# Patient Record
Sex: Female | Born: 1950 | Race: White | Hispanic: No | Smoking: Former smoker
Health system: Southern US, Community
[De-identification: ages and names within clinical notes are randomized; demographics above are authoritative.]

## PROBLEM LIST (undated history)

## (undated) DIAGNOSIS — I10 Essential (primary) hypertension: Secondary | ICD-10-CM

## (undated) DIAGNOSIS — R519 Headache, unspecified: Secondary | ICD-10-CM

## (undated) DIAGNOSIS — Z789 Other specified health status: Secondary | ICD-10-CM

## (undated) DIAGNOSIS — C801 Malignant (primary) neoplasm, unspecified: Secondary | ICD-10-CM

## (undated) DIAGNOSIS — I509 Heart failure, unspecified: Secondary | ICD-10-CM

## (undated) DIAGNOSIS — M419 Scoliosis, unspecified: Secondary | ICD-10-CM

## (undated) DIAGNOSIS — I4891 Unspecified atrial fibrillation: Secondary | ICD-10-CM

## (undated) DIAGNOSIS — R51 Headache: Secondary | ICD-10-CM

## (undated) DIAGNOSIS — Z8669 Personal history of other diseases of the nervous system and sense organs: Secondary | ICD-10-CM

## (undated) HISTORY — DX: Unspecified atrial fibrillation: I48.91

## (undated) HISTORY — DX: Personal history of other diseases of the nervous system and sense organs: Z86.69

## (undated) HISTORY — PX: CARDIAC CATHETERIZATION: SHX172

## (undated) HISTORY — DX: Scoliosis, unspecified: M41.9

## (undated) HISTORY — DX: Heart failure, unspecified: I50.9

## (undated) HISTORY — DX: Malignant (primary) neoplasm, unspecified: C80.1

---

## 1999-04-29 ENCOUNTER — Other Ambulatory Visit: Admission: RE | Admit: 1999-04-29 | Discharge: 1999-04-29 | Payer: Self-pay | Admitting: Obstetrics & Gynecology

## 2000-06-17 ENCOUNTER — Other Ambulatory Visit: Admission: RE | Admit: 2000-06-17 | Discharge: 2000-06-17 | Payer: Self-pay | Admitting: Obstetrics & Gynecology

## 2003-04-11 ENCOUNTER — Other Ambulatory Visit: Admission: RE | Admit: 2003-04-11 | Discharge: 2003-04-11 | Payer: Self-pay | Admitting: Obstetrics & Gynecology

## 2003-09-23 HISTORY — PX: OOPHORECTOMY: SHX86

## 2004-09-30 ENCOUNTER — Other Ambulatory Visit: Admission: RE | Admit: 2004-09-30 | Discharge: 2004-09-30 | Payer: Self-pay | Admitting: Obstetrics & Gynecology

## 2004-10-09 ENCOUNTER — Ambulatory Visit (HOSPITAL_COMMUNITY): Admission: RE | Admit: 2004-10-09 | Discharge: 2004-10-09 | Payer: Self-pay | Admitting: Obstetrics & Gynecology

## 2005-01-06 ENCOUNTER — Ambulatory Visit (HOSPITAL_COMMUNITY): Admission: RE | Admit: 2005-01-06 | Discharge: 2005-01-06 | Payer: Self-pay | Admitting: Obstetrics & Gynecology

## 2006-02-09 ENCOUNTER — Emergency Department: Payer: Self-pay | Admitting: General Practice

## 2006-02-12 ENCOUNTER — Emergency Department: Payer: Self-pay | Admitting: Emergency Medicine

## 2006-02-16 ENCOUNTER — Emergency Department: Payer: Self-pay | Admitting: Emergency Medicine

## 2006-02-17 ENCOUNTER — Emergency Department: Payer: Self-pay | Admitting: Unknown Physician Specialty

## 2006-02-23 ENCOUNTER — Emergency Department: Payer: Self-pay | Admitting: Emergency Medicine

## 2006-03-02 ENCOUNTER — Emergency Department: Payer: Self-pay | Admitting: Unknown Physician Specialty

## 2009-03-14 DIAGNOSIS — D126 Benign neoplasm of colon, unspecified: Secondary | ICD-10-CM | POA: Insufficient documentation

## 2009-07-10 DIAGNOSIS — Z8639 Personal history of other endocrine, nutritional and metabolic disease: Secondary | ICD-10-CM | POA: Insufficient documentation

## 2011-10-05 ENCOUNTER — Emergency Department: Payer: Self-pay | Admitting: Emergency Medicine

## 2011-10-05 LAB — COMPREHENSIVE METABOLIC PANEL
Albumin: 3.9 g/dL (ref 3.4–5.0)
Anion Gap: 9 (ref 7–16)
Calcium, Total: 8.8 mg/dL (ref 8.5–10.1)
Chloride: 106 mmol/L (ref 98–107)
EGFR (African American): >60
Glucose: 110 mg/dL — ABNORMAL HIGH (ref 65–99)
Osmolality: 284 (ref 275–301)
Potassium: 3.7 mmol/L (ref 3.5–5.1)
Sodium: 141 mmol/L (ref 136–145)
Total Protein: 7.3 g/dL (ref 6.4–8.2)

## 2011-10-05 LAB — CBC
Platelet: 268 10*3/uL (ref 150–440)
RBC: 4.34 10*6/uL (ref 3.80–5.20)
RDW: 13.3 % (ref 11.5–14.5)
WBC: 6.3 10*3/uL (ref 3.6–11.0)

## 2011-12-09 ENCOUNTER — Other Ambulatory Visit: Payer: Self-pay

## 2017-01-01 ENCOUNTER — Other Ambulatory Visit: Payer: Self-pay | Admitting: Gastroenterology

## 2017-02-20 HISTORY — PX: CATARACT EXTRACTION: SUR2

## 2017-02-26 ENCOUNTER — Encounter (HOSPITAL_COMMUNITY): Payer: Self-pay

## 2017-03-03 ENCOUNTER — Encounter (HOSPITAL_COMMUNITY)
Admission: RE | Disposition: A | Discharge status: Home / Self Care | Payer: Self-pay | Source: Ambulatory Visit | Attending: Gastroenterology

## 2017-03-03 ENCOUNTER — Encounter (HOSPITAL_COMMUNITY): Payer: Self-pay | Admitting: *Deleted

## 2017-03-03 ENCOUNTER — Ambulatory Visit (HOSPITAL_COMMUNITY)
Admission: RE | Admit: 2017-03-03 | Discharge: 2017-03-03 | Disposition: A | Discharge status: Home / Self Care | Payer: Medicare HMO | Source: Ambulatory Visit | Attending: Gastroenterology | Admitting: Gastroenterology

## 2017-03-03 ENCOUNTER — Ambulatory Visit (HOSPITAL_COMMUNITY): Payer: Medicare HMO | Admitting: Anesthesiology

## 2017-03-03 DIAGNOSIS — Z888 Allergy status to other drugs, medicaments and biological substances status: Secondary | ICD-10-CM | POA: Insufficient documentation

## 2017-03-03 DIAGNOSIS — Z79899 Other long term (current) drug therapy: Secondary | ICD-10-CM | POA: Insufficient documentation

## 2017-03-03 DIAGNOSIS — Z880 Allergy status to penicillin: Secondary | ICD-10-CM | POA: Diagnosis not present

## 2017-03-03 DIAGNOSIS — Z1211 Encounter for screening for malignant neoplasm of colon: Secondary | ICD-10-CM | POA: Insufficient documentation

## 2017-03-03 DIAGNOSIS — D120 Benign neoplasm of cecum: Secondary | ICD-10-CM | POA: Insufficient documentation

## 2017-03-03 DIAGNOSIS — D122 Benign neoplasm of ascending colon: Secondary | ICD-10-CM | POA: Insufficient documentation

## 2017-03-03 DIAGNOSIS — Z86010 Personal history of colonic polyps: Secondary | ICD-10-CM | POA: Insufficient documentation

## 2017-03-03 DIAGNOSIS — Z87891 Personal history of nicotine dependence: Secondary | ICD-10-CM | POA: Insufficient documentation

## 2017-03-03 DIAGNOSIS — Z881 Allergy status to other antibiotic agents status: Secondary | ICD-10-CM | POA: Diagnosis not present

## 2017-03-03 HISTORY — DX: Headache: R51

## 2017-03-03 HISTORY — DX: Other specified health status: Z78.9

## 2017-03-03 HISTORY — PX: COLONOSCOPY WITH PROPOFOL: SHX5780

## 2017-03-03 HISTORY — DX: Headache, unspecified: R51.9

## 2017-03-03 SURGERY — COLONOSCOPY WITH PROPOFOL
Anesthesia: Monitor Anesthesia Care

## 2017-03-03 MED ORDER — PROPOFOL 10 MG/ML IV BOLUS
INTRAVENOUS | Status: AC
  Filled 2017-03-03: qty 40

## 2017-03-03 MED ORDER — PROPOFOL 500 MG/50ML IV EMUL
INTRAVENOUS | Status: DC | PRN
Start: 2017-03-03 — End: 2017-03-03
  Administered 2017-03-03: 200 ug/kg/min via INTRAVENOUS

## 2017-03-03 MED ORDER — ONDANSETRON HCL 4 MG/2ML IJ SOLN
INTRAMUSCULAR | Status: DC | PRN
  Administered 2017-03-03: 4 mg via INTRAVENOUS

## 2017-03-03 MED ORDER — SODIUM CHLORIDE 0.9 % IV SOLN: INTRAVENOUS | Status: DC

## 2017-03-03 MED ORDER — ONDANSETRON HCL 4 MG/2ML IJ SOLN
INTRAMUSCULAR | Status: AC
  Filled 2017-03-03: qty 2

## 2017-03-03 MED ORDER — LACTATED RINGERS IV SOLN
INTRAVENOUS | Status: DC
  Administered 2017-03-03: 11:00:00 via INTRAVENOUS

## 2017-03-03 MED ORDER — PROPOFOL 10 MG/ML IV BOLUS
INTRAVENOUS | Status: DC | PRN
  Administered 2017-03-03: 20 mg via INTRAVENOUS
  Administered 2017-03-03: 10 mg via INTRAVENOUS

## 2017-03-03 SURGICAL SUPPLY — 21 items

## 2017-03-03 NOTE — Anesthesia Preprocedure Evaluation (Signed)
Anesthesia Evaluation  Patient identified by MRN, date of birth, ID band Patient awake    Reviewed: Allergy & Precautions, NPO status , Patient's Chart, lab work & pertinent test results  Airway Mallampati: II  TM Distance: >3 FB Neck ROM: Full    Dental no notable dental hx.    Pulmonary neg pulmonary ROS, former smoker,    Pulmonary exam normal breath sounds clear to auscultation       Cardiovascular negative cardio ROS Normal cardiovascular exam Rhythm:Regular Rate:Normal     Neuro/Psych negative neurological ROS  negative psych ROS   GI/Hepatic negative GI ROS, Neg liver ROS,   Endo/Other  negative endocrine ROS  Renal/GU negative Renal ROS  negative genitourinary   Musculoskeletal negative musculoskeletal ROS (+)   Abdominal   Peds negative pediatric ROS (+)  Hematology negative hematology ROS (+)   Anesthesia Other Findings   Reproductive/Obstetrics negative OB ROS                             Anesthesia Physical Anesthesia Plan  ASA: I  Anesthesia Plan: MAC   Post-op Pain Management:    Induction: Intravenous  PONV Risk Score and Plan:   Airway Management Planned: Nasal Cannula  Additional Equipment:   Intra-op Plan:   Post-operative Plan:   Informed Consent: I have reviewed the patients History and Physical, chart, labs and discussed the procedure including the risks, benefits and alternatives for the proposed anesthesia with the patient or authorized representative who has indicated his/her understanding and acceptance.   Dental advisory given  Plan Discussed with: CRNA and Surgeon  Anesthesia Plan Comments:         Anesthesia Quick Evaluation

## 2017-03-03 NOTE — Op Note (Signed)
[facility] Patient Name: Doris [name] Procedure Date: 03/03/2017 MRN: [id number] Attending MD: Garlan Fair , MD Date of Birth: [birth year] CSN: [id number] Age: 66 Admit Type: Outpatient Procedure:                Colonoscopy Indications:              High risk colon cancer surveillance: 2010                            colonoscopy was performed with removal of a 3 mm                            adenomatous colon polyp. 05/19/2012 normal                            surveillance colonoscopy was performed. Providers:                Garlan Fair, MD, Burtis Junes, RN, Tinnie Gens,                            Technician Referring MD:              Medicines:                Propofol per Anesthesia Complications:            No immediate complications. Estimated Blood Loss:     Estimated blood loss was minimal. Procedure:                Pre-Anesthesia Assessment:                           - Prior to the procedure, a History and Physical                            was performed, and patient medications and                            allergies were reviewed. The patient's tolerance of                            previous anesthesia was also reviewed. The risks                            and benefits of the procedure and the sedation                            options and risks were discussed with the patient.                            All questions were answered, and informed consent                            was obtained. Prior Anticoagulants: The patient has  taken no previous anticoagulant or antiplatelet                            agents. ASA Grade Assessment: II - A patient with                            mild systemic disease. After reviewing the risks                            and benefits, the patient was deemed in                            satisfactory condition to undergo the procedure.                           After obtaining  informed consent, the colonoscope                            was passed under direct vision. Throughout the                            procedure, the patient's blood pressure, pulse, and                            oxygen saturations were monitored continuously. The                            EC-[id number] ([id number]) scope was introduced through                            the anus and advanced to the the cecum, identified                            by appendiceal orifice and ileocecal valve. The                            colonoscopy was technically difficult and complex                            due to significant looping. The patient tolerated                            the procedure well. The quality of the bowel                            preparation was good. The appendiceal orifice and                            the rectum were photographed. Scope In: 11:31:12 AM Scope Out: 11:57:06 AM Scope Withdrawal Time: 0 hours 11 minutes 14 seconds  Total Procedure Duration: 0 hours 25 minutes 54 seconds  Findings:      The perianal and digital rectal examinations were normal.      A 4 mm polyp was found  in the cecum. The polyp was sessile. The polyp       was removed with a cold snare. Resection and retrieval were complete.      A 7 mm sessile serrated appearing polyp was found in the mid ascending       colon. The polyp was removed with a cold snare. Resection and retrieval       were complete.      The exam was otherwise without abnormality. Impression:               - One 4 mm polyp in the cecum, removed with a cold                            snare. Resected and retrieved.                           - One 7 mm polyp in the mid ascending colon,                            removed with a cold snare. Resected and retrieved.                           - The examination was otherwise normal. Moderate Sedation:      N/A- Per Anesthesia Care Recommendation:           - Patient has a contact number  available for                            emergencies. The signs and symptoms of potential                            delayed complications were discussed with the                            patient. Return to normal activities tomorrow.                            Written discharge instructions were provided to the                            patient.                           - Repeat colonoscopy date to be determined after                            pending pathology results are reviewed for                            surveillance.                           - Resume previous diet.                           - Continue present medications. Procedure Code(s):        ---  Professional ---                           [phone], Colonoscopy, flexible; with removal of                            tumor(s), polyp(s), or other lesion(s) by snare                            technique Diagnosis Code(s):        --- Professional ---                           Z86.010, Personal history of colonic polyps                           D12.0, Benign neoplasm of cecum                           D12.2, Benign neoplasm of ascending colon CPT copyright 2016 American Medical Association. All rights reserved. The codes documented in this report are preliminary and upon coder review may  be revised to meet current compliance requirements. Earle Gell, MD Garlan Fair, MD 03/03/2017 12:04:33 PM This report has been signed electronically. Number of Addenda: 0

## 2017-03-03 NOTE — Transfer of Care (Signed)
Immediate Anesthesia Transfer of Care Note  Patient: Doris [name]  Procedure(s) Performed: Procedure(s): COLONOSCOPY WITH PROPOFOL (N/A)  Patient Location: PACU  Anesthesia Type:MAC  Level of Consciousness:  sedated, patient cooperative and responds to stimulation  Airway & Oxygen Therapy:Patient Spontanous Breathing and Patient connected to face mask oxgen  Post-op Assessment:  Report given to PACU RN and Post -op Vital signs reviewed and stable  Post vital signs:  Reviewed and stable  Last Vitals:  Vitals:   03/03/17 1035  BP: 124/62  Pulse: 78  Resp: 15  Temp: 58.6 C    Complications: No apparent anesthesia complications

## 2017-03-03 NOTE — Discharge Instructions (Signed)
Colonoscopy, Adult, Care After This sheet gives you information about how to care for yourself after your procedure. Your doctor may also give you more specific instructions. If you have problems or questions, call your doctor. Follow these instructions at home: General instructions   For the first 24 hours after the procedure: ? Do not drive or use machinery. ? Do not sign important documents. ? Do not drink alcohol. ? Do your daily activities more slowly than normal. ? Eat foods that are soft and easy to digest. ? Rest often.  Take over-the-counter or prescription medicines only as told by your doctor.  It is up to you to get the results of your procedure. Ask your doctor, or the department performing the procedure, when your results will be ready. To help cramping and bloating:  Try walking around.  Put heat on your belly (abdomen) as told by your doctor. Use a heat source that your doctor recommends, such as a moist heat pack or a heating pad. ? Put a towel between your skin and the heat source. ? Leave the heat on for 20-30 minutes. ? Remove the heat if your skin turns bright red. This is especially important if you cannot feel pain, heat, or cold. You can get burned. Eating and drinking  Drink enough fluid to keep your pee (urine) clear or pale yellow.  Return to your normal diet as told by your doctor. Avoid heavy or fried foods that are hard to digest.  Avoid drinking alcohol for as long as told by your doctor. Contact a doctor if:  You have blood in your poop (stool) 2-3 days after the procedure. Get help right away if:  You have more than a small amount of blood in your poop.  You see large clumps of tissue (blood clots) in your poop.  Your belly is swollen.  You feel sick to your stomach (nauseous).  You throw up (vomit).  You have a fever.  You have belly pain that gets worse, and medicine does not help your pain. This information is not intended to  replace advice given to you by your health care provider. Make sure you discuss any questions you have with your health care provider. Document Released: 10/11/2010 Document Revised: 06/02/2016 Document Reviewed: 06/02/2016 Elsevier Interactive Patient Education  2017 [place] HAD AN ENDOSCOPIC PROCEDURE TODAY: Refer to the procedure report and other information in the discharge instructions given to you for any specific questions about what was found during the examination. If this information does not answer your questions, please call Dr. Wynetta Emery office at [phone] to clarify.   YOU SHOULD EXPECT: Some feelings of bloating in the abdomen. Passage of more gas than usual. Walking can help get rid of the air that was put into your GI tract during the procedure and reduce the bloating. If you had a lower endoscopy (such as a colonoscopy or flexible sigmoidoscopy) you may notice spotting of blood in your stool or on the toilet paper. Some abdominal soreness may be present for a day or two, also.  DIET: Your first meal following the procedure should be a light meal and then it is ok to progress to your normal diet. A half-sandwich or bowl of soup is an example of a good first meal. Heavy or fried foods are harder to digest and may make you feel nauseous or bloated. Drink plenty of fluids but you should avoid alcoholic beverages for 24 hours. If you had a esophageal dilation, please  see attached instructions for diet.   ACTIVITY: Your care partner should take you home directly after the procedure. You should plan to take it easy, moving slowly for the rest of the day. You can resume normal activity the day after the procedure however YOU SHOULD NOT DRIVE, use power tools, machinery or perform tasks that involve climbing or major physical exertion for 24 hours (because of the sedation medicines used during the test).   SYMPTOMS TO REPORT IMMEDIATELY: A gastroenterologist can be reached at any  hour. Please call [phone] for any of the following symptoms:  Following lower endoscopy (colonoscopy, flexible sigmoidoscopy) Excessive amounts of blood in the stool  Significant tenderness, worsening of abdominal pains  Swelling of the abdomen that is new, acute  Fever of 100 or higher    FOLLOW UP:  If any biopsies were taken you will be contacted by phone or by letter within the next 1-3 weeks. Call [phone]  if you have not heard about the biopsies in 3 weeks.  Please also call with any specific questions about appointments or follow up tests.

## 2017-03-03 NOTE — Anesthesia Postprocedure Evaluation (Signed)
Anesthesia Post Note  Patient: Doris [name]  Procedure(s) Performed: Procedure(s) (LRB): COLONOSCOPY WITH PROPOFOL (N/A)     Patient location during evaluation: PACU Anesthesia Type: MAC Level of consciousness: awake and alert Pain management: pain level controlled Vital Signs Assessment: post-procedure vital signs reviewed and stable Respiratory status: spontaneous breathing, nonlabored ventilation, respiratory function stable and patient connected to nasal cannula oxygen Cardiovascular status: stable and blood pressure returned to baseline Anesthetic complications: no    Last Vitals:  Vitals:   03/03/17 1220 03/03/17 1230  BP: 103/60 (!) 112/55  Pulse: 63 (!) 59  Resp: 14 16  Temp:      Last Pain:  Vitals:   03/03/17 1201  TempSrc: Oral                 [name]

## 2017-03-03 NOTE — H&P (Signed)
Procedure: Surveillance colonoscopy. 05/19/2012 normal surveillance colonoscopy was performed. 2010 colonoscopy was performed with removal of a 3 mm adenomatous colon polyp  History: The patient is a 66 year old female born 27-Dec-1950. She is scheduled to undergo a surveillance colonoscopy today.  Medication allergies: Penicillin. Metronidazole. Biaxin. Lidocaine.  Past medical history: Bilateral salpingo-oophorectomy. Migraine headache syndrome.  Exam: The patient is lying comfortably on the endoscopy stretcher. Cardiac exam reveals a regular rhythm. Lungs are clear to auscultation. Abdomen is soft and nontender to palpation.  Plan: Proceed with surveillance colonoscopy

## 2017-03-04 ENCOUNTER — Encounter (HOSPITAL_COMMUNITY): Payer: Self-pay | Admitting: Gastroenterology

## 2017-12-17 ENCOUNTER — Encounter: Payer: Self-pay | Admitting: Physical Therapy

## 2017-12-17 ENCOUNTER — Other Ambulatory Visit: Payer: Self-pay

## 2017-12-17 ENCOUNTER — Ambulatory Visit: Payer: Medicare HMO | Attending: Obstetrics and Gynecology | Admitting: Physical Therapy

## 2017-12-17 DIAGNOSIS — M4125 Other idiopathic scoliosis, thoracolumbar region: Secondary | ICD-10-CM

## 2017-12-17 DIAGNOSIS — M25511 Pain in right shoulder: Secondary | ICD-10-CM | POA: Diagnosis present

## 2017-12-17 DIAGNOSIS — M533 Sacrococcygeal disorders, not elsewhere classified: Secondary | ICD-10-CM | POA: Diagnosis not present

## 2017-12-17 DIAGNOSIS — M2170 Unequal limb length (acquired), unspecified site: Secondary | ICD-10-CM

## 2017-12-17 DIAGNOSIS — G8929 Other chronic pain: Secondary | ICD-10-CM | POA: Diagnosis present

## 2017-12-17 NOTE — Therapy (Addendum)
[place] [facility]IN [facility] SERVICES [address] [place], [place], [address] Phone: [phone]   Fax:  [phone]  Physical Therapy Evaluation  Patient Details  Name: Doris [name] MRN: [id number] Date of Birth: 11/26/1950 Referring Provider: Sharlett Iles    Encounter Date: 12/17/2017  PT End of Session - 12/17/17 2322    Visit Number  1    Number of Visits  12    PT Start Time  5361    PT Stop Time  1217    PT Time Calculation (min)  62 min    Activity Tolerance  Patient tolerated treatment well;No increased pain    Behavior During Therapy  WFL for tasks assessed/performed       Past Medical History:  Diagnosis Date  . Cancer (HCC)    Basal cell ( bridge of her nose)   . Headache    Migraines  . Hx of migraine headaches   . Medical history non-contributory    Mitral valve prolapse at 60 yrs of age    Past Surgical History:  Procedure Laterality Date  . CATARACT EXTRACTION  02/2017  . COLONOSCOPY WITH PROPOFOL N/A 03/03/2017   Procedure: COLONOSCOPY WITH PROPOFOL;  Surgeon: Garlan Fair, MD;  Location: [facility] ENDOSCOPY;  Service: Endoscopy;  Laterality: N/A;  . OOPHORECTOMY  2005    There were no vitals filed for this visit.   Subjective Assessment - 12/17/17 1127    Subjective Pelvic pain began  a year ago July 2018  when she was dragging a bunch of leaves in her garden. pain in the R bottom glut area and R groin 4/10 and currently, 3/10.  Pt was misdiagnosed by two orthopedist and 1 physical therapist.  Pt saw a Pelvic PT March 2019 for one visit. Pt sought out Pelvic PT here at [facility] because she did not want to travel 1 hour away each way through an internet search.  Sometimes the pain travels down the R posterior mid thigh after doing PT exercise her last Pelvic PT gave her. Currently this pain is pretty constant and sometimes it gets better during the day. It is there 85% of the time. Aggravating factors:  climbing a ladder,  digging, walking more than 1 mi .  Easing factors: masturbation, wearing the right shoes. This pain no longer wakes her up at night. Hobbie: gardening, walking 2 miles, swimming 1/2 mile    Urinary issue: Pt had difficulty with completely voiding after childbirth many years ago which involved an episiotomy. It has become worst after her gardening injury. It takes longer to start urination more recently within the last month or 6 weeks.  Sometimes she had low back pain prior to the injury. Urinary leakage occurs with sneezing.  Pt is doing kegels everyday 20 sec. Pt learned online how to do kegels.   Bowel issue: Pt noticed her bowels dramatically changed 3-4 weeks ago where sometimes, she could not completely empty her bowel movement without manually assisting. Sometimes, she would have to push the area to start bowel movements but in the last few days since she started changing her diet and doing some PT exercises.  Pt still has to strain to eliminate.  Denied blood in her stools, nausea, vomiting.   R shoulder pain: Pt has had this pain since her 51s.     Pertinent History    Patient Stated Goals  climbing a ladder. walking 2 miles          [facility]  PT Assessment - 12/17/17 1155      Assessment   Medical Diagnosis  levator spasm     Referring Provider  Sharlett Iles       Precautions   Precautions  None      Restrictions   Weight Bearing Restrictions  No      Observation/Other Assessments   Observations  slumped sitting and standing, posterior tilt of pelvis.         Single Leg Stance   Comments  difficulty balancing on R SLS (  Improved stability post Tx)       Strength   Overall Strength  -- decreased glut mm  bulk   Overall Strength Comments  hip ext B 5/5       Palpation   Spinal mobility  L shoulder lower than R R thoracic throacic convex curve, L lumbar curve with R iliac crest higher than L in standing,      SI assessment   L ASIS more higher in supine     Palpation comment  R  malleoli to ASIS 84 cm, L 81.3 cm, Coccyx flexed, tenderness on R base/ B sacrocccoccygeal ligaments                 No data recorded  Objective measurements completed on examination: See above findings.      [facility] Adult PT Treatment/Exercise - 12/17/17 2256      Neuro Re-ed    Neuro Re-ed Details   see pt instructions       Manual Therapy   Manual therapy comments  superior glide B sacrococcygeal ligament              PT Education - 12/17/17 1224    Education provided  Yes    Education Details  POC,           PT Long Term Goals - 12/17/17 2259      PT LONG TERM GOAL #1   Title  Pt will decrease her NIH-CPSI score from 67% to < 50% in order to demo improved pelvic floor function and fully complete urination with no difficulty    Time  12    Period  Weeks    Status  New    Target Date  03/11/18      PT LONG TERM GOAL #2   Title  Pt will demo less flexed coccyx and no tenderness on B sacrococcygeal ligaments across 2 visits in order to climb a ladder and have less constipation     Time  4    Period  Weeks    Status  New    Target Date  01/14/18      PT LONG TERM GOAL #3   Title  Pt will be IND with scoliosis specific exercises in order to minimize relapse of Sx    Time  8    Period  Weeks    Status  New    Target Date  02/11/18      PT LONG TERM GOAL #4   Title  Pt will demo proper body mechanics with gardening, floor <> stand transfers, and alignment with deep core/ thoracolumbar/ lower kinetic chain strengthening with less cues in order to minimize risk for future injuries     Time  10    Period  Weeks    Status  New    Target Date  02/25/18      PT LONG TERM GOAL #5   Title  Pt will demo  levelled iliac crest in supine and stance across 2 visits in order to progress to deep core/ pelvic girdle strengthening     Time  2    Period  Weeks    Status  New    Target Date  12/31/17             Plan - 12/17/17 2323    Clinical Impression  Statement  Pt is 67 yo female who reports of pelvic floor dysfunction and R shoulder pain. Her pelvic pain/ posterior R glut pain that started 1 year ago after pulling leaves in her garden. Since then, pt has also noticed difficulty with completely urination had worsened. This urinary Sx had been a problem since childbirth during which she had an epsiotomy. For the past 3-4 weeks, pt has difficulty with completely emptying her bowel movement without manually assisting.  Pt has been misdiagnosed by multiple providers and had failed PT Tx. These Sx have limited her ability to climb a ladder, garden, and walk for > 2 mi, and also impacted her QOL. Pt also reported R shoulder pain which she has had for many years, unrelated to the gardening injury.   Pt's clinical presentation showed thoracolumbar scoliosis with associated leg length discrepancy ( R LE shorter) and pelvic obliquities. Pt also presented with a flexed coccyx, weakness to deep core, posterior tilt of her pelvis.  Suspect pt 's R chronic shoulder pain is likely related to her scoliosis. Plan to assess upper spine/ scapular motion and  pelvic floor  at upcoming visits.Following Tx today, pt's iliac crest were more aligned in standing after pt was provided a shoe lift in her R shoe. Manual Tx was applied on coccyx to promote more extension and pt reported less tenderness at B sacrococcgyeal ligaments and R base of coccyx post Tx. Pt will benefit from a regional interdependent approach given the impact of her scoliosis. Pt had not be informed about her scoliosis nor leg length difference in her past. Further communication with her providers would be beneficial.  Pelvic PT ( Dr. Mechele Claude),  Dr. Irish Elders ( [facility] MD) who was the first provider who suggested pt had a pelvic floor dysfunction, and with Dr. Sharlett Iles (urogynecologist), her referring provider.          Clinical Presentation  Evolving    Clinical Decision Making  High    Rehab  Potential  Good    Clinical Impairments Affecting Rehab Potential  flexed coccyx, pelvic obliquities, scoliosis, leg length difference, posterior tilt of pelvis,  Hx of 2 vaginal deliveries, misdiagnosed by multiple providers and failed PT Tx     PT Frequency  1x / week    PT Duration  12 weeks    PT Treatment/Interventions  Therapeutic activities;Neuromuscular re-education;Therapeutic exercise;Moist Heat;Gait training;Patient/family education;Manual techniques;Scar mobilization;Manual lymph drainage;Aquatic Therapy;Biofeedback;Traction;Electrical Stimulation;Energy conservation;Taping;Stair training;Balance training;Functional mobility training    Consulted and Agree with Plan of Care  Patient       Patient will benefit from skilled therapeutic intervention in order to improve the following deficits and impairments:  Abnormal gait, Decreased balance, Decreased endurance, Decreased mobility, Increased muscle spasms, Impaired sensation, Difficulty walking, Improper body mechanics, Decreased range of motion, Decreased activity tolerance, Decreased coordination, Decreased safety awareness, Decreased strength, Increased fascial restricitons, Impaired flexibility, Postural dysfunction, Pain, Decreased scar mobility  Visit Diagnosis: Sacrococcygeal disorders, not elsewhere classified  Leg length difference, acquired  Other idiopathic scoliosis, thoracolumbar region  Chronic right shoulder pain     Problem List There are  no active problems to display for this patient.   Jerl Mina ,PT, DPT, E-RYT  12/18/2017, 12:03 AM  [place] MAIN [facility] SERVICES [address] [place], [place], [address] Phone: [phone]   Fax:  [phone]  Name: HARLEI LEHRMANN MRN: [id number] Date of Birth: [birth year]

## 2017-12-17 NOTE — Patient Instructions (Signed)
     Frog stretch: laying on belly with pillow under hips, knees bent, inhale do nothing, exhale let ankles fall apart   15 reps x 2 x day    childs pose rocking, toes tucked under  5 reps

## 2017-12-23 ENCOUNTER — Ambulatory Visit: Payer: Medicare HMO | Attending: Obstetrics and Gynecology | Admitting: Physical Therapy

## 2017-12-23 DIAGNOSIS — M2170 Unequal limb length (acquired), unspecified site: Secondary | ICD-10-CM | POA: Diagnosis not present

## 2017-12-23 DIAGNOSIS — M533 Sacrococcygeal disorders, not elsewhere classified: Secondary | ICD-10-CM | POA: Diagnosis present

## 2017-12-23 DIAGNOSIS — M4125 Other idiopathic scoliosis, thoracolumbar region: Secondary | ICD-10-CM

## 2017-12-23 DIAGNOSIS — G8929 Other chronic pain: Secondary | ICD-10-CM | POA: Insufficient documentation

## 2017-12-23 DIAGNOSIS — M25511 Pain in right shoulder: Secondary | ICD-10-CM | POA: Insufficient documentation

## 2017-12-23 NOTE — Patient Instructions (Addendum)
     Frog stretch: laying on belly with pillow under hips, knees bent, inhale do nothing, exhale let ankles fall apart 15 deg  15 reps x 3    _______      Standing:  10 reps on both sides x 3 x day     3 point tap   Feet are hip width Tap forward, center under hip not feet next to each other  Tap middle\, center  Tap back      _ Sitting:  No more slouching  Feet on floor  Sitting on sitting bones ( ischial tuberosities)  Use pelvic tilts to find pelvic neutral and not tailbone tucked under   _  Standing posture  Shift navel forward slightly, shift weight across heel and ball mound, unlock knees  Neutral spine and pelvic alignment (like zipping a zipper)    Laying on your back:  find pelvic neutral by bridging up first , then lower ribs and lastly, your pelvis    _____  Swimming: modify with more back stroke, breast stroke and less freestyle   Walking on land 15 min x 2 at different time of the day

## 2017-12-23 NOTE — Therapy (Signed)
[place] MAIN [facility] SERVICES [address] [place], [place], [address] Phone: [phone]   Fax:  [phone]  Physical Therapy Treatment  Patient Details  Name: Doris [name] MRN: [id number] Date of Birth: [birth year] Referring Provider: Sharlett Iles    Encounter Date: 12/23/2017  PT End of Session - 12/23/17 1022    Visit Number  2    Number of Visits  12    PT Start Time  7106    PT Stop Time  2694    PT Time Calculation (min)  63 min    Activity Tolerance  Patient tolerated treatment well;No increased pain    Behavior During Therapy  WFL for tasks assessed/performed       Past Medical History:  Diagnosis Date  . Cancer (HCC)    Basal cell ( bridge of her nose)   . Headache    Migraines  . Hx of migraine headaches   . Medical history non-contributory    Mitral valve prolapse at 58 yrs of age    Past Surgical History:  Procedure Laterality Date  . CATARACT EXTRACTION  02/2017  . COLONOSCOPY WITH PROPOFOL N/A 03/03/2017   Procedure: COLONOSCOPY WITH PROPOFOL;  Surgeon: Garlan Fair, MD;  Location: [facility];  Service: Endoscopy;  Laterality: N/A;  . OOPHORECTOMY  2005    There were no vitals filed for this visit.  Subjective Assessment - 12/23/17 0913    Subjective  Pt reported her tailbone pain felt good after last session and went to rake leaves in her garden. After raking her leaves, pt felt pain after the end of raking.      Pertinent History  Denied blood in her stools, nausea, vomiting. This pain no longer wakes her up at night. Hobbie: gardening, walking 2 miles, swimming 1/2 mile     Patient Stated Goals  climbing a ladder. walking 2 miles          [facility] PT Assessment - 12/23/17 2048      Observation/Other Assessments   Observations  adducted genu hallus B       Strength   Overall Strength Comments  prone: hip ext R 4-/5, L 5/5       Palpation   SI assessment   ASIS equal in supine    Palpation  comment  coccyx flexed, increased tightenss/ tenderness at obt int and medial aspect of ischial tuberosity. referred to groin with palpation at lateral to coccyx        simulated raking : excessive trunk flexion , minimal LE use             [facility] Adult PT Treatment/Exercise - 12/23/17 2048      Neuro Re-ed    Neuro Re-ed Details   see pt instructions       Manual Therapy   Manual therapy comments  superior glide B sacrococcygeal ligament, STM at obt int/ medial ischial tubosity      Kinesiotex  -- 3 strips from coccyx              PT Education - 12/23/17 1022    Education provided  Yes    Education Details  HEP, body mechanics with raking    Person(s) Educated  Patient    Methods  Explanation;Demonstration;Tactile cues;Verbal cues;Handout    Comprehension  Returned demonstration;Verbalized understanding;Verbal cues required;Tactile cues required          PT Long Term Goals - 12/23/17 2052  PT LONG TERM GOAL #1   Title  Pt will decrease her NIH-CPSI score from 67% to < 50% in order to demo improved pelvic floor function and fully complete urination with no difficulty    Time  12    Period  Weeks    Status  New      PT LONG TERM GOAL #2   Title  Pt will demo less flexed coccyx and no tenderness on B sacrococcygeal ligaments across 2 visits in order to climb a ladder and have less constipation     Time  4    Period  Weeks    Status  New      PT LONG TERM GOAL #3   Title  Pt will be IND with scoliosis specific exercises in order to minimize relapse of Sx    Time  8    Period  Weeks    Status  New      PT LONG TERM GOAL #4   Title  Pt will demo proper body mechanics with gardening, floor <> stand transfers, and alignment with deep core/ thoracolumbar/ lower kinetic chain strengthening with less cues in order to minimize risk for future injuries     Time  10    Period  Weeks    Status  New      PT LONG TERM GOAL #5   Title  Pt will demo levelled  iliac crest in supine and stance across 2 visits in order to progress to deep core/ pelvic girdle strengthening     Time  2    Period  Weeks    Status  New      Additional Long Term Goals   Additional Long Term Goals  Yes      PT LONG TERM GOAL #6   Title  Pt will demo no referred pain with palpation along lateral areas of coccyx and less flexed coccyx across 2 visits in order to perform from  floor to stand  to garden     Time  6    Period  Weeks    Status  New    Target Date  02/03/18            Plan - 12/23/17 2048    Clinical Impression Statement  Ptis progressing well  with good carry over from last session w/ equal alignment of her iliac crest with shoe lift and improved gait. After today's manual Tx which pt tolerated without complaints, pt showed a less flexed coccyx and less coccygeus/ obturator internus/ mm attachments to medial aspect of ischial tuberosities. Pt reported feeling better post Tx. Pt was educated on  proper sitting and standing posture to minimize posterior tilt of pelvis and proper body mechanics with raking with more BLE use/ less spinal flexion.  Pt demo'd correct posture. Pt continues to benefit from skilled PT.      Rehab Potential  Good    Clinical Impairments Affecting Rehab Potential  flexed coccyx, pelvic obliquities, scoliosis, leg length difference, posterior tilt of pelvis,  Hx of 2 vaginal deliveries, misdiagnosed by multiple providers and failed PT Tx     PT Frequency  1x / week    PT Duration  12 weeks    PT Treatment/Interventions  Therapeutic activities;Neuromuscular re-education;Therapeutic exercise;Moist Heat;Gait training;Patient/family education;Manual techniques;Scar mobilization;Manual lymph drainage;Aquatic Therapy;Biofeedback;Traction;Electrical Stimulation;Energy conservation;Taping;Stair training;Balance training;Functional mobility training    Consulted and Agree with Plan of Care  Patient       Patient will benefit from  skilled  therapeutic intervention in order to improve the following deficits and impairments:  Abnormal gait, Decreased balance, Decreased endurance, Decreased mobility, Increased muscle spasms, Impaired sensation, Difficulty walking, Improper body mechanics, Decreased range of motion, Decreased activity tolerance, Decreased coordination, Decreased safety awareness, Decreased strength, Increased fascial restricitons, Impaired flexibility, Postural dysfunction, Pain, Decreased scar mobility  Visit Diagnosis: Leg length difference, acquired  Other idiopathic scoliosis, thoracolumbar region  Sacrococcygeal disorders, not elsewhere classified  Chronic right shoulder pain     Problem List There are no active problems to display for this patient.   Jerl Mina ,PT, DPT, E-RYT  12/23/2017, 8:54 PM  [place] MAIN [facility] SERVICES [address] [place], [place], [address] Phone: [phone]   Fax:  [phone]  Name: KORDELIA SEVERIN MRN: [id number] Date of Birth: [birth year]

## 2017-12-29 ENCOUNTER — Ambulatory Visit: Payer: Medicare HMO | Admitting: Physical Therapy

## 2017-12-29 DIAGNOSIS — M4125 Other idiopathic scoliosis, thoracolumbar region: Secondary | ICD-10-CM

## 2017-12-29 DIAGNOSIS — G8929 Other chronic pain: Secondary | ICD-10-CM

## 2017-12-29 DIAGNOSIS — M25511 Pain in right shoulder: Secondary | ICD-10-CM

## 2017-12-29 DIAGNOSIS — M2170 Unequal limb length (acquired), unspecified site: Secondary | ICD-10-CM

## 2017-12-29 DIAGNOSIS — M533 Sacrococcygeal disorders, not elsewhere classified: Secondary | ICD-10-CM

## 2017-12-29 NOTE — Patient Instructions (Signed)
half kneeling transitions to and from the floor  Add mini standing lunges with hand on dresser or table  20 x reps   Start with ski track stance, step back with heel/ toes turned straight, front knee above ankle without moving as your back knee bends and straightens  Keep pelvis and trunk centered between legs without shifting more to the front legs    __ also the technique for picking up sticks            Transition from standing to floor: Wide squat like you are about to pick something up from the floor --> hands on the ground (all fours)  To get up, all fours--> lifts hips in to Downward Facing Dog  and walk hands backwards to feet --> mini quat --> hands on thighs, then hips then pause to avoid (moving too quickly up/ blood rush) -->  knees glide forward and roll  Hips up instead of hinging spine up   ______________   Scoliosis stretch  Wall : Stand perpendicular with R upper arm distance away  R forearm against the wall and slide up and down, R hip shifts to lengthen the lower R flank   10 x 2    Seated shoulder roll backwards 10 x 2  _______   Pelvic tilts ( anterior) tipping forward  ,while on toilet  Breathing 360 degrees at ribs

## 2017-12-30 NOTE — Therapy (Addendum)
[place] MAIN [facility] SERVICES [address] [place], [place], [address] Phone: [phone]   Fax:  [phone]  Physical Therapy Treatment  Patient Details  Name: Doris [name] MRN: [id number] Date of Birth: Jan 14, 1951 Referring Provider: Sharlett Iles    Encounter Date: 12/29/2017  PT End of Session - 12/30/17 0823    Visit Number  3    Number of Visits  12    PT Start Time  0805    PT Stop Time  0915    PT Time Calculation (min)  70 min    Activity Tolerance  Patient tolerated treatment well;No increased pain    Behavior During Therapy  WFL for tasks assessed/performed       Past Medical History:  Diagnosis Date  . Cancer (HCC)    Basal cell ( bridge of her nose)   . Headache    Migraines  . Hx of migraine headaches   . Medical history non-contributory    Mitral valve prolapse at 58 yrs of age    Past Surgical History:  Procedure Laterality Date  . CATARACT EXTRACTION  02/2017  . COLONOSCOPY WITH PROPOFOL N/A 03/03/2017   Procedure: COLONOSCOPY WITH PROPOFOL;  Surgeon: Garlan Fair, MD;  Location: [facility];  Service: Endoscopy;  Laterality: N/A;  . OOPHORECTOMY  2005    There were no vitals filed for this visit.  Subjective Assessment - 12/29/17 0912    Subjective  Pt reported she "feels like she is getting my old self back".  With bowel movements, she is not having to use her hands to pushthrough out the entire bowel movement. She needs to push only at the last bit of the bowel movement     Pertinent History  Denied blood in her stools, nausea, vomiting. This pain no longer wakes her up at night. Hobbie: gardening, walking 2 miles, swimming 1/2 mile     Patient Stated Goals  climbing a ladder. walking 2 miles          [facility] PT Assessment - 12/30/17 0817      Floor to Stand   Comments  mild difficulty, fast transition up, anterior COM on half kneeling      Palpation   Spinal mobility  R medial scap mm tightness,  B paraspinals, intercostals  mm     SI assessment   ASIS equal in supine      quarter coin size hard nodule noted at L armpit              [facility] Adult PT Treatment/Exercise - 12/30/17 0816      Neuro Re-ed    Neuro Re-ed Details   see pt instructions       Manual Therapy   Scapular Mobilization  STM R medial scapula STM releases                   PT Long Term Goals - 12/23/17 2052      PT LONG TERM GOAL #1   Title  Pt will decrease her NIH-CPSI score from 67% to < 50% in order to demo improved pelvic floor function and fully complete urination with no difficulty    Time  12    Period  Weeks    Status  New      PT LONG TERM GOAL #2   Title  Pt will demo less flexed coccyx and no tenderness on B sacrococcygeal ligaments across 2 visits in order to climb a  ladder and have less constipation     Time  4    Period  Weeks    Status  New      PT LONG TERM GOAL #3   Title  Pt will be IND with scoliosis specific exercises in order to minimize relapse of Sx    Time  8    Period  Weeks    Status  New      PT LONG TERM GOAL #4   Title  Pt will demo proper body mechanics with gardening, floor <> stand transfers, and alignment with deep core/ thoracolumbar/ lower kinetic chain strengthening with less cues in order to minimize risk for future injuries     Time  10    Period  Weeks    Status  New      PT LONG TERM GOAL #5   Title  Pt will demo levelled iliac crest in supine and stance across 2 visits in order to progress to deep core/ pelvic girdle strengthening     Time  2    Period  Weeks    Status  New      Additional Long Term Goals   Additional Long Term Goals  Yes      PT LONG TERM GOAL #6   Title  Pt will demo no referred pain with palpation along lateral areas of coccyx and less flexed coccyx across 2 visits in order to perform from  floor to stand  to garden     Time  6    Period  Weeks    Status  New    Target Date  02/03/18             Plan - 12/30/17 2841    Clinical Impression Statement  Pt is progressing well with report of return to walking and "feeling like her old self again". Pt demo'd improved coccyx and pelvic girdle alignment. Addressed scoliotic curve deficits at thoracic spine with increased mm tensions near R shoulder.  Initiated scapular stabilizing exercises and provided back stroke swimming technique training. Recommended pt to alternate between freestyle swimming with back stroke in order to minimize overuse of mm and optimize scapular stability. Pt demo'd correctly. Pt demo'd decreased mm tensions and mobility in her thoracic spsine post Tx. Addressed body mechanics with floor to stand t/f to apply with yardwork. Pt demo'd no difficulty with half kneeling technique but had mild difficulty with 4-point transfer from hands and feet to standing. Pt demo'd correctly following training.  Pt continues to benefit from skilled PT.     Noted quarter coin size hard nodule at L armpit. PT left a message at the office of  PCP on 01/01/18 ( Dr. [name])  re: this finding with request for assessment and imaging.      Rehab Potential  Good    Clinical Impairments Affecting Rehab Potential  flexed coccyx, pelvic obliquities, scoliosis, leg length difference, posterior tilt of pelvis,  Hx of 2 vaginal deliveries, misdiagnosed by multiple providers and failed PT Tx     PT Frequency  1x / week    PT Duration  12 weeks    PT Treatment/Interventions  Therapeutic activities;Neuromuscular re-education;Therapeutic exercise;Moist Heat;Gait training;Patient/family education;Manual techniques;Scar mobilization;Manual lymph drainage;Aquatic Therapy;Biofeedback;Traction;Electrical Stimulation;Energy conservation;Taping;Stair training;Balance training;Functional mobility training    Consulted and Agree with Plan of Care  Patient       Patient will benefit from skilled therapeutic intervention in order to improve the following  deficits and impairments:  Abnormal  gait, Decreased balance, Decreased endurance, Decreased mobility, Increased muscle spasms, Impaired sensation, Difficulty walking, Improper body mechanics, Decreased range of motion, Decreased activity tolerance, Decreased coordination, Decreased safety awareness, Decreased strength, Increased fascial restricitons, Impaired flexibility, Postural dysfunction, Pain, Decreased scar mobility  Visit Diagnosis: Leg length difference, acquired  Other idiopathic scoliosis, thoracolumbar region  Sacrococcygeal disorders, not elsewhere classified  Chronic right shoulder pain     Problem List There are no active problems to display for this patient.   Jerl Mina ,PT, DPT, E-RYT  12/30/2017, 10:53 AM  [place] MAIN [facility] SERVICES [address] [place], [place], [address] Phone: [phone]   Fax:  [phone]  Name: AUNICA DAUPHINEE MRN: [id number] Date of Birth: 1950-09-28

## 2018-01-01 ENCOUNTER — Telehealth: Payer: Self-pay | Admitting: Physical Therapy

## 2018-01-01 NOTE — Telephone Encounter (Signed)
PT left a message at Dr. Ammie Ferrier office ( PCP) re: hard nodule at L armpit and pt's Hx of basal cell carcinoma with request for pt to get an appt with him for further medical work up and imaging.    PT has been emailed re: communication and call office for follow up scheduling

## 2018-01-06 ENCOUNTER — Ambulatory Visit: Payer: Medicare HMO | Admitting: Physical Therapy

## 2018-01-06 DIAGNOSIS — G8929 Other chronic pain: Secondary | ICD-10-CM

## 2018-01-06 DIAGNOSIS — M4125 Other idiopathic scoliosis, thoracolumbar region: Secondary | ICD-10-CM

## 2018-01-06 DIAGNOSIS — M25511 Pain in right shoulder: Secondary | ICD-10-CM

## 2018-01-06 DIAGNOSIS — M2170 Unequal limb length (acquired), unspecified site: Secondary | ICD-10-CM | POA: Diagnosis not present

## 2018-01-06 DIAGNOSIS — M533 Sacrococcygeal disorders, not elsewhere classified: Secondary | ICD-10-CM

## 2018-01-06 NOTE — Therapy (Signed)
[place] MAIN [facility] SERVICES [address] [place], [place], [address] Phone: [phone]   Fax:  [phone]  Physical Therapy Treatment  Patient Details  Name: ETHYLENE REZNICK MRN: [id number] Date of Birth: 1951-05-09 Referring Provider: Sharlett Iles    Encounter Date: 01/06/2018  PT End of Session - 01/06/18 0915    Visit Number  4    Number of Visits  12    PT Start Time  0810    PT Stop Time  0913    PT Time Calculation (min)  63 min    Activity Tolerance  Patient tolerated treatment well;No increased pain    Behavior During Therapy  WFL for tasks assessed/performed       Past Medical History:  Diagnosis Date  . Cancer (HCC)    Basal cell ( bridge of her nose)   . Headache    Migraines  . Hx of migraine headaches   . Medical history non-contributory    Mitral valve prolapse at 89 yrs of age    Past Surgical History:  Procedure Laterality Date  . CATARACT EXTRACTION  02/2017  . COLONOSCOPY WITH PROPOFOL N/A 03/03/2017   Procedure: COLONOSCOPY WITH PROPOFOL;  Surgeon: Garlan Fair, MD;  Location: [facility];  Service: Endoscopy;  Laterality: N/A;  . OOPHORECTOMY  2005    There were no vitals filed for this visit.  Subjective Assessment - 01/06/18 0812    Subjective Pt reports she has had a whooshing sound in her L ear for the past 2 days. Pt has not notified any MDs about this new Sx but will do it. Pt has an echocardiogram ordered by her PCP scheduled on 4/24.  Pt has seen her PCP and a new dermatologist re: lump in her L armpit . Both reported not knowing what it is. Pt will f/u with her first dermotologist at [place] ( Dr. Valere Dross) because she has noticed it getting larger.  Pt slept on a pad on the  floor as a guest at a friend's house and had increased activities over the past weekend and now pt has felt the area by the R inner glut to be painful (3/10) but not to the degree it was prior to PT (4/10) .  Usually it goes  away but it has not.       Pertinent History  Denied blood in her stools, nausea, vomiting. This pain no longer wakes her up at night. Hobbie: gardening, walking 2 miles, swimming 1/2 mile     Patient Stated Goals  climbing a ladder. walking 2 miles          [facility] PT Assessment - 01/06/18 0914      Coordination   Gross Motor Movements are Fluid and Coordinated  -- chest breathing, excessive cues to correct dyscoordination                Pelvic Floor Special Questions - 01/06/18 0914    Pelvic Floor Internal Exam  Pt consented verbally without contraindications     Exam Type  Vaginal    Palpation  increased scar restrictions 2nd layer 9-6 o clock         [facility] Adult PT Treatment/Exercise - 01/06/18 2249      Neuro Re-ed    Neuro Re-ed Details   see pt instructions       Manual Therapy   Internal Pelvic Floor  scar release / perineal scar adhesion  scar restrictions 2nd layer 9-6 o  clock               PT Education - 01/06/18 0913    Education provided  Yes    Education Details  HEP    Person(s) Educated  Patient    Methods  Explanation;Tactile cues;Demonstration;Verbal cues;Handout    Comprehension  Returned demonstration;Verbalized understanding          PT Long Term Goals - 12/23/17 2052      PT LONG TERM GOAL #1   Title  Pt will decrease her NIH-CPSI score from 67% to < 50% in order to demo improved pelvic floor function and fully complete urination with no difficulty    Time  12    Period  Weeks    Status  New      PT LONG TERM GOAL #2   Title  Pt will demo less flexed coccyx and no tenderness on B sacrococcygeal ligaments across 2 visits in order to climb a ladder and have less constipation     Time  4    Period  Weeks    Status  New      PT LONG TERM GOAL #3   Title  Pt will be IND with scoliosis specific exercises in order to minimize relapse of Sx    Time  8    Period  Weeks    Status  New      PT LONG TERM GOAL #4   Title  Pt will  demo proper body mechanics with gardening, floor <> stand transfers, and alignment with deep core/ thoracolumbar/ lower kinetic chain strengthening with less cues in order to minimize risk for future injuries     Time  10    Period  Weeks    Status  New      PT LONG TERM GOAL #5   Title  Pt will demo levelled iliac crest in supine and stance across 2 visits in order to progress to deep core/ pelvic girdle strengthening     Time  2    Period  Weeks    Status  New      Additional Long Term Goals   Additional Long Term Goals  Yes      PT LONG TERM GOAL #6   Title  Pt will demo no referred pain with palpation along lateral areas of coccyx and less flexed coccyx across 2 visits in order to perform from  floor to stand  to garden     Time  6    Period  Weeks    Status  New    Target Date  02/03/18            Plan - 01/06/18 2253    Clinical Impression Statement Pt showed significantly decreased perineal scar restrictions post Tx today.  Most restrictions were located on R posterior pelvic floor mm which is likely related to her complaint of increased R glut pain after performing increased activities the past few days. Pt reported decreased pain post Tx. Initiated glut strengthening in closed kinetic chain today. Plan to continue with strengthening and add scoliosis specific HEP at upcoming sessions. Pt continues to benefit from skilled PT.   Additional medical update:  Pt reports she has had a whooshing sound in her L ear for the past 2 days. Pt has not notified any MDs about this new Sx but will do it. Pt has an echocardiogram ordered by her PCP scheduled on 4/24.  Pt has seen her PCP and  a new dermatologist re: lump in her L armpit . Both reported not knowing what it is. Pt will f/u with her first dermotologist at [place] ( Dr. Valere Dross) because she has noticed it getting larger.    Pt was educated to maintain communication with her specialists to f/u on the new cluster of Sx. Pt voiced  understanding.    Rehab Potential  Good    Clinical Impairments Affecting Rehab Potential  flexed coccyx, pelvic obliquities, scoliosis, leg length difference, posterior tilt of pelvis,  Hx of 2 vaginal deliveries, misdiagnosed by multiple providers and failed PT Tx     PT Frequency  1x / week    PT Duration  12 weeks    PT Treatment/Interventions  Therapeutic activities;Neuromuscular re-education;Therapeutic exercise;Moist Heat;Gait training;Patient/family education;Manual techniques;Scar mobilization;Manual lymph drainage;Aquatic Therapy;Biofeedback;Traction;Electrical Stimulation;Energy conservation;Taping;Stair training;Balance training;Functional mobility training    Consulted and Agree with Plan of Care  Patient       Patient will benefit from skilled therapeutic intervention in order to improve the following deficits and impairments:  Abnormal gait, Decreased balance, Decreased endurance, Decreased mobility, Increased muscle spasms, Impaired sensation, Difficulty walking, Improper body mechanics, Decreased range of motion, Decreased activity tolerance, Decreased coordination, Decreased safety awareness, Decreased strength, Increased fascial restricitons, Impaired flexibility, Postural dysfunction, Pain, Decreased scar mobility  Visit Diagnosis: Leg length difference, acquired  Other idiopathic scoliosis, thoracolumbar region  Sacrococcygeal disorders, not elsewhere classified  Chronic right shoulder pain     Problem List There are no active problems to display for this patient.   Jerl Mina ,PT, DPT, E-RYT  01/06/2018, 11:33 PM  [place] MAIN [facility] SERVICES [address] [place], [place], [address] Phone: [phone]   Fax:  [phone]  Name: SIMRANJIT THAYER MRN: [id number] Date of Birth: 07/07/1951

## 2018-01-06 NOTE — Patient Instructions (Addendum)
Deep core level 1 ( breathing)   logroling technique    Minisquat: Scoot buttocks back slight, hinge like you are looking at your reflection on a pond  Knees behind toes,  Inhale to "smell flowers"  Exhale on the rise "like rocket"  Do not lock knees, have more weight across ballmounds of feet, toes relaxed   10 reps x 3 x day

## 2018-01-13 ENCOUNTER — Ambulatory Visit: Payer: Medicare HMO | Admitting: Physical Therapy

## 2018-01-13 DIAGNOSIS — M4125 Other idiopathic scoliosis, thoracolumbar region: Secondary | ICD-10-CM

## 2018-01-13 DIAGNOSIS — M2170 Unequal limb length (acquired), unspecified site: Secondary | ICD-10-CM

## 2018-01-13 DIAGNOSIS — M25511 Pain in right shoulder: Secondary | ICD-10-CM

## 2018-01-13 DIAGNOSIS — M533 Sacrococcygeal disorders, not elsewhere classified: Secondary | ICD-10-CM

## 2018-01-13 DIAGNOSIS — G8929 Other chronic pain: Secondary | ICD-10-CM

## 2018-01-13 NOTE — Patient Instructions (Addendum)
Clams on forearm with top arm overhead  10 x 3 reps each side     _______   Segmental mobility of the spine:  Table top wrists under shoudlers, knees under hips  Cat: belly button draws up to ceiling, midback hunches, chin tuck Cow: chin away, shoulders blader sink, tailbone up    _____  Prone Heel Press for strengthening sacro-iliac joints  1. Lie on your belly. If you have an arch in your low back or it feels umcomfortable, place a pillow under your low belly/hips to make sure your low back feel comfortable.   2. Place our forehead on top of your palms.      Widen your knees apart for starting position.   3. Inhale, feel belly and low back expand  4. Exhale, feel belly hug in, press heel together and count aloud for 5 sec. Then relax the heel squeezing.  Perform 10 reps of 5 sec holds. 2 sets/ day.    If you feel entire buttock tighten too much or feel low back pain, apply 50% less effort. As you press your heel together, you will feel as if your pubic bone (front of your pelvis) and sacrum (back of your pelvis) gentle move towards each other or your low abdominal muscles hug in more.     ______   Floor to rise transfer with knee and heel in a V alignment knocked knees    _____  Stretches :-figure 4 stretch 5 breaths

## 2018-01-13 NOTE — Therapy (Signed)
[place] MAIN [facility] SERVICES [address] [place], [place], [address] Phone: [phone]   Fax:  [phone]  Physical Therapy Treatment  Patient Details  Name: Doris [name] MRN: [id number] Date of Birth: [birth year] Referring Provider: Sharlett Iles    Encounter Date: 01/13/2018  PT End of Session - 01/13/18 0850    Visit Number  5    Number of Visits  12    PT Start Time  0800    PT Stop Time  0905    PT Time Calculation (min)  65 min    Activity Tolerance  Patient tolerated treatment well;No increased pain    Behavior During Therapy  WFL for tasks assessed/performed       Past Medical History:  Diagnosis Date  . Cancer (HCC)    Basal cell ( bridge of her nose)   . Headache    Migraines  . Hx of migraine headaches   . Medical history non-contributory    Mitral valve prolapse at 64 yrs of age    Past Surgical History:  Procedure Laterality Date  . CATARACT EXTRACTION  02/2017  . COLONOSCOPY WITH PROPOFOL N/A 03/03/2017   Procedure: COLONOSCOPY WITH PROPOFOL;  Surgeon: Garlan Fair, MD;  Location: [facility];  Service: Endoscopy;  Laterality: N/A;  . OOPHORECTOMY  2005    There were no vitals filed for this visit.  Subjective Assessment - 01/13/18 0806    Subjective  Pt reported she mowed her lawn with a push mover, moved outdoor furniture, and picked up sticked with mini squat positions this weekend. Pt feels only a 1/10 pain in her tailbone . Pt had a slip and a fall onto her L hip as she was stepping L foot forward and R foot was planted in place.  Stools are still Type 1 but softer     Pertinent History  Denied blood in her stools, nausea, vomiting. This pain no longer wakes her up at night. Hobbie: gardening, walking 2 miles, swimming 1/2 mile     Patient Stated Goals  climbing a ladder. walking 2 miles          [facility] PT Assessment - 01/13/18 3419      Observation/Other Assessments   Observations  poor carry  over w/ floor to rise       Strength   Overall Strength Comments  hip abd L 4-/5, R 3+/5        Palpation   SI assessment   R iliac crest higher than L     Palpation comment  hypomobility at L SIJ , into hip ext,       Ambulation/Gait   Gait Comments  limping, decreased stance on L                    [facility] Adult PT Treatment/Exercise - 01/13/18 6222      Neuro Re-ed    Neuro Re-ed Details   see pt instructions, reviewed floor to stand with proper alignment of of knee and heel to minimize genu valgus        Manual Therapy   Manual therapy comments  L long axis distraction, AP mob FADDIR/ER Grade II -III mob, rotational mob . PA mob at lateral border of SIJ, superior mob into nutation, STM at glut med attachments at posterior superior ilic crest                   PT Long Term Goals -  12/23/17 2052      PT LONG TERM GOAL #1   Title  Pt will decrease her NIH-CPSI score from 67% to < 50% in order to demo improved pelvic floor function and fully complete urination with no difficulty    Time  12    Period  Weeks    Status  New      PT LONG TERM GOAL #2   Title  Pt will demo less flexed coccyx and no tenderness on B sacrococcygeal ligaments across 2 visits in order to climb a ladder and have less constipation     Time  4    Period  Weeks    Status  New      PT LONG TERM GOAL #3   Title  Pt will be IND with scoliosis specific exercises in order to minimize relapse of Sx    Time  8    Period  Weeks    Status  New      PT LONG TERM GOAL #4   Title  Pt will demo proper body mechanics with gardening, floor <> stand transfers, and alignment with deep core/ thoracolumbar/ lower kinetic chain strengthening with less cues in order to minimize risk for future injuries     Time  10    Period  Weeks    Status  New      PT LONG TERM GOAL #5   Title  Pt will demo levelled iliac crest in supine and stance across 2 visits in order to progress to deep core/ pelvic  girdle strengthening     Time  2    Period  Weeks    Status  New      Additional Long Term Goals   Additional Long Term Goals  Yes      PT LONG TERM GOAL #6   Title  Pt will demo no referred pain with palpation along lateral areas of coccyx and less flexed coccyx across 2 visits in order to perform from  floor to stand  to garden     Time  6    Period  Weeks    Status  New    Target Date  02/03/18            Plan - 01/13/18 0958    Clinical Impression Statement  Adjusted pelvic obliquities due to pt's slipping and falling onto hip recently with LLE forward.  Pt showed higher R iliac crest with hypomobility at L SIJ along with limping gait pre Tx. Post Tx, pt showed levelled pelvis, increased SIJ mobility and less pain. and improved gait. Pt also required excessive cues for floor <> rise t/f to minimize genu valgus as pt showed poor carry over from previous session.  Progressed pt to hip abductor/ER mm strengthening with scapular stability and pelvic tilt ( cat cow pose) HEP. Plan to add HEP with rotational component of her scoliosis. PT is having significantly less issues at her R neck and shoulder after last Tx that address her upper scoliotic curves. Pt continue to benefit skilled PT.      Rehab Potential  Good    Clinical Impairments Affecting Rehab Potential  flexed coccyx, pelvic obliquities, scoliosis, leg length difference, posterior tilt of pelvis,  Hx of 2 vaginal deliveries, misdiagnosed by multiple providers and failed PT Tx     PT Frequency  1x / week    PT Duration  12 weeks    PT Treatment/Interventions  Therapeutic activities;Neuromuscular re-education;Therapeutic exercise;Moist Heat;Gait  training;Patient/family education;Manual techniques;Scar mobilization;Manual lymph drainage;Aquatic Therapy;Biofeedback;Traction;Electrical Stimulation;Energy conservation;Taping;Stair training;Balance training;Functional mobility training    Consulted and Agree with Plan of Care  Patient        Patient will benefit from skilled therapeutic intervention in order to improve the following deficits and impairments:  Abnormal gait, Decreased balance, Decreased endurance, Decreased mobility, Increased muscle spasms, Impaired sensation, Difficulty walking, Improper body mechanics, Decreased range of motion, Decreased activity tolerance, Decreased coordination, Decreased safety awareness, Decreased strength, Increased fascial restricitons, Impaired flexibility, Postural dysfunction, Pain, Decreased scar mobility  Visit Diagnosis: Sacrococcygeal disorders, not elsewhere classified  Leg length difference, acquired  Other idiopathic scoliosis, thoracolumbar region  Chronic right shoulder pain     Problem List There are no active problems to display for this patient.   Jerl Mina ,PT, DPT, E-RYT  01/13/2018, 9:58 AM  [place] MAIN [facility] SERVICES [address] [place], [place], [address] Phone: [phone]   Fax:  [phone]  Name: Doris [name] MRN: [id number] Date of Birth: [birth year]

## 2018-01-20 ENCOUNTER — Ambulatory Visit: Payer: Medicare HMO | Attending: Obstetrics and Gynecology | Admitting: Physical Therapy

## 2018-01-20 DIAGNOSIS — M4125 Other idiopathic scoliosis, thoracolumbar region: Secondary | ICD-10-CM | POA: Diagnosis present

## 2018-01-20 DIAGNOSIS — M533 Sacrococcygeal disorders, not elsewhere classified: Secondary | ICD-10-CM

## 2018-01-20 DIAGNOSIS — M25511 Pain in right shoulder: Secondary | ICD-10-CM | POA: Diagnosis present

## 2018-01-20 DIAGNOSIS — M2170 Unequal limb length (acquired), unspecified site: Secondary | ICD-10-CM | POA: Diagnosis present

## 2018-01-20 DIAGNOSIS — G8929 Other chronic pain: Secondary | ICD-10-CM | POA: Diagnosis present

## 2018-01-20 NOTE — Therapy (Addendum)
[place] MAIN [facility] SERVICES [address] [place], [place], [address] Phone: [phone]   Fax:  [phone]  Physical Therapy Treatment  Patient Details  Name: Doris [name] MRN: [id number] Date of Birth: 07-05-1951 Referring Provider: Sharlett Iles    Encounter Date: 01/20/2018  PT End of Session - 01/20/18 1202    Visit Number  6    Number of Visits  12    PT Start Time  0805    PT Stop Time  0930    PT Time Calculation (min)  85 min    Activity Tolerance  Patient tolerated treatment well;No increased pain    Behavior During Therapy  WFL for tasks assessed/performed       Past Medical History:  Diagnosis Date  . Cancer (HCC)    Basal cell ( bridge of her nose)   . Headache    Migraines  . Hx of migraine headaches   . Medical history non-contributory    Mitral valve prolapse at 37 yrs of age    Past Surgical History:  Procedure Laterality Date  . CATARACT EXTRACTION  02/2017  . COLONOSCOPY WITH PROPOFOL N/A 03/03/2017   Procedure: COLONOSCOPY WITH PROPOFOL;  Surgeon: Garlan Fair, MD;  Location: [facility];  Service: Endoscopy;  Laterality: N/A;  . OOPHORECTOMY  2005    There were no vitals filed for this visit.  Subjective Assessment - 01/20/18 0815    Subjective  Pt reports it still pulls at the perineal scar  if she rakes the wrong way    Pertinent History  Denied blood in her stools, nausea, vomiting. This pain no longer wakes her up at night. Hobbie: gardening, walking 2 miles, swimming 1/2 mile     Patient Stated Goals  climbing a ladder. walking 2 miles          [facility] PT Assessment - 01/20/18 0817      Observation/Other Assessments   Observations  posterior tilt of pelvis, flat feet, hyperextended knees.  Raking posture:  excessive trunk movement without leg stability      Strength   Overall Strength Comments  hip ext ( pre Tx: 3+/5 R, L 4/5 R)  Post Tx B 4+/5 .   ankle eversion L 3-/5, R 4/5       Palpation   Palpation comment  coccyx flexed but not tender, L SIJ more hypomobile, lacking nutation                   [facility] Adult PT Treatment/Exercise - 01/20/18 0917      Neuro Re-ed    Neuro Re-ed Details   see pt instructions, reviewed floor to stand with proper alignment of of knee and heel to minimize genu valgus        Exercises   Exercises  -- see pt instructionsreviewed childs pose rocking      Manual Therapy   Manual therapy comments  fascial release at ischioanal fossa, medial aspect of ischial tuberosity L     Internal Pelvic Floor  scar release / perineal scar adhesion  scar restrictions 1nd layer 3 -6 clock             PT Education - 01/20/18 1202    Education provided  Yes    Education Details  HEP    Person(s) Educated  Patient    Methods  Explanation;Demonstration;Tactile cues;Verbal cues;Handout    Comprehension  Verbalized understanding;Returned demonstration;Verbal cues required;Tactile cues required  PT Long Term Goals - 12/23/17 2052      PT LONG TERM GOAL #1   Title  Pt will decrease her NIH-CPSI score from 67% to < 50% in order to demo improved pelvic floor function and fully complete urination with no difficulty    Time  12    Period  Weeks    Status  New      PT LONG TERM GOAL #2   Title  Pt will demo less flexed coccyx and no tenderness on B sacrococcygeal ligaments across 2 visits in order to climb a ladder and have less constipation     Time  4    Period  Weeks    Status  New      PT LONG TERM GOAL #3   Title  Pt will be IND with scoliosis specific exercises in order to minimize relapse of Sx    Time  8    Period  Weeks    Status  New      PT LONG TERM GOAL #4   Title  Pt will demo proper body mechanics with gardening, floor <> stand transfers, and alignment with deep core/ thoracolumbar/ lower kinetic chain strengthening with less cues in order to minimize risk for future injuries     Time  10    Period   Weeks    Status  New      PT LONG TERM GOAL #5   Title  Pt will demo levelled iliac crest in supine and stance across 2 visits in order to progress to deep core/ pelvic girdle strengthening     Time  2    Period  Weeks    Status  New      Additional Long Term Goals   Additional Long Term Goals  Yes      PT LONG TERM GOAL #6   Title  Pt will demo no referred pain with palpation along lateral areas of coccyx and less flexed coccyx across 2 visits in order to perform from  floor to stand  to garden     Time  6    Period  Weeks    Status  New    Target Date  02/03/18            Plan - 01/20/18 1202    Clinical Impression Statement  Pt is progressing well with less pelvic floor scar restrictions post manual Tx today. Provided neuromuscular reeducation on postural stability in exercises and functional tasks ( i.e. Raking)  to minimize overactivity of pelvic floor mm/ posterior tilt of pelvis. Plan to perform rectal assessment and Tx to continue addressing flexed coccyx. Performed manual Tx to increased L SIJ mobility and nutation of sacrum. Pt responded without complaints and showed increased mobility. Pt required biopsychosocial approaches with mindfulness/ relaxation training today as pt appeared tearful during the session. Pt stated she is feeling overwhelmed as she has been to her doctor about the swooshing sound she had been hearing in her ear. She has learned that she has a mitral valve prolapse and plans to f/u with her cardiologist. Pt was advised to limit heavy lifting/pushing/ pulling and to wait until she has consulted her cardiologist before she pushes her lawnmover.  Pt continues to benefit from skilled PT.    Rehab Potential  Good    Clinical Impairments Affecting Rehab Potential  flexed coccyx, pelvic obliquities, scoliosis, leg length difference, posterior tilt of pelvis,  Hx of 2 vaginal deliveries, misdiagnosed by  multiple providers and failed PT Tx     PT Frequency  1x /  week    PT Duration  12 weeks    PT Treatment/Interventions  Therapeutic activities;Neuromuscular re-education;Therapeutic exercise;Moist Heat;Gait training;Patient/family education;Manual techniques;Scar mobilization;Manual lymph drainage;Aquatic Therapy;Biofeedback;Traction;Electrical Stimulation;Energy conservation;Taping;Stair training;Balance training;Functional mobility training    Consulted and Agree with Plan of Care  Patient       Patient will benefit from skilled therapeutic intervention in order to improve the following deficits and impairments:  Abnormal gait, Decreased balance, Decreased endurance, Decreased mobility, Increased muscle spasms, Impaired sensation, Difficulty walking, Improper body mechanics, Decreased range of motion, Decreased activity tolerance, Decreased coordination, Decreased safety awareness, Decreased strength, Increased fascial restricitons, Impaired flexibility, Postural dysfunction, Pain, Decreased scar mobility  Visit Diagnosis: Sacrococcygeal disorders, not elsewhere classified  Leg length difference, acquired  Other idiopathic scoliosis, thoracolumbar region  Chronic right shoulder pain     Problem List There are no active problems to display for this patient.   Jerl Mina ,PT, DPT, E-RYT  01/20/2018, 12:12 PM  [place] MAIN [facility] SERVICES [address] [place], [place], [address] Phone: [phone]   Fax:  [phone]  Name: MIQUEL LAMSON MRN: [id number] Date of Birth: June 20, 1951

## 2018-01-20 NOTE — Patient Instructions (Addendum)
Standing posture:  Soft knees, more weight across ballmounds and heels , not just heels      ____________   Heel raises with rolled towel at heels  10 x 2 reps   Then calf stretch      __________  Ankle eversion with red band  10 reps x 2      __________ Doris [name]:  keep 50 % weight in the front leg, hip and center of mass rocking back without losing weight in front leg and foot     ________emailed body scan for relaxation

## 2018-01-26 ENCOUNTER — Encounter: Payer: Self-pay | Admitting: *Deleted

## 2018-01-26 ENCOUNTER — Emergency Department
Admission: EM | Admit: 2018-01-26 | Discharge: 2018-01-26 | Disposition: A | Discharge status: Home / Self Care | Payer: Medicare HMO | Attending: Emergency Medicine | Admitting: Emergency Medicine

## 2018-01-26 ENCOUNTER — Other Ambulatory Visit: Payer: Self-pay

## 2018-01-26 ENCOUNTER — Emergency Department: Payer: Medicare HMO

## 2018-01-26 DIAGNOSIS — R42 Dizziness and giddiness: Secondary | ICD-10-CM

## 2018-01-26 DIAGNOSIS — Z79899 Other long term (current) drug therapy: Secondary | ICD-10-CM | POA: Diagnosis not present

## 2018-01-26 DIAGNOSIS — Z85828 Personal history of other malignant neoplasm of skin: Secondary | ICD-10-CM | POA: Diagnosis not present

## 2018-01-26 DIAGNOSIS — R079 Chest pain, unspecified: Secondary | ICD-10-CM | POA: Diagnosis present

## 2018-01-26 DIAGNOSIS — R0789 Other chest pain: Secondary | ICD-10-CM | POA: Insufficient documentation

## 2018-01-26 LAB — CBC
HEMATOCRIT: 43.7 % (ref 35.0–47.0)
Hemoglobin: 14.7 g/dL (ref 12.0–16.0)
MCH: 30 pg (ref 26.0–34.0)
MCHC: 33.6 g/dL (ref 32.0–36.0)
MCV: 89.2 fL (ref 80.0–100.0)
Platelets: 301 10*3/uL (ref 150–440)
RBC: 4.9 MIL/uL (ref 3.80–5.20)
RDW: 14.4 % (ref 11.5–14.5)
WBC: 5.1 10*3/uL (ref 3.6–11.0)

## 2018-01-26 LAB — BASIC METABOLIC PANEL
Anion gap: 9 (ref 5–15)
BUN: 13 mg/dL (ref 6–20)
CALCIUM: 9.8 mg/dL (ref 8.9–10.3)
CO2: 26 mmol/L (ref 22–32)
Chloride: 100 mmol/L — ABNORMAL LOW (ref 101–111)
Creatinine, Ser: 0.62 mg/dL (ref 0.44–1.00)
GFR calc Af Amer: >60 mL/min (ref >60)
GLUCOSE: 109 mg/dL — AB (ref 65–99)
Potassium: 4.5 mmol/L (ref 3.5–5.1)
Sodium: 135 mmol/L (ref 135–145)

## 2018-01-26 LAB — TROPONIN I

## 2018-01-26 NOTE — ED Notes (Signed)
Pt states she needs to leave to make another appt she has and does not want to wait for results of 2nd troponin test. EDP McShane aware, education provided to pt by both RN and MD, pt states she would still like to leave and will sign AMA form.

## 2018-01-26 NOTE — ED Triage Notes (Signed)
Pt to ED reporting chest pain, lightheadedness x 1 hourand  a "sense of dome"  reported yesterday. Pt has mitral valve prolapse. Pain in left side of chest a radiating to the back. Nausea also reported but no vomiting. No cough or SOB reported.

## 2018-01-26 NOTE — ED Provider Notes (Signed)
[facility] Emergency Department Provider Note  ____________________________________________   I have reviewed the triage vital signs and the nursing notes. Where available I have reviewed prior notes and, if possible and indicated, outside hospital notes.    HISTORY  Chief Complaint Chest Pain and Dizziness    HPI Doris [name] is a 67 y.o. female who presents today complaining of "I feel okay and I have to get to an eye doctor" patient had a host of different events happened to her however over the last 24 hours.  She states she was listening to [place], she heard a story that was "eclectic" and it made her very emotional.  She states that she felt a sense of doom while listening to this and she felt very anxious.  She states that that lasted for a brief period yesterday but that made her very cognizant of everything happening in her body.  She did not have any breakfast this morning and she had what she describes as her classic reflux disease with a burning sensation in her stomach which was brief and transitory, to me she denied any radiation, she denies any chest pain to me, no exertional symptoms no shortness of breath no cough, she states that she stood up in a hurry when she had the reflux and she felt a brief period of lightheadedness.  She did not pass out.  She has no complaints or symptoms at this time.  She states "my nerves have been getting to me".  She has no SI or HI.  Patient is adamant that she has to leave and go to her eye doctor exam.  She states she has no symptoms at this time.  She states she believes this to have been her GERD.  Patient was seen and evaluated in the triage booth, EKG and blood work were obtained which are reassuring.    Past Medical History:  Diagnosis Date  . Cancer (HCC)    Basal cell ( bridge of her nose)   . Headache    Migraines  . Hx of migraine headaches   . Medical history non-contributory    Mitral valve prolapse  at 19 yrs of age    There are no active problems to display for this patient.   Past Surgical History:  Procedure Laterality Date  . CATARACT EXTRACTION  02/2017  . COLONOSCOPY WITH PROPOFOL N/A 03/03/2017   Procedure: COLONOSCOPY WITH PROPOFOL;  Surgeon: Garlan Fair, MD;  Location: [facility];  Service: Endoscopy;  Laterality: N/A;  . OOPHORECTOMY  2005    Prior to Admission medications   Medication Sig Start Date End Date Taking? Authorizing Provider  b complex vitamins tablet Take 0.5 tablets by mouth daily.    [provider]  Bromfenac Sodium 0.075 % SOLN Place 1 drop into the left eye 2 (two) times daily.    [provider]  Cholecalciferol (VITAMIN D3) LIQD Take 1 drop by mouth daily.     [provider]  Javier Docker Oil 350 MG CAPS Take 350 mg by mouth.    [provider]  Multiple Vitamins-Minerals (ICAPS AREDS 2 PO) Take 1 capsule by mouth 2 (two) times daily.    [provider]  ofloxacin (OCUFLOX) 0.3 % ophthalmic solution Place 1 drop into the left eye 4 (four) times daily.    [provider]  Polyethyl Glycol-Propyl Glycol (SYSTANE ULTRA OP) Place 1 drop into the right eye every 2 (two) hours as needed (dry eyes).  [provider]  prednisoLONE acetate (PRED FORTE) 1 % ophthalmic suspension Place 1 drop into the left eye 4 (four) times daily.    [provider]  vitamin B-12 (CYANOCOBALAMIN) 1000 MCG tablet Take 1,000 mcg by mouth daily.    [provider]    Allergies Benzocaine; Clarithromycin; Doxycycline; Lidocaine; Metronidazole; and Penicillins  Family History  Problem Relation Age of Onset  . Diabetes Mother   . Cancer Mother   . Cancer Father     Social History Social History   Tobacco Use  . Smoking status: Former Smoker    Types: Cigarettes  . Smokeless tobacco: Never Used  Substance Use Topics  . Alcohol use: Yes    Comment: Beer a month  . Drug use: No     Review of Systems Constitutional: No fever/chills Eyes: No visual changes. ENT: No sore throat. No stiff neck no neck pain Cardiovascular: See HPI Respiratory: Denies shortness of breath. Gastrointestinal:   no vomiting.  No diarrhea.  No constipation. Genitourinary: Negative for dysuria. Musculoskeletal: Negative lower extremity swelling Skin: Negative for rash. Neurological: Negative for severe headaches, focal weakness or numbness.   ____________________________________________   PHYSICAL EXAM:  VITAL SIGNS: ED Triage Vitals  Enc Vitals Group     BP 01/26/18 1119 125/83     Pulse Rate 01/26/18 1119 80     Resp 01/26/18 1119 16     Temp 01/26/18 1119 98.2 F (36.8 C)     Temp Source 01/26/18 1119 Oral     SpO2 01/26/18 1119 100 %     Weight 01/26/18 1106 135 lb (61.2 kg)     Height 01/26/18 1106 5' 3.5" (1.613 m)     Head Circumference --      Peak Flow --      Pain Score 01/26/18 1106 1     Pain Loc --      Pain Edu? --      Excl. in [place]? --     Constitutional: Alert and oriented. Well appearing and in no acute distress.  Somewhat anxious but in no acute medical distress Eyes: Conjunctivae are normal Head: Atraumatic HEENT: No congestion/rhinnorhea. Mucous membranes are moist.  Oropharynx non-erythematous Neck:   Nontender with no meningismus, no masses, no stridor Cardiovascular: Normal rate, regular rhythm. Grossly normal heart sounds.  Good peripheral circulation. Respiratory: Normal respiratory effort.  No retractions. Lungs CTAB. Abdominal: Soft and nontender. No distention. No guarding no rebound Back:  There is no focal tenderness or step off.  there is no midline tenderness there are no lesions noted. there is no CVA tenderness Musculoskeletal: No lower extremity tenderness, no upper extremity tenderness. No joint effusions, no DVT signs strong distal pulses no edema Neurologic:  Normal speech and language. No gross focal neurologic deficits are  appreciated.  Skin:  Skin is warm, dry and intact. No rash noted. Psychiatric: Mood and affect are quite anxious. Speech and behavior are normal.  ____________________________________________   LABS (all labs ordered are listed, but only abnormal results are displayed)  Labs Reviewed  BASIC METABOLIC PANEL - Abnormal; Notable for the following components:      Result Value   Chloride 100 (*)    Glucose, Bld 109 (*)    All other components within normal limits  CBC  TROPONIN I  TROPONIN I    Pertinent labs  results that were available during my care of the patient were reviewed by me and considered in my medical decision making (see  chart for details). ____________________________________________  EKG  I personally interpreted any EKGs ordered by me or triage Normal sinus rhythm rate 77 bpm no acute ST elevation no acute ST depression normal axis unremarkable EKG ____________________________________________  RADIOLOGY  Pertinent labs & imaging results that were available during my care of the patient were reviewed by me and considered in my medical decision making (see chart for details). If possible, patient and/or family made aware of any abnormal findings.  Dg Chest 2 View  Result Date: 01/26/2018 CLINICAL DATA:  Patient reports left-sided chest pain radiating to the back associated with nausea, lightheadedness, and sense of impending doom for an hour yesterday. History of mitral valve prolapse. No vomiting or shortness of breath. EXAM: CHEST - 2 VIEW COMPARISON:  Portable chest x-ray of October 05, 2011 FINDINGS: The lungs are adequately inflated. There is linear increased density lateral to the cardiac apex which is new since the previous study. There is no alveolar infiltrate or pleural effusion. There is no pneumothorax, pneumomediastinum, or pleural effusion. The heart and pulmonary vascularity are normal. The mediastinum is normal in width. The bony thorax exhibits no acute  abnormality. IMPRESSION: There is no pneumonia nor other acute cardiopulmonary abnormality. Electronically Signed   By: David  Martinique M.D.   On: 01/26/2018 11:55   ____________________________________________    PROCEDURES  Procedure(s) performed: None  Procedures  Critical Care performed: None  ____________________________________________   INITIAL IMPRESSION / ASSESSMENT AND PLAN / ED COURSE  Pertinent labs & imaging results that were available during my care of the patient were reviewed by me and considered in my medical decision making (see chart for details).  Patient here with a emotional reaction to a radio program yesterday, she states which made her more emotional and also she states increased her sensitivity to everything happening in her body.  She did not have much to eat or drink today, she stood up suddenly became little lightheaded and noticed she was having some reflux disease before that.  She states this is her classic burning reflux in her epigastric region.  She wanted to make sure that everything was "okay" however she also has an eye doctor appointment and she is adamant that she does not want to stay for serial cardiac enzymes she does not wish any further assessment she feels fine she thinks she was just overreacting and she wants to go home.  Chest x-ray blood work vital signs EKG all are reassuring and patient's story is certainly more consistent with an anxiety event then an acute coronary syndrome.  She has been having symptoms, such as they are variously described, since last night but she has a negative work-up here.  I have however explained to the patient that there is no way without serial cardiac enzymes and further assessment that I can tell her it is safe to go home.  She understands this.  She states she will follow-up on my chart for repeat troponin, after some coaxing she does allow me to draw the troponin but she states she would not wait for the results.   She wants her discharge instructions right now.  She understands the limitations this places upon me in terms of ruling out cardiac or other pathologies that, and she is very comfortable with it.  She states that she will follow-up as an outpatient and she has no acute issues at this time.  Given the patient refuses to stay understands the risks of going home has a very atypical story  and reassuring cardiac work-up thus far we will of course let her go I feel she has the capacity to understand the risk benefits and alternatives to discharge.  We will therefore discharge her essentially [place] at her own insistence, and we will have her closely follow-up with PCP.    ____________________________________________   FINAL CLINICAL IMPRESSION(S) / ED DIAGNOSES  Final diagnoses:  None      This chart was dictated using voice recognition software.  Despite best efforts to proofread,  errors can occur which can change meaning.      Schuyler Amor, MD 01/26/18 [phone]

## 2018-01-26 NOTE — Discharge Instructions (Addendum)
Without staying in the hospital for further evaluation there is no IV continue definitively there is nothing wrong with your heart or lungs, please follow closely with your primary care doctor and your heart doctor as soon as possible for further evaluation.  Return to the emergency room for any new or worrisome symptoms.  If you change your mind about going home, please come back.  If you have chest pain shortness of breath nausea vomiting lightheadedness or you feel worse in any way return to the emergency department we will be happy to see you

## 2018-01-27 ENCOUNTER — Ambulatory Visit: Payer: Medicare HMO | Admitting: Physical Therapy

## 2018-01-27 DIAGNOSIS — M4125 Other idiopathic scoliosis, thoracolumbar region: Secondary | ICD-10-CM

## 2018-01-27 DIAGNOSIS — M2170 Unequal limb length (acquired), unspecified site: Secondary | ICD-10-CM

## 2018-01-27 DIAGNOSIS — M533 Sacrococcygeal disorders, not elsewhere classified: Secondary | ICD-10-CM | POA: Diagnosis not present

## 2018-01-27 DIAGNOSIS — G8929 Other chronic pain: Secondary | ICD-10-CM

## 2018-01-27 DIAGNOSIS — M25511 Pain in right shoulder: Secondary | ICD-10-CM

## 2018-01-27 NOTE — Patient Instructions (Addendum)
(  handout on R upper trap stretch)       Thread the needle with hands at counter  Knees unlocked, hips dont move,  R hand on L thigh  3 breaths,  Look under armpit      Thumbs down, wrist cross over Shoulder blades spread Bring hands in as shoulders glide down and back, chin tuck

## 2018-01-27 NOTE — Therapy (Signed)
[place] MAIN [facility] SERVICES [address] [place], [place], [address] Phone: [phone]   Fax:  [phone]  Physical Therapy Treatment  Patient Details  Name: Doris [name] MRN: [id number] Date of Birth: 02/10/1951 Referring Provider: Sharlett Iles    Encounter Date: 01/27/2018  PT End of Session - 01/27/18 0811    Visit Number  7    Number of Visits  12    PT Start Time  0813    PT Stop Time  0912    PT Time Calculation (min)  59 min    Activity Tolerance  Patient tolerated treatment well;No increased pain    Behavior During Therapy  WFL for tasks assessed/performed       Past Medical History:  Diagnosis Date  . Cancer (HCC)    Basal cell ( bridge of her nose)   . Headache    Migraines  . Hx of migraine headaches   . Medical history non-contributory    Mitral valve prolapse at 73 yrs of age    Past Surgical History:  Procedure Laterality Date  . CATARACT EXTRACTION  02/2017  . COLONOSCOPY WITH PROPOFOL N/A 03/03/2017   Procedure: COLONOSCOPY WITH PROPOFOL;  Surgeon: Garlan Fair, MD;  Location: [facility];  Service: Endoscopy;  Laterality: N/A;  . OOPHORECTOMY  2005    There were no vitals filed for this visit.  Subjective Assessment - 01/27/18 0814    Subjective  Pt went to the ER yesterday because she woke up feeling chest pain and lightheadedness. Pt was sent home w/ no significant findings. Pt contacted her PCP about physical restrictions due to her regurgitations from mitral valve prolapse and she was told she has no restrictions with physical exertion. Pt has more tired lately and she will be meeting with her cardiologist in 2 weeks. Pt will be asking about a sleep study to screen for OSA as suggested by PT given her risk factors for cardiac issues, R nasal being stopped up, and increased fatigue.  She is pacing her activities to 30 min at a time as PT recommended.  Pt also went to see her doctor about an eye  infection which has improved since using compresses.  Pt has using her leg techniques with pulling her hose and doing garden work.            [facility] PT Assessment - 01/27/18 0859      Palpation   Spinal mobility  increased mm tensions at upper trap/ medial mm to scapula on R     SI assessment   minor hypomobility at L SIJ, slightly flexed coccyx, levelled iliac crest                      [facility] Adult PT Treatment/Exercise - 01/27/18 0857      Neuro Re-ed    Neuro Re-ed Details   see pt instructions      Manual Therapy   Manual therapy comments  PA mob on L SIJ , inferior glide along lateral borders of coccyx     Scapular Mobilization  STM , posterior T10-12 intercostals mm, medial scap on R               PT Education - 01/27/18 0911    Education provided  Yes    Education Details  HEP    Person(s) Educated  Patient    Methods  Explanation    Comprehension  Verbalized understanding;Returned demonstration;Verbal cues required  PT Long Term Goals - 01/27/18 1456      PT LONG TERM GOAL #1   Title  Pt will decrease her NIH-CPSI score from 67% to < 50% in order to demo improved pelvic floor function and fully complete urination with no difficulty    Time  12    Period  Weeks    Status  On-going      PT LONG TERM GOAL #2   Title  Pt will demo less flexed coccyx and no tenderness on B sacrococcygeal ligaments across 2 visits in order to climb a ladder and have less constipation     Time  4    Period  Weeks    Status  Partially Met      PT LONG TERM GOAL #3   Title  Pt will be IND with scoliosis specific exercises in order to minimize relapse of Sx    Time  8    Period  Weeks    Status  On-going      PT LONG TERM GOAL #4   Title  Pt will demo proper body mechanics with gardening, floor <> stand transfers, and alignment with deep core/ thoracolumbar/ lower kinetic chain strengthening with less cues in order to minimize risk for future injuries      Time  10    Period  Weeks    Status  On-going      PT LONG TERM GOAL #5   Title  Pt will demo levelled iliac crest in supine and stance across 2 visits in order to progress to deep core/ pelvic girdle strengthening     Time  2    Period  Weeks    Status  Achieved      Additional Long Term Goals   Additional Long Term Goals  Yes      PT LONG TERM GOAL #6   Title  Pt will demo no referred pain with palpation along lateral areas of coccyx and less flexed coccyx across 2 visits in order to perform from  floor to stand  to garden     Time  6    Period  Weeks    Status  On-going      PT LONG TERM GOAL #7   Title  Pt will demo decreased tightenss in UE shoulder/ neck / medial scapular region in order to minimize injuries/ relapse of Sx and maintain equal alignment of SIJ .     Time  4    Period  Weeks    Status  New    Target Date  02/24/18            Plan - 01/27/18 5053    Clinical Impression Statement Pt continues to show improved posture with significantly less posterior tilt of pelvis and less thoracic kyphosis. Pt showed minor hypomobility on L SIJ which demonstrates good carry over from last Tx. Pt showed increased tightness at R medial scapular region and neck. Suspect pt's overuse of R neck / shoulder mm from swimming with R head turns for breathing in addition to R thoracolumbar curve 2/2 scoliosis are both contributing factors to her R sided posterior UE mm imbalance. Treated this area with manual Tx which pt tolerated without complaints. Involvement of thoracolumbar to opposite glut fascial connection will be considered to help pt yield longer lasting benefits for optimal pelvic alignment and SIJ mobility. Pt continues to benefit from skilled PT.    Other medical issues update:  Pt went to  the ER yesterday because she woke up feeling chest pain and lightheadedness. Pt was sent home w/ no significant findings. Pt contacted her PCP about physical restrictions due to her  regurgitations from mitral valve prolapse and she was told she has no restrictions with physical exertion. Pt has more tired lately and she will be meeting with her cardiologist in 2 weeks. Pt will be asking about a sleep study to screen for OSA as suggested by PT given her risk factors for cardiac issues, R nasal being stopped up, and increased fatigue.  She is pacing her activities to 30 min at a time as PT recommended.  Pt also went to see her doctor about an eye infection which has improved since using compresses.     Rehab Potential  Good    Clinical Impairments Affecting Rehab Potential  flexed coccyx, pelvic obliquities, scoliosis, leg length difference, posterior tilt of pelvis,  Hx of 2 vaginal deliveries, misdiagnosed by multiple providers and failed PT Tx     PT Frequency  1x / week    PT Duration  12 weeks    PT Treatment/Interventions  Therapeutic activities;Neuromuscular re-education;Therapeutic exercise;Moist Heat;Gait training;Patient/family education;Manual techniques;Scar mobilization;Manual lymph drainage;Aquatic Therapy;Biofeedback;Traction;Electrical Stimulation;Energy conservation;Taping;Stair training;Balance training;Functional mobility training    Consulted and Agree with Plan of Care  Patient       Patient will benefit from skilled therapeutic intervention in order to improve the following deficits and impairments:  Abnormal gait, Decreased balance, Decreased endurance, Decreased mobility, Increased muscle spasms, Impaired sensation, Difficulty walking, Improper body mechanics, Decreased range of motion, Decreased activity tolerance, Decreased coordination, Decreased safety awareness, Decreased strength, Increased fascial restricitons, Impaired flexibility, Postural dysfunction, Pain, Decreased scar mobility  Visit Diagnosis: Sacrococcygeal disorders, not elsewhere classified  Leg length difference, acquired  Other idiopathic scoliosis, thoracolumbar region  Chronic right  shoulder pain     Problem List There are no active problems to display for this patient.   Jerl Mina ,PT, DPT, E-RYT  01/27/2018, 3:09 PM  [place] MAIN [facility] SERVICES [address] [place], [place], [address] Phone: [phone]   Fax:  [phone]  Name: TERSA FOTOPOULOS MRN: [id number] Date of Birth: 1951/07/31

## 2018-02-03 ENCOUNTER — Encounter: Payer: Medicare HMO | Admitting: Physical Therapy

## 2018-02-10 ENCOUNTER — Ambulatory Visit: Payer: Medicare HMO | Admitting: Physical Therapy

## 2018-02-10 DIAGNOSIS — M25511 Pain in right shoulder: Secondary | ICD-10-CM

## 2018-02-10 DIAGNOSIS — M4125 Other idiopathic scoliosis, thoracolumbar region: Secondary | ICD-10-CM

## 2018-02-10 DIAGNOSIS — M533 Sacrococcygeal disorders, not elsewhere classified: Secondary | ICD-10-CM | POA: Diagnosis not present

## 2018-02-10 DIAGNOSIS — M2170 Unequal limb length (acquired), unspecified site: Secondary | ICD-10-CM

## 2018-02-10 DIAGNOSIS — G8929 Other chronic pain: Secondary | ICD-10-CM

## 2018-02-10 NOTE — Therapy (Signed)
[place] MAIN [facility] SERVICES [address] [place], [place], [address] Phone: [phone]   Fax:  [phone]  Physical Therapy Treatment  Patient Details  Name: Doris [name] MRN: [id number] Date of Birth: 09-05-1951 Referring Provider: Sharlett Iles    Encounter Date: 02/10/2018  PT End of Session - 02/10/18 0908    Visit Number  8    Number of Visits  12    PT Start Time  0815    PT Stop Time  0908    PT Time Calculation (min)  53 min    Activity Tolerance  Patient tolerated treatment well;No increased pain    Behavior During Therapy  WFL for tasks assessed/performed       Past Medical History:  Diagnosis Date  . Cancer (HCC)    Basal cell ( bridge of her nose)   . Headache    Migraines  . Hx of migraine headaches   . Medical history non-contributory    Mitral valve prolapse at 4 yrs of age    Past Surgical History:  Procedure Laterality Date  . CATARACT EXTRACTION  02/2017  . COLONOSCOPY WITH PROPOFOL N/A 03/03/2017   Procedure: COLONOSCOPY WITH PROPOFOL;  Surgeon: Garlan Fair, MD;  Location: [facility];  Service: Endoscopy;  Laterality: N/A;  . OOPHORECTOMY  2005    There were no vitals filed for this visit.  Subjective Assessment - 02/10/18 0817    Subjective  Pt reports she still use her finger to press sometimes to get the last bit of stool out with bowel movements. Pt performs her exercises once a day. Pt has been feeling less dizzy in the morning since she has been sleeping with her head and trunk elevated on a wedge. Pt has seen her PCP and she will be referred to a cardiovalve  specialist re: her aortic/ tricuspid/mitral valves regurgitation          [facility] PT Assessment - 02/10/18 0001      Palpation   Spinal mobility  increased posterior rotation of L thorax with increased paraspinal mm tensions     SI assessment   iliac crest levelled          Cued for more supination of feet 2/2 pronation/ flat  arches in HEP          [facility] Adult PT Treatment/Exercise - 02/10/18 0908      Neuro Re-ed    Neuro Re-ed Details   see pt instructions      Manual Therapy   Internal Pelvic Floor  scar release / perineal scar adhesion  scar restrictions 2-3nd layer 3-9 oclock             PT Education - 02/10/18 0909    Education provided  Yes    Education Details  HEP    Person(s) Educated  Patient    Methods  Explanation;Tactile cues;Verbal cues;Demonstration;Handout    Comprehension  Returned demonstration;Verbalized understanding          PT Long Term Goals - 01/27/18 1456      PT LONG TERM GOAL #1   Title  Pt will decrease her NIH-CPSI score from 67% to < 50% in order to demo improved pelvic floor function and fully complete urination with no difficulty    Time  12    Period  Weeks    Status  On-going      PT LONG TERM GOAL #2   Title  Pt will demo less flexed  coccyx and no tenderness on B sacrococcygeal ligaments across 2 visits in order to climb a ladder and have less constipation     Time  4    Period  Weeks    Status  Partially Met      PT LONG TERM GOAL #3   Title  Pt will be IND with scoliosis specific exercises in order to minimize relapse of Sx    Time  8    Period  Weeks    Status  On-going      PT LONG TERM GOAL #4   Title  Pt will demo proper body mechanics with gardening, floor <> stand transfers, and alignment with deep core/ thoracolumbar/ lower kinetic chain strengthening with less cues in order to minimize risk for future injuries     Time  10    Period  Weeks    Status  On-going      PT LONG TERM GOAL #5   Title  Pt will demo levelled iliac crest in supine and stance across 2 visits in order to progress to deep core/ pelvic girdle strengthening     Time  2    Period  Weeks    Status  Achieved      Additional Long Term Goals   Additional Long Term Goals  Yes      PT LONG TERM GOAL #6   Title  Pt will demo no referred pain with palpation  along lateral areas of coccyx and less flexed coccyx across 2 visits in order to perform from  floor to stand  to garden     Time  6    Period  Weeks    Status  On-going      PT LONG TERM GOAL #7   Title  Pt will demo decreased tightenss in UE shoulder/ neck / medial scapular region in order to minimize injuries/ relapse of Sx and maintain equal alignment of SIJ .     Time  4    Period  Weeks    Status  New    Target Date  02/24/18            Plan - 02/10/18 0909    Clinical Impression Statement  Pt is demonstrating decreased perineal scar restrictions following Tx. Anticipate  more intravaginal Tx will help pt regain full pelvic ROM to help pt not have to splint with bowel elimination. Added HEP to address rotational component to scoliosis which pt performed correctly after training. Pt continues to benefit from skilled PT.     Rehab Potential  Good    Clinical Impairments Affecting Rehab Potential  flexed coccyx, pelvic obliquities, scoliosis, leg length difference, posterior tilt of pelvis,  Hx of 2 vaginal deliveries, misdiagnosed by multiple providers and failed PT Tx     PT Frequency  1x / week    PT Duration  12 weeks    PT Treatment/Interventions  Therapeutic activities;Neuromuscular re-education;Therapeutic exercise;Moist Heat;Gait training;Patient/family education;Manual techniques;Scar mobilization;Manual lymph drainage;Aquatic Therapy;Biofeedback;Traction;Electrical Stimulation;Energy conservation;Taping;Stair training;Balance training;Functional mobility training    Consulted and Agree with Plan of Care  Patient       Patient will benefit from skilled therapeutic intervention in order to improve the following deficits and impairments:  Abnormal gait, Decreased balance, Decreased endurance, Decreased mobility, Increased muscle spasms, Impaired sensation, Difficulty walking, Improper body mechanics, Decreased range of motion, Decreased activity tolerance, Decreased  coordination, Decreased safety awareness, Decreased strength, Increased fascial restricitons, Impaired flexibility, Postural dysfunction, Pain, Decreased scar mobility    Visit Diagnosis: Sacrococcygeal disorders, not elsewhere classified  Leg length difference, acquired  Other idiopathic scoliosis, thoracolumbar region  Chronic right shoulder pain     Problem List There are no active problems to display for this patient.   Yeung,Shin Yiing ,PT, DPT, E-RYT  02/10/2018, 9:11 AM  [facility] MA[facility] REH[facility] SERVICES [address] [place], [place], [address] Phone: [phone]   Fax:  [phone]  Name: Doris [name] MRN: [id number] Date of Birth: 10/08/1950   

## 2018-02-10 NOTE — Patient Instructions (Addendum)
STRETCHES FOR L LOW BACK   REVERSE    :   Feet are hip width apart, L foot one behind like you are on ski tracks,  R knee bent over ankle but not more forward then the ankle.  Make sure 50% weight is in the front foot/leg , 50% weight is the back foot/ leg    Rest R forearm lightly on top of thigh,  L hand on L hip.  Inhale lengthen spine,   Exhale turn navel to the L then the ribcage turns, look at the other wall.  Keep maintaining  50% weight is in the front foot/leg , 50% weight is the back foot/ leg  And make sure the front knee is still pointed in the toe line of the 2nd toe.   3 breaths here.    DOWNWARD FACING DOG ON CHAIR with yoga strap at waist  3 direction of stretch, middle, L/R   ________  TO CORRECT TRUNK ROTATION    Standing PNF band exercises    "Drawing a sword" " Pulling a lawnmover" Band is under fee, hold band in L hand, across body by R pocket  Press downward into the ballmounds and heels of the feet, thigh muscle active, Keep knees and hips squared the front and do not move them throughout the activity, Exhale to pull band from R pocket,  across the face to the R pocket, rotating only your ribcage to L as if "drawing a sword"    10 x 2 reps   _______  Happy baby pose wit strap for pelvic releases

## 2018-02-11 ENCOUNTER — Encounter: Payer: Self-pay | Admitting: Physical Therapy

## 2018-02-18 ENCOUNTER — Ambulatory Visit: Payer: Medicare HMO | Admitting: Physical Therapy

## 2018-02-18 DIAGNOSIS — G8929 Other chronic pain: Secondary | ICD-10-CM

## 2018-02-18 DIAGNOSIS — M533 Sacrococcygeal disorders, not elsewhere classified: Secondary | ICD-10-CM

## 2018-02-18 DIAGNOSIS — M25511 Pain in right shoulder: Secondary | ICD-10-CM

## 2018-02-18 DIAGNOSIS — M2170 Unequal limb length (acquired), unspecified site: Secondary | ICD-10-CM

## 2018-02-18 DIAGNOSIS — M4125 Other idiopathic scoliosis, thoracolumbar region: Secondary | ICD-10-CM

## 2018-02-18 NOTE — Therapy (Signed)
[place] MAIN [facility] SERVICES [address] [place], [place], [address] Phone: [phone]   Fax:  [phone]  Physical Therapy Treatment / Progress Note  Patient Details  Name: Doris [name] MRN: [id number] Date of Birth: 29-Aug-1951 Referring Provider: Sharlett Iles    Encounter Date: 02/18/2018  PT End of Session - 02/18/18 0834    Visit Number  9    Number of Visits     Date for PT Re-Evaluation  05/13/18    PT Start Time  0812    PT Stop Time  0910    PT Time Calculation (min)  58 min    Activity Tolerance  Patient tolerated treatment well;No increased pain    Behavior During Therapy  WFL for tasks assessed/performed       Past Medical History:  Diagnosis Date  . Cancer (HCC)    Basal cell ( bridge of her nose)   . Headache    Migraines  . Hx of migraine headaches   . Medical history non-contributory    Mitral valve prolapse at 9 yrs of age    Past Surgical History:  Procedure Laterality Date  . CATARACT EXTRACTION  02/2017  . COLONOSCOPY WITH PROPOFOL N/A 03/03/2017   Procedure: COLONOSCOPY WITH PROPOFOL;  Surgeon: Garlan Fair, MD;  Location: [facility];  Service: Endoscopy;  Laterality: N/A;  . OOPHORECTOMY  2005    There were no vitals filed for this visit.  Subjective Assessment - 02/18/18 0828    Subjective  Pt feels her R buttock mm is sore from working in her yard for 4 hours.  Prior to working in the yard that day, pt had hardly felt the pain for 1 week          [facility] PT Assessment - 02/18/18 0859      Strength   Overall Strength Comments  hip ext 4-/5 R, L 5/5        Palpation   Palpation comment  minor tightness at R ischial tuberosity / tubercle                    [facility] Adult PT Treatment/Exercise - 02/18/18 1030      Therapeutic Activites    Therapeutic Activities  -- see pt instructions       Neuro Re-ed    Neuro Re-ed Details   see pt instructions      Manual Therapy   Manual therapy comments  sustained pressure at R ischial              PT Education - 02/18/18 0831    Education provided  Yes    Education Details  HEP    Person(s) Educated  Patient    Methods  Explanation;Demonstration;Tactile cues;Handout;Verbal cues    Comprehension  Verbalized understanding;Returned demonstration          PT Long Term Goals - 02/18/18 0829      PT LONG TERM GOAL #1   Title  Pt will decrease her NIH-CPSI score from 67% to < 50% in order to demo improved pelvic floor function and fully complete urination with no difficulty (5/30: 41%)     Time  12    Period  Weeks    Status  Achieved      PT LONG TERM GOAL #2   Title  Pt will demo less flexed coccyx and no tenderness on B sacrococcygeal ligaments across 2 visits in order to climb a ladder and have  less constipation     Time  4    Period  Weeks    Status  Achieved      PT LONG TERM GOAL #3   Title  Pt will be IND with scoliosis specific exercises in order to minimize relapse of Sx    Time  8    Period  Weeks    Status  Achieved      PT LONG TERM GOAL #4   Title  Pt will demo proper body mechanics with gardening, floor <> stand transfers, and alignment with deep core/ thoracolumbar/ lower kinetic chain strengthening with less cues in order to minimize risk for future injuries     Time  10    Period  Weeks    Status  Achieved      PT LONG TERM GOAL #5   Title  Pt will demo levelled iliac crest in supine and stance across 2 visits in order to progress to deep core/ pelvic girdle strengthening     Time  2    Period  Weeks    Status  Achieved      Additional Long Term Goals   Additional Long Term Goals  Yes      PT LONG TERM GOAL #6   Title  Pt will demo no referred pain with palpation along lateral areas of coccyx and less flexed coccyx across 2 visits in order to perform from  floor to stand  to garden     Time  6    Period  Weeks    Status  Achieved      PT LONG TERM GOAL #7   Title  Pt  will demo decreased tightness in UE shoulder/ neck / medial scapular region in order to minimize injuries/ relapse of Sx and maintain equal alignment of SIJ .     Time  4    Period  Weeks    Status  Achieved      PT LONG TERM GOAL #8   Title  Pt will increase hip ext strength on R from 3+/5 to >4/5 in order to progress to glut strengthening.     Time  4    Period  Weeks    Status  New    Target Date  03/18/18      PT LONG TERM GOAL  #9   TITLE  Pt will report tenderness at her perineal scar upon intravaginal assessment across 2 visits in order to minimize relapse of pain and tightness at R glut/ coccygeus     Time  6    Period  Weeks    Status  New    Target Date  04/01/18            Plan - 02/18/18 1252    Clinical Impression Statement  Pt has acheived 7/9 goals. Pt's NIH-CPSI score decreased signficantly from 67% to 41%. Her coccyx is normal alignment and less flexed position and her constipation has resolved. Pt's spinal deviations 2/2 scoliosis have improved with less mm tensions while she demonstrates a more upright posture with less posterior tilt of her pelvis. Pt's pelvic floor tensions have decreased but pt still has tenderness upon palpation at perineal scars and demonstrates glut weakness. Pt remains compliant with scoliosis specific HEP and continues to understand how to use better body mechanics with gardening, bending, and lifting. Pt continues to benefit from skilled PT.      Rehab Potential  Good    Clinical Impairments Affecting  Rehab Potential  flexed coccyx, pelvic obliquities, scoliosis, leg length difference, posterior tilt of pelvis,  Hx of 2 vaginal deliveries, misdiagnosed by multiple providers and failed PT Tx     PT Frequency  1x / week    PT Duration  12 weeks    PT Treatment/Interventions  Therapeutic activities;Neuromuscular re-education;Therapeutic exercise;Moist Heat;Gait training;Patient/family education;Manual techniques;Scar mobilization;Manual lymph  drainage;Aquatic Therapy;Biofeedback;Traction;Electrical Stimulation;Energy conservation;Taping;Stair training;Balance training;Functional mobility training    Consulted and Agree with Plan of Care  Patient       Patient will benefit from skilled therapeutic intervention in order to improve the following deficits and impairments:  Abnormal gait, Decreased balance, Decreased endurance, Decreased mobility, Increased muscle spasms, Impaired sensation, Difficulty walking, Improper body mechanics, Decreased range of motion, Decreased activity tolerance, Decreased coordination, Decreased safety awareness, Decreased strength, Increased fascial restricitons, Impaired flexibility, Postural dysfunction, Pain, Decreased scar mobility  Visit Diagnosis: Sacrococcygeal disorders, not elsewhere classified  Leg length difference, acquired  Other idiopathic scoliosis, thoracolumbar region  Chronic right shoulder pain     Problem List There are no active problems to display for this patient.   Jerl Mina ,PT, DPT, E-RYT   02/18/2018, 12:58 PM  [place] MAIN [facility] SERVICES [address] [place], [place], [address] Phone: [phone]   Fax:  [phone]  Name: Doris [name] MRN: [id number] Date of Birth: 13-Jan-1951

## 2018-02-18 NOTE — Patient Instructions (Addendum)
To pick up sticks:  variate your position between minisquat, lunges  When lunging, stand beside the sticks to pick stick sup by your side not in front     When squatting, point butt up to sky , knees behind toes  __  Post yard stretches to stretch the buttock region -- SEE VIDEOS   Twist Foot propped on tree root or bench  "hip flexor stretch on the side of the hip with foot on ground, and glut/ coccygeus stretch on foot on bench"  Then add twist but lengthening spine on inhale and then gentle twist, place hand on thigh     Extended side angle :  Feet are hip width apart, L foot one behind like you are on ski tracks,  R knee bent over ankle but not more forward then the ankle.  Make sure 50% weight is in the front foot/leg , 50% weight is the back foot/ leg    Rest R forearm lightly on top of thigh,  L hand on L hip.  Inhale lengthen spine,   Exhale turn navel to the L then the ribcage turns, look at the other wall.  Keep maintaining  50% weight is in the front foot/leg , 50% weight is the back foot/ leg  And make sure the front knee is still pointed in the toe line of the 2nd toe.   3 breaths here.   Tree pose  30 sec on R leg only    Single leg bridge x 5

## 2018-02-24 ENCOUNTER — Encounter: Payer: Medicare HMO | Admitting: Physical Therapy

## 2018-03-04 ENCOUNTER — Ambulatory Visit: Payer: Medicare HMO | Admitting: Physical Therapy

## 2018-03-15 ENCOUNTER — Emergency Department: Payer: Medicare HMO

## 2018-03-15 ENCOUNTER — Emergency Department
Admission: EM | Admit: 2018-03-15 | Discharge: 2018-03-15 | Disposition: A | Discharge status: Home / Self Care | Payer: Medicare HMO | Attending: Emergency Medicine | Admitting: Emergency Medicine

## 2018-03-15 ENCOUNTER — Other Ambulatory Visit: Payer: Self-pay

## 2018-03-15 DIAGNOSIS — G8918 Other acute postprocedural pain: Secondary | ICD-10-CM | POA: Insufficient documentation

## 2018-03-15 DIAGNOSIS — Z87891 Personal history of nicotine dependence: Secondary | ICD-10-CM | POA: Diagnosis not present

## 2018-03-15 DIAGNOSIS — R1031 Right lower quadrant pain: Secondary | ICD-10-CM | POA: Diagnosis present

## 2018-03-15 LAB — COMPREHENSIVE METABOLIC PANEL
ALT: 33 U/L (ref 14–54)
ANION GAP: 8 (ref 5–15)
AST: 34 U/L (ref 15–41)
Albumin: 4.3 g/dL (ref 3.5–5.0)
Alkaline Phosphatase: 81 U/L (ref 38–126)
BUN: 10 mg/dL (ref 6–20)
CO2: 23 mmol/L (ref 22–32)
CREATININE: 0.59 mg/dL (ref 0.44–1.00)
Calcium: 9.4 mg/dL (ref 8.9–10.3)
Chloride: 103 mmol/L (ref 101–111)
GLUCOSE: 118 mg/dL — AB (ref 65–99)
Potassium: 3.9 mmol/L (ref 3.5–5.1)
Sodium: 134 mmol/L — ABNORMAL LOW (ref 135–145)
Total Bilirubin: 0.6 mg/dL (ref 0.3–1.2)
Total Protein: 6.9 g/dL (ref 6.5–8.1)

## 2018-03-15 LAB — CBC WITH DIFFERENTIAL/PLATELET
Basophils Absolute: 0.1 10*3/uL (ref 0–0.1)
Basophils Relative: 2 %
EOS PCT: 7 %
Eosinophils Absolute: 0.3 10*3/uL (ref 0–0.7)
HCT: 42.2 % (ref 35.0–47.0)
Hemoglobin: 14.4 g/dL (ref 12.0–16.0)
LYMPHS PCT: 25 %
Lymphs Abs: 1.3 10*3/uL (ref 1.0–3.6)
MCH: 30.2 pg (ref 26.0–34.0)
MCHC: 34.1 g/dL (ref 32.0–36.0)
MCV: 88.7 fL (ref 80.0–100.0)
Monocytes Absolute: 0.5 10*3/uL (ref 0.2–0.9)
Monocytes Relative: 10 %
Neutro Abs: 2.9 10*3/uL (ref 1.4–6.5)
Neutrophils Relative %: 56 %
PLATELETS: 272 10*3/uL (ref 150–440)
RBC: 4.76 MIL/uL (ref 3.80–5.20)
RDW: 13.9 % (ref 11.5–14.5)
WBC: 5.2 10*3/uL (ref 3.6–11.0)

## 2018-03-15 LAB — LIPASE, BLOOD: Lipase: 37 U/L (ref 11–51)

## 2018-03-15 MED ORDER — IOPAMIDOL (ISOVUE-300) INJECTION 61%
100.0000 mL | Freq: Once | INTRAVENOUS | Status: AC | PRN
  Administered 2018-03-15: 100 mL via INTRAVENOUS

## 2018-03-15 NOTE — ED Notes (Signed)
CT notified patient has placed IV for scan

## 2018-03-15 NOTE — Discharge Instructions (Signed)
It was a pleasure to take care of you today, and thank you for coming to our emergency department.  If you have any questions or concerns before leaving please ask the nurse to grab me and I'm more than happy to go through your aftercare instructions again.  If you were prescribed any opioid pain medication today such as Norco, Vicodin, Percocet, morphine, hydrocodone, or oxycodone please make sure you do not drive when you are taking this medication as it can alter your ability to drive safely.  If you have any concerns once you are home that you are not improving or are in fact getting worse before you can make it to your follow-up appointment, please do not hesitate to call [phone] and come back for further evaluation.  Darel Hong, MD  Results for orders placed or performed during the hospital encounter of 03/15/18  Comprehensive metabolic panel  Result Value Ref Range   Sodium 134 (L) 135 - 145 mmol/L   Potassium 3.9 3.5 - 5.1 mmol/L   Chloride 103 101 - 111 mmol/L   CO2 23 22 - 32 mmol/L   Glucose, Bld 118 (H) 65 - 99 mg/dL   BUN 10 6 - 20 mg/dL   Creatinine, Ser 0.59 0.44 - 1.00 mg/dL   Calcium 9.4 8.9 - 10.3 mg/dL   Total Protein 6.9 6.5 - 8.1 g/dL   Albumin 4.3 3.5 - 5.0 g/dL   AST 34 15 - 41 U/L   ALT 33 14 - 54 U/L   Alkaline Phosphatase 81 38 - 126 U/L   Total Bilirubin 0.6 0.3 - 1.2 mg/dL   GFR calc non Af Amer >60 >60 mL/min   GFR calc Af Amer >60 >60 mL/min   Anion gap 8 5 - 15  Lipase, blood  Result Value Ref Range   Lipase 37 11 - 51 U/L  CBC with Differential  Result Value Ref Range   WBC 5.2 3.6 - 11.0 K/uL   RBC 4.76 3.80 - 5.20 MIL/uL   Hemoglobin 14.4 12.0 - 16.0 g/dL   HCT 42.2 35.0 - 47.0 %   MCV 88.7 80.0 - 100.0 fL   MCH 30.2 26.0 - 34.0 pg   MCHC 34.1 32.0 - 36.0 g/dL   RDW 13.9 11.5 - 14.5 %   Platelets 272 150 - 440 K/uL   Neutrophils Relative % 56 %   Neutro Abs 2.9 1.4 - 6.5 K/uL   Lymphocytes Relative 25 %   Lymphs Abs 1.3 1.0 - 3.6 K/uL   Monocytes Relative 10 %   Monocytes Absolute 0.5 0.2 - 0.9 K/uL   Eosinophils Relative 7 %   Eosinophils Absolute 0.3 0 - 0.7 K/uL   Basophils Relative 2 %   Basophils Absolute 0.1 0 - 0.1 K/uL   Ct Abdomen Pelvis W Contrast  Result Date: 03/15/2018 CLINICAL DATA:  Acute abdominal pain EXAM: CT ABDOMEN AND PELVIS WITH CONTRAST TECHNIQUE: Multidetector CT imaging of the abdomen and pelvis was performed using the standard protocol following bolus administration of intravenous contrast. CONTRAST:  154mL ISOVUE-300 IOPAMIDOL (ISOVUE-300) INJECTION 61% COMPARISON:  None. FINDINGS: Lower chest: Lung bases clear Hepatobiliary: Numerous small and medium hepatic cysts. Liver size and contour normal. Normal gallbladder and bile ducts Pancreas: Negative Spleen: Negative Adrenals/Urinary Tract: Normal kidneys ureter and bladder. No renal mass obstruction or stone. Stomach/Bowel: Negative for bowel obstruction. No bowel mass or edema. Moderate stool throughout the colon. Appendix not visualize Vascular/Lymphatic: Negative Reproductive: Hysterectomy and oophorectomy.  No pelvic  mass. Other: No free fluid Musculoskeletal: Mild scoliosis. Mild anterolisthesis L2-3. Mild facet degeneration throughout the lumbar spine. Negative for fracture. 15 mm sclerotic lesion in the anterior femoral head on the left without aggressive features. Possible chondroid lesion at the growth plate. IMPRESSION: No acute abnormality in the abdomen Moderate stool in colon Appendix not visualized. Electronically Signed   By: Franchot Gallo M.D.   On: 03/15/2018 07:04

## 2018-03-15 NOTE — ED Triage Notes (Signed)
Patient with complaints of post op pain following an angiocath about a week ago. States today she bent over to pick up sticks and she started to have a great deal of pain into her back. She presents to ER with concerns of "how many things it could be." The pain actually woke her up tonight.

## 2018-03-15 NOTE — ED Provider Notes (Signed)
[facility] Emergency Department Provider Note  ____________________________________________   First MD Initiated Contact with Patient 03/15/18 [phone]     (approximate)  I have reviewed the triage vital signs and the nursing notes.   HISTORY  Chief Complaint Post-op Problem   HPI Doris [name] is a 67 y.o. female who self presents to the emergency department with right lower quadrant abdominal pain and right groin pain for the past several days.  About 10 days ago the patient had an angiogram performed to evaluate for coronary artery disease and for her mitral valve.  According to the patient her coronary arteries were normal however she has developed significant mitral prolapse.  She initially did well after the angiogram however for the past several days is progressively developed increasing discomfort in her right groin and she is concerned that she may have a "retroperitoneal bleed".  Her pain became acutely worse yesterday when leaning over to garden and she felt a "pop".  The pain is subsequently been constant.  Some nausea no vomiting.  The pain is nonradiating.  It seems to be somewhat worse with movement somewhat improved with rest.  It is sharp and aching.    Past Medical History:  Diagnosis Date  . Cancer (HCC)    Basal cell ( bridge of her nose)   . Headache    Migraines  . Hx of migraine headaches   . Medical history non-contributory    Mitral valve prolapse at 19 yrs of age    There are no active problems to display for this patient.   Past Surgical History:  Procedure Laterality Date  . CARDIAC CATHETERIZATION    . CATARACT EXTRACTION  02/2017  . COLONOSCOPY WITH PROPOFOL N/A 03/03/2017   Procedure: COLONOSCOPY WITH PROPOFOL;  Surgeon: Garlan Fair, MD;  Location: [facility];  Service: Endoscopy;  Laterality: N/A;  . OOPHORECTOMY  2005    Prior to Admission medications   Medication Sig Start Date End Date Taking?  Authorizing Provider  b complex vitamins tablet Take 0.5 tablets by mouth daily.    [provider]  Bromfenac Sodium 0.075 % SOLN Place 1 drop into the left eye 2 (two) times daily.    [provider]  Cholecalciferol (VITAMIN D3) LIQD Take 1 drop by mouth daily.     [provider]  Javier Docker Oil 350 MG CAPS Take 350 mg by mouth.    [provider]  Multiple Vitamins-Minerals (ICAPS AREDS 2 PO) Take 1 capsule by mouth 2 (two) times daily.    [provider]  ofloxacin (OCUFLOX) 0.3 % ophthalmic solution Place 1 drop into the left eye 4 (four) times daily.    [provider]  Polyethyl Glycol-Propyl Glycol (SYSTANE ULTRA OP) Place 1 drop into the right eye every 2 (two) hours as needed (dry eyes).    [provider]  prednisoLONE acetate (PRED FORTE) 1 % ophthalmic suspension Place 1 drop into the left eye 4 (four) times daily.    [provider]  vitamin B-12 (CYANOCOBALAMIN) 1000 MCG tablet Take 1,000 mcg by mouth daily.    [provider]    Allergies Benzocaine; Clarithromycin; Doxycycline; Lidocaine; Metronidazole; and Penicillins  Family History  Problem Relation Age of Onset  . Diabetes Mother   . Cancer Mother   . Cancer Father     Social History Social History   Tobacco Use  . Smoking status: Former Smoker    Types: Cigarettes  . Smokeless  tobacco: Never Used  Substance Use Topics  . Alcohol use: Yes    Comment: Beer a month  . Drug use: No    Review of Systems Constitutional: No fever/chills Eyes: No visual changes. ENT: No sore throat. Cardiovascular: Denies chest pain. Respiratory: Denies shortness of breath. Gastrointestinal: positive for abdominal pain.  No nausea, no vomiting.  No diarrhea.  No constipation. Genitourinary: Negative for dysuria. Musculoskeletal: Negative for back pain. Skin: Positive for rash. Neurological: Negative for headaches, focal weakness or  numbness.   ____________________________________________   PHYSICAL EXAM:  VITAL SIGNS: ED Triage Vitals  Enc Vitals Group     BP 03/15/18 0350 135/80     Pulse Rate 03/15/18 0350 93     Resp 03/15/18 0350 18     Temp 03/15/18 0350 97.7 F (36.5 C)     Temp Source 03/15/18 0350 Oral     SpO2 03/15/18 0350 97 %     Weight 03/15/18 0338 132 lb (59.9 kg)     Height 03/15/18 0338 5\' 3"  (1.6 m)     Head Circumference --      Peak Flow --      Pain Score 03/15/18 0337 5     Pain Loc --      Pain Edu? --      Excl. in [place]? --     Constitutional: Alert and oriented x4 pleasant cooperative speaks full clear sentences no diaphoresis Eyes: PERRL EOMI. Head: Atraumatic. Nose: No congestion/rhinnorhea. Mouth/Throat: No trismus Neck: No stridor.   Cardiovascular: Normal rate, regular rhythm. Grossly normal heart sounds.  Good peripheral circulation. Respiratory: Normal respiratory effort.  No retractions. Lungs CTAB and moving good air Gastrointestinal: Soft abdomen somewhat tender in right lower quadrant but no peritonitis no rebound no guarding no McBurney's tenderness Musculoskeletal: No lower extremity edema   2+ femoral pulse in the right Neurologic:  Normal speech and language. No gross focal neurologic deficits are appreciated. Skin: Ecchymosis in the right lower quadrant and ecchymosis in the right upper thigh. Psychiatric: Mood and affect are normal. Speech and behavior are normal.    ____________________________________________   DIFFERENTIAL includes but not limited to  Femoral artery dissection, femoral artery pseudoaneurysm, retroperitoneal hematoma ____________________________________________   LABS (all labs ordered are listed, but only abnormal results are displayed)  Labs Reviewed  COMPREHENSIVE METABOLIC PANEL - Abnormal; Notable for the following components:      Result Value   Sodium 134 (*)    Glucose, Bld 118 (*)    All other components within normal  limits  LIPASE, BLOOD  CBC WITH DIFFERENTIAL/PLATELET    Lab work reviewed by me with no acute disease __________________________________________  EKG   ____________________________________________  RADIOLOGY  CT abdomen pelvis reviewed by me with no acute disease noted ____________________________________________   PROCEDURES  Procedure(s) performed: no  Procedures  Critical Care performed: no  ____________________________________________   INITIAL IMPRESSION / ASSESSMENT AND PLAN / ED COURSE  Pertinent labs & imaging results that were available during my care of the patient were reviewed by me and considered in my medical decision making (see chart for details).   Patient has abdominal pain and right groin pain about 10 days after a cardiac catheterization.  I do have some concern for possible arterial injury versus aneurysm following the catheterization since CT scan with IV contrast is pending.  She declines pain medication at this time.     ----------------------------------------- 7:15 AM on 03/15/2018 ----------------------------------------- CT scan is fortunately reassuring.  The  patient is in no pain.  She is medically stable for outpatient management verbalizes understanding and agreement with the plan.  ____________________________________________   FINAL CLINICAL IMPRESSION(S) / ED DIAGNOSES  Final diagnoses:  Post-operative pain      NEW MEDICATIONS STARTED DURING THIS VISIT:  New Prescriptions   No medications on file     Note:  This document was prepared using Dragon voice recognition software and may include unintentional dictation errors.     Darel Hong, MD 03/15/18 [id number]

## 2018-03-18 ENCOUNTER — Ambulatory Visit: Payer: Medicare HMO | Admitting: Physical Therapy

## 2018-03-18 ENCOUNTER — Ambulatory Visit: Payer: Medicare HMO | Attending: Obstetrics and Gynecology | Admitting: Physical Therapy

## 2018-03-18 DIAGNOSIS — G8929 Other chronic pain: Secondary | ICD-10-CM | POA: Diagnosis present

## 2018-03-18 DIAGNOSIS — M533 Sacrococcygeal disorders, not elsewhere classified: Secondary | ICD-10-CM | POA: Insufficient documentation

## 2018-03-18 DIAGNOSIS — M4125 Other idiopathic scoliosis, thoracolumbar region: Secondary | ICD-10-CM | POA: Diagnosis present

## 2018-03-18 DIAGNOSIS — M2170 Unequal limb length (acquired), unspecified site: Secondary | ICD-10-CM | POA: Insufficient documentation

## 2018-03-18 DIAGNOSIS — M25511 Pain in right shoulder: Secondary | ICD-10-CM | POA: Diagnosis present

## 2018-03-19 NOTE — Therapy (Addendum)
[place] MAIN [facility] SERVICES [address] [place], [place], [address] Phone: [phone]   Fax:  [phone]  Physical Therapy Progress Note  Patient Details  Name: Doris [name] MRN: [id number] Date of Birth: 07-29-1951 Referring Provider: Sharlett Iles    Encounter Date: 03/18/2018   Physical Therapy Progress Note   Dates of reporting period  12/17/17   to   03/18/18   Past Medical History:  Diagnosis Date  . Cancer (HCC)    Basal cell ( bridge of her nose)   . Headache    Migraines  . Hx of migraine headaches   . Medical history non-contributory    Mitral valve prolapse at 60 yrs of age    Past Surgical History:  Procedure Laterality Date  . CARDIAC CATHETERIZATION    . CATARACT EXTRACTION  02/2017  . COLONOSCOPY WITH PROPOFOL N/A 03/03/2017   Procedure: COLONOSCOPY WITH PROPOFOL;  Surgeon: Garlan Fair, MD;  Location: [facility];  Service: Endoscopy;  Laterality: N/A;  . OOPHORECTOMY  2005    There were no vitals filed for this visit.  Subjective Assessment - 03/19/18 1808    Subjective  Pt had a catherization procedure 11 days ago which showed she will need a mitral valve repair and possibly aortic valve replacement.  Her R groin area hurt alot after the procedure. Pt had to strain with bowel movements and was gardening too much. Pt went to the ER and later saw her cardiologist. CTscan showed her R groin area was normal. Pt states that her R groin area is better now.            [facility] PT Assessment - 03/19/18 1758      Strength   Overall Strength Comments  hip ext 4-/5 R, L 4-/5        Palpation   SI assessment   R ASIS more anterior                 Pelvic Floor Special Questions - 03/19/18 1759    Pelvic Floor Internal Exam  Pt consented verbally without contraindications     Exam Type  Vaginal    Palpation  no scar restrictions         [facility] Adult PT Treatment/Exercise - 03/19/18 0001       Manual Therapy   Manual Therapy  -- rotational mob of R pelvis             PT Education - 03/19/18 1806    Education provided  Yes    Education Details  HEP    Person(s) Educated  Patient    Methods  Explanation;Demonstration;Tactile cues;Verbal cues;Handout    Comprehension  Returned demonstration;Verbalized understanding          PT Long Term Goals - 03/18/18 0819      PT LONG TERM GOAL #1   Title  Pt will decrease her NIH-CPSI score from 67% to < 50% in order to demo improved pelvic floor function and fully complete urination with no difficulty (5/30: 41%)     Time  12    Period  Weeks    Status  Achieved      PT LONG TERM GOAL #2   Title  Pt will demo less flexed coccyx and no tenderness on B sacrococcygeal ligaments across 2 visits in order to climb a ladder and have less constipation     Time  4    Period  Weeks  Status  Achieved      PT LONG TERM GOAL #3   Title  Pt will be IND with scoliosis specific exercises in order to minimize relapse of Sx    Time  8    Period  Weeks    Status  Achieved      PT LONG TERM GOAL #4   Title  Pt will demo proper body mechanics with gardening, floor <> stand transfers, and alignment with deep core/ thoracolumbar/ lower kinetic chain strengthening with less cues in order to minimize risk for future injuries     Time  10    Period  Weeks    Status  Achieved      PT LONG TERM GOAL #5   Title  Pt will demo levelled iliac crest in supine and stance across 2 visits in order to progress to deep core/ pelvic girdle strengthening     Time  2    Period  Weeks    Status  Achieved      PT LONG TERM GOAL #6   Title  Pt will demo no referred pain with palpation along lateral areas of coccyx and less flexed coccyx across 2 visits in order to perform from  floor to stand  to garden     Time  6    Period  Weeks    Status  Achieved      PT LONG TERM GOAL #7   Title  Pt will demo decreased tightness in UE shoulder/ neck / medial  scapular region in order to minimize injuries/ relapse of Sx and maintain equal alignment of SIJ .     Time  4    Period  Weeks    Status  Achieved      PT LONG TERM GOAL #8   Title  Pt will increase hip ext strength on R from 3+/5 to >4/5 in order to progress to glut strengthening.     Time  4    Period  Weeks    Status  Partially Met      PT LONG TERM GOAL  #9   TITLE  Pt will report tenderness at her perineal scar upon intravaginal assessment across 2 visits in order to minimize relapse of pain and tightness at R glut/ coccygeus     Time  6    Period  Weeks    Status On going           Plan - 03/19/18 1809    Clinical Impression Statement Pt has achieved 8/9 goals since [facility]. Pt's coccyx pain has resolved and pt has returned to walking, sitting, standing, gardening, and having regular bowel movements. Pt's perineal scar restrictions and pelvic floor mm have decreased signficantly.  Pt 's posture has improved with more upright posture and less posterior tilt of pelvis. Pt's scoliosis deficits have also been addressed with specific HEP. Pt's remaining deficits include hip extension strength and perineal scar restrictions. Pt did not receive pelvic floor strengthening today due to pt's previous overactivity presentation but will be ready when she presents with less perineal scar restrictions and proper lengthening of pelvic floor. Pt will also need more hip extension strengthening. Pt continues to benefit from skilled PT.     Pt currently has to tend to cardiac issues and has pending surgery for defective heart valves.     Rehab Potential  Good    Clinical Impairments Affecting Rehab Potential  flexed coccyx, pelvic obliquities, scoliosis, leg length difference, posterior  tilt of pelvis,  Hx of 2 vaginal deliveries, misdiagnosed by multiple providers and failed PT Tx     PT Frequency  1x / week    PT Duration  12 weeks    PT Treatment/Interventions  Therapeutic  activities;Neuromuscular re-education;Therapeutic exercise;Moist Heat;Gait training;Patient/family education;Manual techniques;Scar mobilization;Manual lymph drainage;Aquatic Therapy;Biofeedback;Traction;Electrical Stimulation;Energy conservation;Taping;Stair training;Balance training;Functional mobility training    Consulted and Agree with Plan of Care  Patient       Patient will benefit from skilled therapeutic intervention in order to improve the following deficits and impairments:  Abnormal gait, Decreased balance, Decreased endurance, Decreased mobility, Increased muscle spasms, Impaired sensation, Difficulty walking, Improper body mechanics, Decreased range of motion, Decreased activity tolerance, Decreased coordination, Decreased safety awareness, Decreased strength, Increased fascial restricitons, Impaired flexibility, Postural dysfunction, Pain, Decreased scar mobility  Visit Diagnosis: Sacrococcygeal disorders, not elsewhere classified  Leg length difference, acquired  Other idiopathic scoliosis, thoracolumbar region  Chronic right shoulder pain     Problem List There are no active problems to display for this patient.   Jerl Mina ,PT, DPT, E-RYT  03/19/2018, 6:17 PM  [place] MAIN [facility] SERVICES [address] [place], [place], [address] Phone: [phone]   Fax:  [phone]  Name: Doris [name] MRN: [id number] Date of Birth: March 28, 1951

## 2018-03-19 NOTE — Patient Instructions (Signed)
Marked which exercises were for scoliosis and which ones to keep doing

## 2018-03-29 NOTE — Addendum Note (Signed)
Addended by: Jerl Mina on: 03/29/2018 10:00 AM   Modules accepted: Orders

## 2018-04-01 ENCOUNTER — Ambulatory Visit: Payer: Medicare HMO | Admitting: Physical Therapy

## 2018-04-08 ENCOUNTER — Ambulatory Visit: Payer: Medicare HMO | Admitting: Physical Therapy

## 2018-04-15 ENCOUNTER — Ambulatory Visit: Payer: Medicare HMO | Attending: Obstetrics and Gynecology | Admitting: Physical Therapy

## 2018-04-15 DIAGNOSIS — M533 Sacrococcygeal disorders, not elsewhere classified: Secondary | ICD-10-CM | POA: Diagnosis not present

## 2018-04-15 DIAGNOSIS — M25511 Pain in right shoulder: Secondary | ICD-10-CM | POA: Insufficient documentation

## 2018-04-15 DIAGNOSIS — M4125 Other idiopathic scoliosis, thoracolumbar region: Secondary | ICD-10-CM

## 2018-04-15 DIAGNOSIS — G8929 Other chronic pain: Secondary | ICD-10-CM | POA: Diagnosis present

## 2018-04-15 DIAGNOSIS — M2170 Unequal limb length (acquired), unspecified site: Secondary | ICD-10-CM | POA: Insufficient documentation

## 2018-04-15 NOTE — Patient Instructions (Signed)
Table top position  --> bird dog  ( opp arm and leg, toe touching floor)  20 reps  ( lengthening spine and core)    Table top position ---> green band under R toes, exhale pull the band into a half V . Then return to table top and relax shoulders down  20reps ( for L low back scoliosis)    PELVIC FLOOR / KEGEL EXERCISES   Pelvic floor/ Kegel exercises are used to strengthen the muscles in the base of your pelvis that are responsible for supporting your pelvic organs and preventing urine/feces leakage. Based on your therapist's recommendations, they can be performed while standing, sitting, or lying down. Imagine pelvic floor area as a diamond with pelvic landmarks: top =pubic bone, bottom tip=tailbone, sides=sitting bones (ischial tuberosities).    Make yourself aware of this muscle group by using these cues while coordinating your breath:  Inhale, feel pelvic floor diamond area lower like hammock towards your feet and ribcage/belly expanding. Pause. Let the exhale naturally and feel your belly sink, abdominal muscles hugging in around you and you may notice the pelvic diamond draws upward towards your head forming a umbrella shape. Give a squeeze during the exhalation like you are stopping the flow of urine. If you are squeezing the buttock muscles, try to give 50% less effort.   Common Errors:  Breath holding: If you are holding your breath, you may be bearing down against your bladder instead of pulling it up. If you belly bulges up while you are squeezing, you are holding your breath. Be sure to breathe gently in and out while exercising. Counting out loud may help you avoid holding your breath.  Accessory muscle use: You should not see or feel other muscle movement when performing pelvic floor exercises. When done properly, no one can tell that you are performing the exercises. Keep the buttocks, belly and inner thighs relaxed.  Overdoing it: Your muscles can fatigue and stop working for  you if you over-exercise. You may actually leak more or feel soreness at the lower abdomen or rectum.  YOUR HOME EXERCISE PROGRAM    SHORT HOLDS: Position: on sitting or on back   Inhale and then exhale. Then squeeze the muscle.  (Be sure to let belly sink in with exhales and not push outward)  Perform 5 repetitions, 5  Times/day Breathing first for optimal relaxation of pelvic floor  Do not over do kegels                       DECREASE DOWNWARD PRESSURE ON  YOUR PELVIC FLOOR, ABDOMINAL, LOW BACK MUSCLES       PRESERVE YOUR PELVIC HEALTH LONG-TERM   ** SQUEEZE pelvic floor BEFORE YOUR SNEEZE, COUGH, LAUGH   ** EXHALE BEFORE YOU RISE AGAINST GRAVITY (lifting, sit to stand, from squat to stand)   ** LOG ROLL OUT OF BED INSTEAD OF CRUNCH/SIT-UP

## 2018-04-16 NOTE — Therapy (Signed)
[place] MAIN [facility] SERVICES [address] [place], [place], [address] Phone: [phone]   Fax:  [phone]  Physical Therapy Treatment / Discharge Note   Patient Details  Name: Doris [name] MRN: [id number] Date of Birth: 07-13-1951 Referring Provider: Sharlett Iles    Encounter Date: 04/15/2018  PT End of Session - 04/15/18 0823    Visit Number  11    Date for PT Re-Evaluation  05/13/18    PT Start Time  0811    PT Stop Time  0905    PT Time Calculation (min)  54 min    Activity Tolerance  Patient tolerated treatment well;No increased pain    Behavior During Therapy  WFL for tasks assessed/performed       Past Medical History:  Diagnosis Date  . Cancer (HCC)    Basal cell ( bridge of her nose)   . Headache    Migraines  . Hx of migraine headaches   . Medical history non-contributory    Mitral valve prolapse at 84 yrs of age    Past Surgical History:  Procedure Laterality Date  . CARDIAC CATHETERIZATION    . CATARACT EXTRACTION  02/2017  . COLONOSCOPY WITH PROPOFOL N/A 03/03/2017   Procedure: COLONOSCOPY WITH PROPOFOL;  Surgeon: Garlan Fair, MD;  Location: [facility];  Service: Endoscopy;  Laterality: N/A;  . OOPHORECTOMY  2005    There were no vitals filed for this visit.  Subjective Assessment - 04/15/18 0823    Subjective  Pt was doing great and was walking 2-3 miles a day. Pt had minor tendonitis and bone spur and has been resting.          [facility] PT Assessment - 04/16/18 1231      Observation/Other Assessments   Observations  less thoracic kyphosis , posterior tilt of pelvis , hyperextension of knees'      Posture/Postural Control   Posture Comments  lumbopelvic pertubation with bnirddog R LE hip ext ( same side as concave curve) Modified ex with CKC of foot to improve stability      Strength   Overall Strength Comments  hip ext B 5/5, hip abd 4-/5 B      Palpation   SI assessment   iliac crest  levelled                    [facility] Adult PT Treatment/Exercise - 04/16/18 1231      Neuro Re-ed    Neuro Re-ed Details   see pt instructions                  PT Long Term Goals - 04/15/18 0825      PT LONG TERM GOAL #1   Title  Pt will decrease her NIH-CPSI score from 67% to < 50% in order to demo improved pelvic floor function and fully complete urination with no difficulty (5/30: 41%)     Time  12    Period  Weeks    Status  Achieved      PT LONG TERM GOAL #2   Title  Pt will demo less flexed coccyx and no tenderness on B sacrococcygeal ligaments across 2 visits in order to climb a ladder and have less constipation     Time  4    Period  Weeks    Status  Achieved      PT LONG TERM GOAL #3   Title  Pt will be IND with  scoliosis specific exercises in order to minimize relapse of Sx    Time  8    Period  Weeks    Status  Achieved      PT LONG TERM GOAL #4   Title  Pt will demo proper body mechanics with gardening, floor <> stand transfers, and alignment with deep core/ thoracolumbar/ lower kinetic chain strengthening with less cues in order to minimize risk for future injuries     Time  10    Period  Weeks    Status  Achieved      PT LONG TERM GOAL #5   Title  Pt will demo levelled iliac crest in supine and stance across 2 visits in order to progress to deep core/ pelvic girdle strengthening     Time  2    Period  Weeks    Status  Achieved      PT LONG TERM GOAL #6   Title  Pt will demo no referred pain with palpation along lateral areas of coccyx and less flexed coccyx across 2 visits in order to perform from  floor to stand  to garden     Time  6    Period  Weeks    Status  Achieved      PT LONG TERM GOAL #7   Title  Pt will demo decreased tightness in UE shoulder/ neck / medial scapular region in order to minimize injuries/ relapse of Sx and maintain equal alignment of SIJ .     Time  4    Period  Weeks    Status  Achieved      PT LONG  TERM GOAL #8   Title  Pt will increase hip ext strength on R from 3+/5 to >4/5 in order to progress to glut strengthening.  ( 7/25: 4/5     Time  4    Period  Weeks    Status  Achieved      PT LONG TERM GOAL  #9   TITLE  Pt will report tenderness at her perineal scar upon intravaginal assessment across 2 visits in order to minimize relapse of pain and tightness at R glut/ coccygeus     Time  6    Period  Weeks    Status  Partially Met            Plan - 04/15/18 0913    Clinical Impression Statement Across 11 visits,  pt has achieved 8/9 goals and partially met her remaining goal. Pt has returned to sitting ,  walking , and gardening without tailbone pain.  Pt has demonstrated and maintained:  _ equal alignment of her coccyx and pelvic girdle, _increased postural stability with deep core coordination and strengthening exercises _increased strength in hip and thoracolumbar mm _ less mm imbalance 2/2 scoliosis  _improved up posture with significantly less thoracic kyphosis and posterior pelvic tilt, hyperextension oof knees, increased co-activation of feet  _IND with HEP and proper body mechanics with gardening to minimize strain to back nor pelvis  Pt reported her coccyx pain has improved "Quite A Bit Better" based on the GROC. Today, advanced pt with scoliosis specific strengthening ex. Pt demo'd   lumbopelvic pertubation with birddog R LE hip ext ( same side as concave curve).  Modified ex with CKC of foot to improve stability. Added pelvic floor strengthening program today as pt no longer demonstrates pelvic floor mm tensions. Pt made progress with decreased perineal scar restrictions which enabled her to  improve pelvic floor mobility and lengthening. Pt is ready for d/c at this time.      Rehab Potential  Good    Clinical Impairments Affecting Rehab Potential  flexed coccyx, pelvic obliquities, scoliosis, leg length difference, posterior tilt of pelvis,  Hx of 2 vaginal deliveries,  misdiagnosed by multiple providers and failed PT Tx     PT Frequency  1x / week    PT Duration  12 weeks    PT Treatment/Interventions  Therapeutic activities;Neuromuscular re-education;Therapeutic exercise;Moist Heat;Gait training;Patient/family education;Manual techniques;Scar mobilization;Manual lymph drainage;Aquatic Therapy;Biofeedback;Traction;Electrical Stimulation;Energy conservation;Taping;Stair training;Balance training;Functional mobility training    Consulted and Agree with Plan of Care  Patient       Patient will benefit from skilled therapeutic intervention in order to improve the following deficits and impairments:  Abnormal gait, Decreased balance, Decreased endurance, Decreased mobility, Increased muscle spasms, Impaired sensation, Difficulty walking, Improper body mechanics, Decreased range of motion, Decreased activity tolerance, Decreased coordination, Decreased safety awareness, Decreased strength, Increased fascial restricitons, Impaired flexibility, Postural dysfunction, Pain, Decreased scar mobility  Visit Diagnosis: Sacrococcygeal disorders, not elsewhere classified  Leg length difference, acquired  Other idiopathic scoliosis, thoracolumbar region  Chronic right shoulder pain     Problem List There are no active problems to display for this patient.   Jerl Mina ,PT, DPT, E-RYT  04/16/2018, 12:35 PM  [place] [facility]IN [facility] SERVICES [address] [place], [place], [address] Phone: [phone]   Fax:  [phone]  Name: Doris [name] MRN: [id number] Date of Birth: April 13, 1951

## 2018-04-20 DIAGNOSIS — I5022 Chronic systolic (congestive) heart failure: Secondary | ICD-10-CM | POA: Insufficient documentation

## 2018-04-22 DIAGNOSIS — Z9889 Other specified postprocedural states: Secondary | ICD-10-CM | POA: Insufficient documentation

## 2018-04-22 DIAGNOSIS — I442 Atrioventricular block, complete: Secondary | ICD-10-CM | POA: Insufficient documentation

## 2018-05-22 ENCOUNTER — Other Ambulatory Visit
Admission: RE | Admit: 2018-05-22 | Discharge: 2018-05-22 | Disposition: A | Discharge status: Home / Self Care | Payer: Medicare HMO | Source: Ambulatory Visit | Attending: Internal Medicine | Admitting: Internal Medicine

## 2018-05-22 DIAGNOSIS — M79601 Pain in right arm: Secondary | ICD-10-CM | POA: Insufficient documentation

## 2018-05-22 LAB — FIBRIN DERIVATIVES D-DIMER (ARMC ONLY): Fibrin derivatives D-dimer (ARMC): 958.27 ng/mL (FEU) — ABNORMAL HIGH (ref 0.00–499.00)

## 2018-06-03 ENCOUNTER — Encounter: Payer: Medicare HMO | Attending: Internal Medicine | Admitting: *Deleted

## 2018-06-03 DIAGNOSIS — Z9889 Other specified postprocedural states: Secondary | ICD-10-CM

## 2018-06-03 NOTE — Progress Notes (Signed)
Incomplete Session Note  Patient Details  Name: Doris [name] MRN: [id number] Date of Birth: [birth year] Referring Provider:    Sylvester Harder did not complete her Med Review today. She was not able to do her 6 minute walk due to insurance coverage issues. She hopes to have more clarity as to what they will cover once she calls them soon. She will contact staff once she knows more.

## 2018-06-07 ENCOUNTER — Encounter: Payer: Self-pay | Admitting: *Deleted

## 2018-06-07 DIAGNOSIS — I38 Endocarditis, valve unspecified: Secondary | ICD-10-CM | POA: Insufficient documentation

## 2018-06-07 NOTE — Progress Notes (Signed)
Doris [name] called to inform Cardiac Rehab staff that after her conversation with her physician, she does not feel like she needs Cardiac Rehab. Staff informed her that if she changes her mind, she can call back to reschedule.

## 2018-06-15 ENCOUNTER — Ambulatory Visit
Admission: RE | Admit: 2018-06-15 | Discharge: 2018-06-15 | Disposition: A | Discharge status: Home / Self Care | Payer: Medicare HMO | Source: Ambulatory Visit | Attending: Internal Medicine | Admitting: Internal Medicine

## 2018-06-15 ENCOUNTER — Other Ambulatory Visit: Payer: Self-pay | Admitting: Internal Medicine

## 2018-06-15 DIAGNOSIS — M7989 Other specified soft tissue disorders: Secondary | ICD-10-CM | POA: Insufficient documentation

## 2018-06-15 DIAGNOSIS — I82621 Acute embolism and thrombosis of deep veins of right upper extremity: Secondary | ICD-10-CM

## 2018-07-06 ENCOUNTER — Encounter: Payer: Self-pay | Admitting: Internal Medicine

## 2018-07-07 ENCOUNTER — Encounter: Payer: Self-pay | Admitting: Internal Medicine

## 2018-07-07 ENCOUNTER — Ambulatory Visit: Payer: Medicare HMO | Admitting: Internal Medicine

## 2018-07-07 VITALS — BP 104/78 | HR 105 | Ht 63.0 in | Wt 132.8 lb

## 2018-07-07 DIAGNOSIS — R000 Tachycardia, unspecified: Secondary | ICD-10-CM

## 2018-07-07 NOTE — Progress Notes (Signed)
ELECTROPHYSIOLOGY CONSULT NOTE  Patient ID: Doris [name], MRN: [id number], DOB/AGE: 09-26-1950 67 y.o. Admit date: (Not on file) Date of Consult: 07/07/2018  Primary Physician: Rusty Aus, MD Primary Cardiologist: Previously [facility]     Doris [name] is a 67 y.o. female who is being seen today to establish pacemaker follow-up at at the request of Dr. Emily Filbert.    HPI Doris [name] is a 67 y.o. female who underwent mitral valve repair for prolapse and mitral regurgitation at [facility].  Was complicated by complete heart block and she underwent postoperative CRT-P   Echocardiogram 2019 demonstrated moderate LV dysfunction EF 45%  She has had significant interval improvement in functional status.  Is been noted that her heart rate is fast.   Past Medical History:  Diagnosis Date  . Cancer (HCC)    Basal cell ( bridge of her nose)   . Headache    Migraines  . Hx of migraine headaches   . Medical history non-contributory    Mitral valve prolapse at 19 yrs of age      Surgical History:  Past Surgical History:  Procedure Laterality Date  . CARDIAC CATHETERIZATION    . CATARACT EXTRACTION  02/2017  . COLONOSCOPY WITH PROPOFOL N/A 03/03/2017   Procedure: COLONOSCOPY WITH PROPOFOL;  Surgeon: Garlan Fair, MD;  Location: [facility];  Service: Endoscopy;  Laterality: N/A;  . OOPHORECTOMY  2005     Home Meds: Current Meds  Medication Sig  . aspirin 81 MG chewable tablet Chew by mouth.  Marland Kitchen b complex vitamins tablet Take 0.5 tablets by mouth daily.  . Calcium-Magnesium 200-100 MG TABS Take 1 tablet by mouth 2 (two) times daily.  . Cholecalciferol (VITAMIN D3) LIQD Take 1 drop by mouth daily.   Javier Docker Oil 350 MG CAPS Take 350 mg by mouth.  Vladimir Faster Glycol-Propyl Glycol (SYSTANE ULTRA OP) Place 1 drop into the right eye every 2 (two) hours as needed (dry eyes).  . vitamin B-12 (CYANOCOBALAMIN) 1000 MCG tablet Take 1,000 mcg by mouth daily.     Allergies:  Allergies  Allergen Reactions  . Benzocaine Other (See Comments)    Peripheral Neuropathy  . Clarithromycin Other (See Comments)    PERIPHERAL NEUROPATHY  . Doxycycline Other (See Comments)    Stomach upset and Neuropathy  . Lidocaine Other (See Comments)    UNABLE TO SPEAK, Dr thinks he hit a blood vessal  . Metronidazole Other (See Comments)    PERIPHERAL NEUROPATHY  . 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 Marital status: Divorced    Spouse name: Not on file  . Number of children: Not on file  . Years of education: Not on file  . Highest education level: Not on file  Occupational History  . Not on file  Social Needs  . Financial resource strain: Not on file  . Food insecurity:    Worry: Not on file  Inability: Not on file  . Transportation needs:    Medical: Not on file    Non-medical: Not on file  Tobacco Use  . Smoking status: Former Smoker    Types: Cigarettes  . Smokeless tobacco: Never Used  Substance and Sexual Activity  . Alcohol use: Yes    Comment: Beer a month  . Drug use: No  . Sexual activity: Not Currently  Lifestyle  . Physical activity:    Days per week: Not on file    Minutes per session: Not on file  . Stress: Not on file  Relationships  . Social connections:    Talks on phone: Not on file    Gets together: Not on file    Attends religious service: Not on file    Active member of  club or organization: Not on file    Attends meetings of clubs or organizations: Not on file    Relationship status: Not on file  . Intimate partner violence:    Fear of current or ex partner: Not on file    Emotionally abused: Not on file    Physically abused: Not on file    Forced sexual activity: Not on file  Other Topics Concern  . Not on file  Social History Narrative  . Not on file     Family History  Problem Relation Age of Onset  . Diabetes Mother   . Cancer Mother   . Cancer Father      ROS:  Please see the history of present illness.     All other systems reviewed and negative.    Physical Exam: Blood pressure 104/78, pulse (!) 105, height 5\' 3"  (1.6 m), weight 132 lb 12.8 oz (60.2 kg), SpO2 98 %. General: Well developed, well nourished female in no acute distress. Head: Normocephalic, atraumatic, sclera non-icteric, no xanthomas, nares are without discharge. EENT: normal  Lymph Nodes:  none Neck: Negative for carotid bruits. JVD not elevated. Back:without scoliosis kyphosis Lungs: Clear bilaterally to auscultation without wheezes, rales, or rhonchi. Breathing is unlabored. Heart: RRR with S1 S2.  2/6 systolic murmur . No rubs, or gallops appreciated. Abdomen: Soft, non-tender, non-distended with normoactive bowel sounds. No hepatomegaly. No rebound/guarding. No obvious abdominal masses. Msk:  Strength and tone appear normal for age. Extremities: No clubbing or cyanosis. No  edema.  Distal pedal pulses are 2+ and equal bilaterally. Skin: Warm and Dry Neuro: Alert and oriented X 3. CN III-XII intact Grossly normal sensory and motor function . Psych:  Responds to questions appropriately with a normal affect.      Labs: Cardiac Enzymes No results for input(s): CKTOTAL, CKMB, TROPONINI in the last 72 hours. CBC Lab Results  Component Value Date   WBC 5.2 03/15/2018   HGB 14.4 03/15/2018   HCT 42.2 03/15/2018   MCV 88.7 03/15/2018   PLT 272 03/15/2018    PROTIME: No results for input(s): LABPROT, INR in the last 72 hours. Chemistry No results for input(s): NA, K, CL, CO2, BUN, CREATININE, CALCIUM, PROT, BILITOT, ALKPHOS, ALT, AST, GLUCOSE in the last 168 hours.  Invalid input(s): LABALBU Lipids No results found for: CHOL, HDL, LDLCALC, TRIG BNP No results found for: PROBNP Thyroid Function Tests: No results for input(s): TSH, T4TOTAL, T3FREE, THYROIDAB in the last 72 hours.  Invalid input(s): FREET3 Miscellaneous No results found for: DDIMER  Radiology/Studies:  US Venous Img Upper Uni Right  Result Date: 06/15/2018 CLINICAL DATA:  67 year old female with right upper extremity edema and palmar  discoloration for the past 6 weeks EXAM: RIGHT UPPER EXTREMITY VENOUS DOPPLER ULTRASOUND TECHNIQUE: Gray-scale sonography with graded compression, as well as color Doppler and duplex ultrasound were performed to evaluate the upper extremity deep venous system from the level of the subclavian vein and including the jugular, axillary, basilic, radial, ulnar and upper cephalic vein. Spectral Doppler was utilized to evaluate flow at rest and with distal augmentation maneuvers. COMPARISON:  None. FINDINGS: Contralateral Subclavian Vein: Respiratory phasicity is normal and symmetric with the symptomatic side. No evidence of thrombus. Normal compressibility. Internal Jugular Vein: No evidence of thrombus. Normal compressibility, respiratory phasicity and response to augmentation. Subclavian Vein: No evidence of thrombus. Normal compressibility, respiratory phasicity and response to augmentation. Axillary Vein: No evidence of thrombus. Normal compressibility, respiratory phasicity and response to augmentation. Cephalic Vein: No evidence of thrombus. Normal compressibility, respiratory phasicity and response to augmentation. Basilic Vein: No evidence of thrombus. Normal compressibility, respiratory phasicity and response to augmentation. Brachial Veins: No  evidence of thrombus. Normal compressibility, respiratory phasicity and response to augmentation. Radial Veins: No evidence of thrombus. Normal compressibility, respiratory phasicity and response to augmentation. Ulnar Veins: No evidence of thrombus. Normal compressibility, respiratory phasicity and response to augmentation. Venous Reflux:  None visualized. Other Findings:  None visualized. IMPRESSION: No evidence of DVT within the right upper extremity. Electronically Signed   By: Jacqulynn Cadet M.D.   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  She is taking aspirin.  She had transient postoperative complete heart block prompting implantation of CRT-P.  She has had interval resumption of conduction she has had persistent tachycardia although there is been a 10% leftward shift over the last 2 weeks.  She is previously not tolerated beta-blockers   As there is been improvement over the last couple weeks we will simply follow it for right now.   Virl Axe

## 2018-07-07 NOTE — Patient Instructions (Addendum)
Medication Instructions:  Your physician recommends that you continue on your current medications as directed. Please refer to the Current Medication list given to you today.  Labwork: None ordered.  Testing/Procedures: None ordered.  Follow-Up: Your physician recommends that you schedule a follow-up appointment in:   November 19 at 9:15am with Dr Caryl Comes [facility] [id number], [place], [place] [address]  Take the elevator to the 1st floor (it will feel like the basement)     Any Other Special Instructions Will Be Listed Below (If Applicable).     If you need a refill on your cardiac medications before your next appointment, please call your pharmacy.

## 2018-07-23 ENCOUNTER — Encounter (INDEPENDENT_AMBULATORY_CARE_PROVIDER_SITE_OTHER): Payer: Self-pay | Admitting: Vascular Surgery

## 2018-08-05 ENCOUNTER — Ambulatory Visit (INDEPENDENT_AMBULATORY_CARE_PROVIDER_SITE_OTHER): Payer: Medicare HMO | Admitting: Vascular Surgery

## 2018-08-05 ENCOUNTER — Encounter (INDEPENDENT_AMBULATORY_CARE_PROVIDER_SITE_OTHER): Payer: Self-pay | Admitting: Vascular Surgery

## 2018-08-05 DIAGNOSIS — Z87891 Personal history of nicotine dependence: Secondary | ICD-10-CM

## 2018-08-05 DIAGNOSIS — R0989 Other specified symptoms and signs involving the circulatory and respiratory systems: Secondary | ICD-10-CM

## 2018-08-07 ENCOUNTER — Encounter (INDEPENDENT_AMBULATORY_CARE_PROVIDER_SITE_OTHER): Payer: Self-pay | Admitting: Vascular Surgery

## 2018-08-07 DIAGNOSIS — R0989 Other specified symptoms and signs involving the circulatory and respiratory systems: Secondary | ICD-10-CM | POA: Insufficient documentation

## 2018-08-07 NOTE — Progress Notes (Signed)
MRN : [id number]  Doris [name] is a 67 y.o. (10-06-1950) female who presents with chief complaint of  Chief Complaint  Patient presents with  . New Patient (Initial Visit)    ref Doris [name] for right ue varicose vein  .  History of Present Illness:  The patient is seen for evaluation of painful veins on the hands. The patient relates burning and stinging which worsened steadily throughout the course of the day. The patient also notes an aching and throbbing pain over the vein, particularly with pressure or using her hand.   There is no history of DVT, PE or superficial thrombophlebitis. There is no history of ulceration or hemorrhage. The patient denies a significant family history of varicose veins.  At the present time the patient has not been using over-the-counter analgesics. There is no history of prior surgical intervention or sclerotherapy.    Current Meds  Medication Sig  . aspirin 81 MG chewable tablet Chew by mouth.  Marland Kitchen b complex vitamins tablet Take 0.5 tablets by mouth daily.  . Calcium-Magnesium 200-100 MG TABS Take 1 tablet by mouth 2 (two) times daily.  . Cholecalciferol (VITAMIN D3) LIQD Take 1 drop by mouth daily.   Doris [name] Oil 350 MG CAPS Take 350 mg by mouth.  Vladimir Faster Glycol-Propyl Glycol (SYSTANE ULTRA OP) Place 1 drop into the right eye every 2 (two) hours as needed (dry eyes).  . vitamin B-12 (CYANOCOBALAMIN) 1000 MCG tablet Take 1,000 mcg by mouth daily.    Past Medical History:  Diagnosis Date  . Cancer (HCC)    Basal cell ( bridge of her nose)   . Headache    Migraines  . Hx of migraine headaches   . Medical history non-contributory    Mitral valve prolapse at 17 yrs of age    Past Surgical History:  Procedure Laterality Date  . CARDIAC CATHETERIZATION    . CATARACT EXTRACTION  02/2017  . COLONOSCOPY WITH PROPOFOL N/A 03/03/2017   Procedure: COLONOSCOPY WITH PROPOFOL;  Surgeon: Garlan Fair, MD;  Location: [facility];  Service:  Endoscopy;  Laterality: N/A;  . OOPHORECTOMY  2005    Social History Social History   Tobacco Use  . Smoking status: Former Smoker    Types: Cigarettes  . Smokeless tobacco: Never Used  Substance Use Topics  . Alcohol use: Yes    Comment: Beer a month  . Drug use: No    Family History Family History  Problem Relation Age of Onset  . Diabetes Mother   . Cancer Mother   . Cancer Father   No family history of bleeding/clotting disorders, porphyria or autoimmune disease   Allergies  Allergen Reactions  . Benzocaine Other (See Comments)    Peripheral Neuropathy  . Clarithromycin Other (See Comments)    PERIPHERAL NEUROPATHY  . Doxycycline Other (See Comments)    Stomach upset and Neuropathy  . Lidocaine Other (See Comments)    UNABLE TO SPEAK, Dr thinks he hit a blood vessal  . Metronidazole Other (See Comments)    PERIPHERAL NEUROPATHY  . 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- she was 68 years old Has patient had a PCN reaction causing immediate rash, facial/tongue/throat swelling, SOB or lightheadedness with hypotension: Unknown Has patient had a PCN reaction causing severe rash involving mucus membranes or skin necrosis: Unknown Has patient had a PCN reaction that required hospitalization: Yes Has patient had a PCN reaction occurring within the  last 10 years: No If all of the above answers are "NO", then may proceed with Cephalos     REVIEW OF SYSTEMS (Negative unless checked)  Constitutional: [] Weight loss  [] Fever  [] Chills Cardiac: [] Chest pain   [] Chest pressure   [] Palpitations   [] Shortness of breath when laying flat   [] Shortness of breath with exertion. Vascular:  [] Pain in legs with walking   [] Pain in legs at rest  [] History of DVT   [] Phlebitis   [] Swelling in legs   [] Varicose veins   [] Non-healing ulcers Pulmonary:   [] Uses home oxygen   [] Productive cough   [] Hemoptysis   [] Wheeze  [] COPD   [] Asthma Neurologic:   [] Dizziness   [] Seizures   [] History of stroke   [] History of TIA  [] Aphasia   [] Vissual changes   [] Weakness or numbness in arm   [] Weakness or numbness in leg Musculoskeletal:   [] Joint swelling   [x] Joint pain   [] Low back pain Hematologic:  [] Easy bruising  [] Easy bleeding   [] Hypercoagulable state   [] Anemic Gastrointestinal:  [] Diarrhea   [] Vomiting  [] Gastroesophageal reflux/heartburn   [] Difficulty swallowing. Genitourinary:  [] Chronic kidney disease   [] Difficult urination  [] Frequent urination   [] Blood in urine Skin:  [] Rashes   [] Ulcers  Psychological:  [] History of anxiety   []  History of major depression.  Physical Examination  Vitals:   08/05/18 0930  BP: 132/82  Pulse: 100  Resp: 17  Weight: 138 lb 8 oz (62.8 kg)  Height: 5' 3.5" (1.613 m)   Body mass index is 24.15 kg/m. Gen: WD/WN, NAD Head: [place]/AT, No temporalis wasting.  Ear/Nose/Throat: Hearing grossly intact, nares w/o erythema or drainage, poor dentition Eyes: PER, EOMI, sclera nonicteric.  Neck: Supple, no masses.  No bruit or JVD.  Pulmonary:  Good air movement, clear to auscultation bilaterally, no use of accessory muscles.  Cardiac: RRR, normal S1, S2, no Murmurs. Vascular: prominent but normal veins in the hands Vessel Right Left  Radial Palpable Palpable  Brachial Palpable Palpable  Gastrointestinal: soft, non-distended. No guarding/no peritoneal signs.  Musculoskeletal: M/S 5/5 throughout.  No deformity or atrophy.  Neurologic: CN 2-12 intact. Pain and light touch intact in extremities.  Symmetrical.  Speech is fluent. Motor exam as listed above. Psychiatric: Judgment intact, Mood & affect appropriate for pt's clinical situation. Dermatologic: No rashes or ulcers noted.  No changes consistent with cellulitis. Lymph : No Cervical lymphadenopathy, no lichenification or skin changes of chronic lymphedema.  CBC Lab Results  Component Value Date   WBC 5.2 03/15/2018   HGB 14.4 03/15/2018   HCT 42.2  03/15/2018   MCV 88.7 03/15/2018   PLT 272 03/15/2018    BMET    Component Value Date/Time   NA 134 (L) 03/15/2018 0522   NA 141 10/05/2011 0823   K 3.9 03/15/2018 0522   K 3.7 10/05/2011 0823   CL 103 03/15/2018 0522   CL 106 10/05/2011 0823   CO2 23 03/15/2018 0522   CO2 26 10/05/2011 0823   GLUCOSE 118 (H) 03/15/2018 0522   GLUCOSE 110 (H) 10/05/2011 0823   BUN 10 03/15/2018 0522   BUN 18 10/05/2011 0823   CREATININE 0.59 03/15/2018 0522   CREATININE 0.77 10/05/2011 0823   CALCIUM 9.4 03/15/2018 0522   CALCIUM 8.8 10/05/2011 0823   GFRNONAA >60 03/15/2018 0522   GFRNONAA >60 10/05/2011 0823   GFRAA >60 03/15/2018 0522   GFRAA >60 10/05/2011 0823   CrCl cannot be calculated (Patient's most recent lab  result is older than the maximum 21 days allowed.).  COAG No results found for: INR, PROTIME  Radiology No results found.   Assessment/Plan 1. Painful veins Recommend:  The patient is complaining of painful veins.    I have had a long discussion with the patient regarding  veins and why they cause symptoms.  No treatment indicated at this time.    The patient  will also begin using over-the-counter analgesics such as Motrin 600 mg po TID to help control the symptoms as needed.    In addition, behavioral modification including elevation during the day will be initiated, utilizing a recliner was recommended.  The patient is also instructed to continue exercising such as walking 4-5 times per week.  At this time the patient wishes to continue conservative therapy.  The Patient will follow up PRN if the symptoms worsen.    Hortencia Pilar, MD  08/07/2018 12:00 PM

## 2018-08-10 ENCOUNTER — Encounter: Payer: Self-pay | Admitting: Internal Medicine

## 2018-08-10 ENCOUNTER — Ambulatory Visit: Payer: Medicare HMO | Admitting: Internal Medicine

## 2018-08-10 VITALS — BP 104/70 | HR 108 | Ht 63.0 in | Wt 133.0 lb

## 2018-08-10 DIAGNOSIS — Z950 Presence of cardiac pacemaker: Secondary | ICD-10-CM

## 2018-08-10 DIAGNOSIS — I059 Rheumatic mitral valve disease, unspecified: Secondary | ICD-10-CM

## 2018-08-10 DIAGNOSIS — I429 Cardiomyopathy, unspecified: Secondary | ICD-10-CM

## 2018-08-10 DIAGNOSIS — I442 Atrioventricular block, complete: Secondary | ICD-10-CM

## 2018-08-10 NOTE — Patient Instructions (Addendum)
Medication Instructions:  - Your physician recommends that you continue on your current medications as directed. Please refer to the Current Medication list given to you today.  If you need a refill on your cardiac medications before your next appointment, please call your pharmacy.   Lab work: - none ordered  If you have labs (blood work) drawn today and your tests are completely normal, you will receive your results only by: Marland Kitchen MyChart Message (if you have MyChart) OR . A paper copy in the mail If you have any lab test that is abnormal or we need to change your treatment, we will call you to review the results.  Testing/Procedures: - none ordered  Follow-Up: At [facility], you and your health needs are our priority.  As part of our continuing mission to provide you with exceptional heart care, we have created designated Provider Care Teams.  These Care Teams include your primary Cardiologist (physician) and Advanced Practice Providers (APPs -  Physician Assistants and Nurse Practitioners) who all work together to provide you with the care you need, when you need it. . 3 months with Dr. Caryl Comes  Any Other Special Instructions Will Be Listed Below (If Applicable). - n/a

## 2018-08-10 NOTE — Progress Notes (Signed)
Patient Care Team: Rusty Aus, MD as PCP - General (Internal Medicine)   HPI  Doris [name] is a 67 y.o. female  Hx mitral valve repair 7/19 for prolapse and mitral regurgitation at [facility] complicated by complete heart block; she underwent postoperative CRT-P 8/19.  Seen 10/19 w interval return of conduction.     DATE TEST EF   6/19 Echo   50 % MR Mod-Severe  9/19 Echo   40 % MR none         Noted to have sinus tach post operative-- was improving 10/19 so elected to follow. Has also seen Dr Mare Ferrari -- notes reviewed   She has been feeling better.  She notes some variability in her blood pressure-110--130s.  There seems to be an inverse correlation with heart rate.  No chest pain shortness of breath.  She has noted some discoloration of her lower extremities since her surgery    Past Medical History:  Diagnosis Date  . Cancer (HCC)    Basal cell ( bridge of her nose)   . Headache    Migraines  . Hx of migraine headaches   . Medical history non-contributory    Mitral valve prolapse at 7 yrs of age    Past Surgical History:  Procedure Laterality Date  . CARDIAC CATHETERIZATION    . CATARACT EXTRACTION  02/2017  . COLONOSCOPY WITH PROPOFOL N/A 03/03/2017   Procedure: COLONOSCOPY WITH PROPOFOL;  Surgeon: Garlan Fair, MD;  Location: [facility];  Service: Endoscopy;  Laterality: N/A;  . OOPHORECTOMY  2005    Current Meds  Medication Sig  . aspirin 81 MG chewable tablet Chew by mouth.  Marland Kitchen b complex vitamins tablet Take 0.5 tablets by mouth daily.  . Calcium-Magnesium 200-100 MG TABS Take 1 tablet by mouth 2 (two) times daily.  . Cholecalciferol (VITAMIN D3) LIQD Take 1 drop by mouth daily.   Javier Docker Oil 350 MG CAPS Take 350 mg by mouth.  Vladimir Faster Glycol-Propyl Glycol (SYSTANE ULTRA OP) Place 1 drop into the right eye every 2 (two) hours as needed (dry eyes).  . vitamin B-12 (CYANOCOBALAMIN) 1000 MCG tablet Take 1,000 mcg by mouth daily.     Allergies  Allergen Reactions  . Benzocaine Other (See Comments)    Peripheral Neuropathy  . Clarithromycin Other (See Comments)    PERIPHERAL NEUROPATHY  . Doxycycline Other (See Comments)    Stomach upset and Neuropathy  . Lidocaine Other (See Comments)    UNABLE TO SPEAK, Dr thinks he hit a blood vessal  . Metronidazole Other (See Comments)    PERIPHERAL NEUROPATHY  . 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- she was 68 years old Has patient had a PCN reaction causing immediate rash, facial/tongue/throat swelling, SOB or lightheadedness with hypotension: Unknown Has patient had a PCN reaction causing severe rash involving mucus membranes or skin necrosis: Unknown Has patient had a PCN reaction that required hospitalization: Yes Has patient had a PCN reaction occurring within the last 10 years: No If all of the above answers are "NO", then may proceed with Cephalos      Review of Systems negative except from HPI and PMH  Physical Exam BP 104/70 (BP Location: Left Arm, Patient Position: Sitting, Cuff Size: Normal)   Pulse (!) 108   Ht 5\' 3"  (1.6 m)   Wt 133 lb (60.3 kg)   BMI 23.56 kg/m  Well developed and well  nourished in no acute distress HENT normal E scleral and icterus clear Neck Supple JVP flat; carotids brisk and full Clear to ausculation Regular rate and rhythm, no murmurs gallops or rub Soft with active bowel sounds No clubbing cyanosis  Edema Alert and oriented, grossly normal motor and sensory function Skin Warm and Dry  ECG demonstrates probable sinus rhythm at 108 (see below)  Assessment and  Plan  Sinus tachycardia  Mitral valve repair  Complete heart block-transient  CRT-P-Medtronic  Cardio myopathy-mild  Beta-blocker intolerance   The patient has persistent albeit somewhat less rapid tachycardia.  There is a gradual trend noted over the last couple of months in particular over the last couple of  weeks.  The P wave morphology is concerning.  It is upright in lead V1 today suggesting a different focus from her ECGs in 2013 and May 2018 where it was negative.  However, when seen October 19, it was biphasic positive negative, predominantly negative still with the tachycardia.  I wonder if it is just lead placement.  We do have to be concerned about the possibility of an atrial tachycardia  We will plan to review rates in 3 months  We spent more than 50% of our >25 min visit in face to face counseling regarding the above       Current medicines are reviewed at length with the patient today .  The patient does not  have concerns regarding medicines.

## 2018-11-08 NOTE — Progress Notes (Signed)
Patient Care Team: Rusty Aus, MD as PCP - General (Internal Medicine)   HPI  Doris [name] is a 68 y.o. female in follow-up for intermittent complete heart block following mitral valve surgery at [facility].  Hx mitral valve repair 7/19 for prolapse and mitral regurgitation at [facility] complicated by complete heart block; she underwent postoperative CRT-P 8/19.  Seen 10/19 w interval return of conduction.     We reviewed a comment that she said I made at our initial visit regarding torsade de pointes having occurred at [facility].  She says that I said it did not occur.  Reviewed the notes today and the ECGs certainly it sounds like it did occur with QT prolongation and a long short sequence  DATE TEST EF   6/19 Echo   50 % MR Mod-Severe  9/19 Echo   40 % MR none        She has been doing well overall, however, she notes recent migraines. She adds her energy levels fluctuate depending on the day. She has not been able to walk as much lately because of recurrent pelvic floor issues. Adding she had surgery to help but it has since returned. She endorses low back pain, which makes it harder for her to run at faster speeds.   She does not expereince SOB when bring in groceries. She sleeps on a wedge because her nostril will close, otherwise she does not have trouble sleeping due to SOB.   She denies lower extremity edema. No chest pain    Past Medical History:  Diagnosis Date   Cancer ([place])    Basal cell ( bridge of her nose)    Headache    Migraines   Hx of migraine headaches    Medical history non-contributory    Mitral valve prolapse at 19 yrs of age    Past Surgical History:  Procedure Laterality Date   CARDIAC CATHETERIZATION     CATARACT EXTRACTION  02/2017   COLONOSCOPY WITH PROPOFOL N/A 03/03/2017   Procedure: COLONOSCOPY WITH PROPOFOL;  Surgeon: Garlan Fair, MD;  Location: [facility];  Service: Endoscopy;  Laterality: N/A;   OOPHORECTOMY  2005     Current Meds  Medication Sig   aspirin 81 MG chewable tablet Chew by mouth.   b complex vitamins tablet Take 0.5 tablets by mouth daily.   Calcium-Magnesium 200-100 MG TABS Take 1 tablet by mouth 2 (two) times daily.   Cholecalciferol (VITAMIN D3) LIQD Take 1 drop by mouth daily.    Krill Oil 350 MG CAPS Take 350 mg by mouth.   Polyethyl Glycol-Propyl Glycol (SYSTANE ULTRA OP) Place 1 drop into the right eye every 2 (two) hours as needed (dry eyes).   vitamin B-12 (CYANOCOBALAMIN) 1000 MCG tablet Take 1,000 mcg by mouth daily.    Allergies  Allergen Reactions   Benzocaine Other (See Comments)    Peripheral Neuropathy   Clarithromycin Other (See Comments)    PERIPHERAL NEUROPATHY   Doxycycline Other (See Comments)    Stomach upset and Neuropathy   Lidocaine Other (See Comments)    UNABLE TO SPEAK, Dr thinks he hit a blood vessal   Metronidazole Other (See Comments)    PERIPHERAL NEUROPATHY   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 Has patient had a PCN reaction causing immediate rash, facial/tongue/throat swelling, SOB or lightheadedness with hypotension: Unknown Has patient had a PCN reaction causing  severe rash involving mucus membranes or skin necrosis: Unknown Has patient had a PCN reaction that required hospitalization: Yes Has patient had a PCN reaction occurring within the last 10 years: No If all of the above answers are "NO", then may proceed with Cephalos     REVIEW OF SYSTEMS Review of Systems negative except from HPI and PMH  PHYSICAL EXAM BP 122/87 (BP Location: Left Arm, Patient Position: Sitting, Cuff Size: Normal)    Pulse 99    Ht 5\' 3"  (1.6 m)    Wt 138 lb 8 oz (62.8 kg)    BMI 24.53 kg/m  Well developed and well nourished in no acute distress HENT normal Neck supple with JVP-flat Clear Device pocket well healed; without hematoma or erythema.  There is no tethering  Regular rate  and rhythm, no  gallop No  murmur Abd-soft with active BS No Clubbing cyanosis edema Skin-warm and dry A & Oriented  Grossly normal sensory and motor function    ECG  Sinus @ 99 13/07/35    ASSESSMENT AND PLAN  Sinus tachycardia  Mitral valve repair  Complete heart block-transient  CRT-P-Medtronic  Cardio myopathy-mild  Beta-blocker intolerance  No significant pacing.  We will undertake echocardiogram to look at LV function.  Based on the SOLVD would anticipate if she has depressed LV function that she would benefit from ACE inhibitor therapy.  She is agreeable.  Euvolemic continue current meds  We spent more than 50% of our >25 min visit in face to face counseling regarding the above    Current medicines are reviewed at length with the patient today. The patient does not  have concerns regarding medicines.  I, Margit Banda am acting as a Education administrator for Virl Axe, M.D. I have reviewed the above documentation for accuracy and completeness, and I agree with the above.   Signed, Virl Axe, MD 11/09/18 [place], [place]

## 2018-11-09 ENCOUNTER — Encounter: Payer: Self-pay | Admitting: Internal Medicine

## 2018-11-09 ENCOUNTER — Ambulatory Visit: Payer: Medicare HMO | Admitting: Internal Medicine

## 2018-11-09 ENCOUNTER — Ambulatory Visit: Payer: Medicare HMO

## 2018-11-09 VITALS — BP 122/87 | HR 99 | Ht 63.0 in | Wt 138.5 lb

## 2018-11-09 DIAGNOSIS — I429 Cardiomyopathy, unspecified: Secondary | ICD-10-CM | POA: Diagnosis not present

## 2018-11-09 DIAGNOSIS — I059 Rheumatic mitral valve disease, unspecified: Secondary | ICD-10-CM

## 2018-11-09 DIAGNOSIS — R000 Tachycardia, unspecified: Secondary | ICD-10-CM | POA: Diagnosis not present

## 2018-11-09 DIAGNOSIS — Z950 Presence of cardiac pacemaker: Secondary | ICD-10-CM

## 2018-11-09 DIAGNOSIS — I442 Atrioventricular block, complete: Secondary | ICD-10-CM | POA: Diagnosis not present

## 2018-11-09 NOTE — Patient Instructions (Addendum)
Medication Instructions:  - Your physician recommends that you continue on your current medications as directed. Please refer to the Current Medication list given to you today.  If you need a refill on your cardiac medications before your next appointment, please call your pharmacy.   Lab work: - none ordered  If you have labs (blood work) drawn today and your tests are completely normal, you will receive your results only by: Marland Kitchen MyChart Message (if you have MyChart) OR . A paper copy in the mail If you have any lab test that is abnormal or we need to change your treatment, we will call you to review the results.  Testing/Procedures: - Your physician has requested that you have an echocardiogram. Echocardiography is a painless test that uses sound waves to create images of your heart. It provides your doctor with information about the size and shape of your heart and how well your heart's chambers and valves are working. This procedure takes approximately one hour. There are no restrictions for this procedure.   Follow-Up: At [facility], you and your health needs are our priority.  As part of our continuing mission to provide you with exceptional heart care, we have created designated Provider Care Teams.  These Care Teams include your primary Cardiologist (physician) and Advanced Practice Providers (APPs -  Physician Assistants and Nurse Practitioners) who all work together to provide you with the care you need, when you need it. . You will need a follow up appointment in 6 months with Dr. Caryl Comes.  Please call our office 2 months in advance to schedule this appointment.    Remote monitoring is used to monitor your Pacemaker of ICD from home. This monitoring reduces the number of office visits required to check your device to one time per year. It allows [place] to keep an eye on the functioning of your device to ensure it is working properly. You are scheduled for a device check from home on  02/08/19. You may send your transmission at any time that day. If you have a wireless device, the transmission will be sent automatically. After your physician reviews your transmission, you will receive a postcard with your next transmission date.   Any Other Special Instructions Will Be Listed Below (If Applicable). - N/A   Echocardiogram An echocardiogram is a procedure that uses painless sound waves (ultrasound) to produce an image of the heart. Images from an echocardiogram can provide important information about:  Signs of coronary artery disease (CAD).  Aneurysm detection. An aneurysm is a weak or damaged part of an artery wall that bulges out from the normal force of blood pumping through the body.  Heart size and shape. Changes in the size or shape of the heart can be associated with certain conditions, including heart failure, aneurysm, and CAD.  Heart muscle function.  Heart valve function.  Signs of a past heart attack.  Fluid buildup around the heart.  Thickening of the heart muscle.  A tumor or infectious growth around the heart valves. Tell a health care provider about:  Any allergies you have.  All medicines you are taking, including vitamins, herbs, eye drops, creams, and over-the-counter medicines.  Any blood disorders you have.  Any surgeries you have had.  Any medical conditions you have.  Whether you are pregnant or may be pregnant. What are the risks? Generally, this is a safe procedure. However, problems may occur, including:  Allergic reaction to dye (contrast) that may be used during the  procedure. What happens before the procedure? No specific preparation is needed. You may eat and drink normally. What happens during the procedure?   An IV tube may be inserted into one of your veins.  You may receive contrast through this tube. A contrast is an injection that improves the quality of the pictures from your heart.  A gel will be applied to  your chest.  A wand-like tool (transducer) will be moved over your chest. The gel will help to transmit the sound waves from the transducer.  The sound waves will harmlessly bounce off of your heart to allow the heart images to be captured in real-time motion. The images will be recorded on a computer. The procedure may vary among health care providers and hospitals. What happens after the procedure?  You may return to your normal, everyday life, including diet, activities, and medicines, unless your health care provider tells you not to do that. Summary  An echocardiogram is a procedure that uses painless sound waves (ultrasound) to produce an image of the heart.  Images from an echocardiogram can provide important information about the size and shape of your heart, heart muscle function, heart valve function, and fluid buildup around your heart.  You do not need to do anything to prepare before this procedure. You may eat and drink normally.  After the echocardiogram is completed, you may return to your normal, everyday life, unless your health care provider tells you not to do that. This information is not intended to replace advice given to you by your health care provider. Make sure you discuss any questions you have with your health care provider. Document Released: 09/05/2000 Document Revised: 10/11/2016 Document Reviewed: 10/11/2016 Elsevier Interactive Patient Education  2019 [employer].

## 2018-11-11 LAB — CUP PACEART REMOTE DEVICE CHECK
Battery Voltage: 3.07 V
Brady Statistic AP VP Percent: 0.47 %
Brady Statistic RA Percent Paced: 0.43 %
Brady Statistic RV Percent Paced: 0.56 %
Date Time Interrogation Session: 20200218121634
Implantable Lead Location: 753860
Implantable Lead Serial Number: 11111
Lead Channel Impedance Value: 380 Ohm
Lead Channel Impedance Value: 437 Ohm
Lead Channel Impedance Value: 513 Ohm
Lead Channel Impedance Value: 513 Ohm
Lead Channel Impedance Value: 665 Ohm
Lead Channel Impedance Value: 665 Ohm
Lead Channel Impedance Value: 836 Ohm
Lead Channel Impedance Value: 836 Ohm
Lead Channel Pacing Threshold Amplitude: 0.5 V
Lead Channel Pacing Threshold Pulse Width: 0.4 ms
Lead Channel Sensing Intrinsic Amplitude: 14 mV
Lead Channel Sensing Intrinsic Amplitude: 2.125 mV
Lead Channel Setting Pacing Amplitude: 1.5 V
Lead Channel Setting Pacing Amplitude: 2 V
Lead Channel Setting Sensing Sensitivity: 2 mV
MDC IDC LEAD IMPLANT DT: 20190805
MDC IDC LEAD IMPLANT DT: 20190805
MDC IDC LEAD IMPLANT DT: 20190805
MDC IDC LEAD LOCATION: 753858
MDC IDC LEAD LOCATION: 753859
MDC IDC MSMT BATTERY REMAINING LONGEVITY: 157 mo
MDC IDC MSMT LEADCHNL LV IMPEDANCE VALUE: 323 Ohm
MDC IDC MSMT LEADCHNL LV IMPEDANCE VALUE: 475 Ohm
MDC IDC MSMT LEADCHNL LV IMPEDANCE VALUE: 494 Ohm
MDC IDC MSMT LEADCHNL LV IMPEDANCE VALUE: 494 Ohm
MDC IDC MSMT LEADCHNL LV IMPEDANCE VALUE: 665 Ohm
MDC IDC MSMT LEADCHNL LV PACING THRESHOLD AMPLITUDE: 1.125 V
MDC IDC MSMT LEADCHNL LV PACING THRESHOLD PULSEWIDTH: 0.4 ms
MDC IDC MSMT LEADCHNL RA IMPEDANCE VALUE: 304 Ohm
MDC IDC MSMT LEADCHNL RA PACING THRESHOLD AMPLITUDE: 0.625 V
MDC IDC MSMT LEADCHNL RA PACING THRESHOLD PULSEWIDTH: 0.4 ms
MDC IDC MSMT LEADCHNL RA SENSING INTR AMPL: 2.125 mV
MDC IDC MSMT LEADCHNL RV SENSING INTR AMPL: 14 mV
MDC IDC PG IMPLANT DT: 20190805
MDC IDC SET LEADCHNL RV PACING PULSEWIDTH: 0.4 ms
MDC IDC STAT BRADY AP VS PERCENT: 0.2 %
MDC IDC STAT BRADY AS VP PERCENT: 0.09 %
MDC IDC STAT BRADY AS VS PERCENT: 99.24 %

## 2018-11-11 NOTE — Addendum Note (Signed)
Addended by: Janan Ridge on: 11/11/2018 08:13 AM   Modules accepted: Orders

## 2018-11-22 ENCOUNTER — Encounter: Payer: Self-pay | Admitting: Cardiology

## 2018-12-07 ENCOUNTER — Other Ambulatory Visit: Payer: Medicare HMO

## 2018-12-09 ENCOUNTER — Other Ambulatory Visit: Payer: Medicare HMO

## 2019-02-08 ENCOUNTER — Other Ambulatory Visit: Payer: Self-pay

## 2019-02-08 ENCOUNTER — Ambulatory Visit (INDEPENDENT_AMBULATORY_CARE_PROVIDER_SITE_OTHER): Payer: Medicare HMO | Admitting: *Deleted

## 2019-02-08 DIAGNOSIS — I442 Atrioventricular block, complete: Secondary | ICD-10-CM | POA: Diagnosis not present

## 2019-02-09 LAB — CUP PACEART REMOTE DEVICE CHECK
Battery Remaining Longevity: 158 mo
Battery Voltage: 3.05 V
Brady Statistic AP VP Percent: 0.53 %
Brady Statistic AP VS Percent: 1.45 %
Brady Statistic AS VP Percent: 0.06 %
Brady Statistic AS VS Percent: 97.97 %
Brady Statistic RA Percent Paced: 1.8 %
Brady Statistic RV Percent Paced: 0.59 %
Date Time Interrogation Session: 20200519052315
Implantable Lead Implant Date: 20190805
Implantable Lead Implant Date: 20190805
Implantable Lead Implant Date: 20190805
Implantable Lead Location: 753858
Implantable Lead Location: 753859
Implantable Lead Location: 753860
Implantable Lead Model: 4398
Implantable Lead Model: 5076
Implantable Lead Model: 5076
Implantable Lead Serial Number: 11111
Implantable Pulse Generator Implant Date: 20190805
Lead Channel Impedance Value: 323 Ohm
Lead Channel Impedance Value: 342 Ohm
Lead Channel Impedance Value: 361 Ohm
Lead Channel Impedance Value: 437 Ohm
Lead Channel Impedance Value: 456 Ohm
Lead Channel Impedance Value: 456 Ohm
Lead Channel Impedance Value: 456 Ohm
Lead Channel Impedance Value: 475 Ohm
Lead Channel Impedance Value: 513 Ohm
Lead Channel Impedance Value: 608 Ohm
Lead Channel Impedance Value: 646 Ohm
Lead Channel Impedance Value: 665 Ohm
Lead Channel Impedance Value: 760 Ohm
Lead Channel Impedance Value: 760 Ohm
Lead Channel Pacing Threshold Amplitude: 0.375 V
Lead Channel Pacing Threshold Amplitude: 0.625 V
Lead Channel Pacing Threshold Amplitude: 1.125 V
Lead Channel Pacing Threshold Pulse Width: 0.4 ms
Lead Channel Pacing Threshold Pulse Width: 0.4 ms
Lead Channel Pacing Threshold Pulse Width: 0.4 ms
Lead Channel Sensing Intrinsic Amplitude: 12.75 mV
Lead Channel Sensing Intrinsic Amplitude: 12.75 mV
Lead Channel Sensing Intrinsic Amplitude: 2.625 mV
Lead Channel Sensing Intrinsic Amplitude: 2.625 mV
Lead Channel Setting Pacing Amplitude: 1.5 V
Lead Channel Setting Pacing Amplitude: 2 V
Lead Channel Setting Pacing Pulse Width: 0.4 ms
Lead Channel Setting Sensing Sensitivity: 2 mV

## 2019-02-17 ENCOUNTER — Encounter: Payer: Self-pay | Admitting: Cardiology

## 2019-02-17 NOTE — Progress Notes (Signed)
Remote pacemaker transmission.   

## 2019-02-18 ENCOUNTER — Telehealth: Payer: Self-pay | Admitting: Internal Medicine

## 2019-02-18 NOTE — Telephone Encounter (Signed)
New message:    Patient calling stating that you read her home remote check. Please give patient a call back.

## 2019-02-18 NOTE — Telephone Encounter (Signed)
Pt called. Questions about remote transmission in My Chart. Pt concerned because encounter summary was shorter than previous reports in My Chart. Pt has had no cp, SHOB , or syncope. No new sx. Pt is exercising more and walking 4 miles a day a "few" times a week. Pt reports sleeping better and able to sleep flat and not on multiple pillows. Remote transmission stable from previous. Pt reassured.

## 2019-03-07 ENCOUNTER — Telehealth: Payer: Self-pay | Admitting: Internal Medicine

## 2019-03-07 NOTE — Telephone Encounter (Signed)
Notes recorded by Deboraha Sprang, MD on 03/06/2019 at 4:50 PM EDT  Device remote reviewed.  Remote is normal. Battery status is good. Lead measurements unchanged. Histograms are appropriate.  Nonsustained VT present  Atrial tach also prsent   H Would you plz check on her clinical status   With new VT NS want to make sure all is ok   thx SK

## 2019-03-07 NOTE — Telephone Encounter (Signed)
I spoke with the patient regarding her remote transmission.   She states she has been doing well. She has been walking about 4 miles a day (a 2 mile walk, then gardening/ mowing).  She is not winded. She wears a FitBit and tries to keep an eye on her HR if it gets >120.   She states there has been only a couple of days when she has felt dizzy.  This was very brief, lasting seconds and she does not recall when it was.  Overall she says she is feeling well. She states if dizziness occurs again, she will write down when this occurs. She is scheduled for a repeat echo on 03/18/19.   To Dr. Caryl Comes to review.

## 2019-03-09 NOTE — Telephone Encounter (Signed)
Patient is returning the call regarding her remote transmission

## 2019-03-09 NOTE — Telephone Encounter (Signed)
Pt reports not being able to sleep on 01/28/19 & 01/06/19, does not appear that episodes correlate with lack of sleep. Pt has no further questions at this time.

## 2019-03-09 NOTE — Telephone Encounter (Signed)
Noted  

## 2019-03-12 ENCOUNTER — Telehealth: Payer: Self-pay | Admitting: Cardiovascular Disease

## 2019-03-12 NOTE — Telephone Encounter (Signed)
I was on call at [facility] this weekend. Call this am from Doris [name]. She has had left shoulder pain after gardening. She has been tired. Concerned about being told she had atrial fib on monitor by Dr. Olin Pia staff last week and is worried. Her Fit bit last showed that her heart rate did not go below 80 last night. Occasional heartburn. She feels well this am.   I told her to follow her BP this weekend and Dr. Caryl Comes will call next week.   Doris [name] 03/12/2019 8:57 AM

## 2019-03-17 ENCOUNTER — Telehealth: Payer: Self-pay | Admitting: Internal Medicine

## 2019-03-17 NOTE — Telephone Encounter (Signed)

## 2019-03-18 ENCOUNTER — Ambulatory Visit (INDEPENDENT_AMBULATORY_CARE_PROVIDER_SITE_OTHER): Payer: Medicare HMO

## 2019-03-18 ENCOUNTER — Other Ambulatory Visit: Payer: Self-pay

## 2019-03-18 DIAGNOSIS — I348 Other nonrheumatic mitral valve disorders: Secondary | ICD-10-CM | POA: Diagnosis not present

## 2019-03-18 DIAGNOSIS — I429 Cardiomyopathy, unspecified: Secondary | ICD-10-CM

## 2019-03-18 NOTE — Progress Notes (Unsigned)
   Patient ID: Doris [name], female    DOB: Nov 20, 1950, 68 y.o.   MRN: [id number]  Given the patient's reduced LVEF, a Secure Chat message was sent to Dr C. End (DOD) with the preliminary results of the echocardiogram. Patient denied dyspnea and/or fatigue tow times during todays exam.     Review of Systems    Physical Exam

## 2019-03-24 ENCOUNTER — Telehealth: Payer: Self-pay | Admitting: Internal Medicine

## 2019-03-24 ENCOUNTER — Telehealth: Payer: Self-pay

## 2019-03-24 DIAGNOSIS — I442 Atrioventricular block, complete: Secondary | ICD-10-CM

## 2019-03-24 MED ORDER — LOSARTAN POTASSIUM 25 MG PO TABS: 25.0000 mg | ORAL_TABLET | Freq: Every day | ORAL | 3 refills | Status: DC

## 2019-03-24 NOTE — Telephone Encounter (Signed)
Patient had an echo done on 6/26 and is still awaiting the results.   Please advise

## 2019-03-24 NOTE — Telephone Encounter (Signed)
Spoke with pt and explained echo and medication recommendations. She wanted clarification that the echo images were a clear reading. I confirmed it was.   We reviewed the use of Losartan and strategies on how to take it to avoid hypotension. I advised her she could take it in the evening and monitor her BP, especially if symptomatic. I encouraged her to monitor her BP once daily at the same time if she was concerned with hypotension. I reviewed her past BP readings and assured her they all looked WNL and safe to add Losartan.  I also placed BMP orders which [facility] week of July 20th to have drawn.  She has verbalized understanding and had no additional questions.

## 2019-03-24 NOTE — Telephone Encounter (Signed)
Patient notified of results of echo and verbalized understanding. After a long discussion about what other doctors - Dr Ubaldo Glassing and Dr Wadie Lessen - had recommended, patient decided she wanted to think about starting losartan or not. She said one doctor had recommended lisinopril.  She had numerous questions about whether she should continue to exercise and why her EF had decreased from last year. She is not symptomatic with exercise so I encouraged her to continue. As far as the reason why the EF decreased the doctor would need to answer that question. She is due for follow up in August so offered to schedule her appointment. She will call back to schedule and let us know her decision on the losartan.

## 2019-03-24 NOTE — Telephone Encounter (Signed)
-----   Message from Deboraha Sprang, MD sent at 03/23/2019  9:57 PM EDT ----- Please Inform Patient Echo showed stable  heart muscle function   If her BP will allow we should try and begin low dose losartan 25 for long term benefits of maintaining function and decreasing mortality risk

## 2019-03-28 ENCOUNTER — Other Ambulatory Visit: Payer: Self-pay | Admitting: *Deleted

## 2019-03-28 ENCOUNTER — Telehealth: Payer: Self-pay | Admitting: Internal Medicine

## 2019-03-28 DIAGNOSIS — I442 Atrioventricular block, complete: Secondary | ICD-10-CM

## 2019-03-28 MED ORDER — LOSARTAN POTASSIUM 25 MG PO TABS: 25.0000 mg | ORAL_TABLET | Freq: Every day | ORAL | 1 refills | Status: DC

## 2019-03-28 NOTE — Telephone Encounter (Signed)
°*  STAT* If patient is at the pharmacy, call can be transferred to refill team.   1. Which medications need to be refilled? (please list name of each medication and dose if known)  Losartan 25 mg po q d   2. Which pharmacy/location (including street and city if local pharmacy) is medication to be sent to?   [facility] [place] [address]   3. Do they need a 30 day or 90 day supply? [place]

## 2019-04-04 ENCOUNTER — Telehealth: Payer: Self-pay | Admitting: Internal Medicine

## 2019-04-04 DIAGNOSIS — I442 Atrioventricular block, complete: Secondary | ICD-10-CM

## 2019-04-04 NOTE — Telephone Encounter (Signed)
Pt c/o medication issue:  1. Name of Medication: losartan   2. How are you currently taking this medication (dosage and times per day)? 25 MG 1 tablet daily   3. Are you having a reaction (difficulty breathing--STAT)? No   4. What is your medication issue? States that her pharmacy gets back logged often on this medication.  Patient is concerned since it is a heart medication and would like to know what to do if this were to happen.  Would also like to know if she could switch to another pharmacy that this does not happen at.  Please call to discuss.

## 2019-04-05 MED ORDER — LOSARTAN POTASSIUM 25 MG PO TABS: 25.0000 mg | ORAL_TABLET | Freq: Every day | ORAL | 1 refills | Status: DC

## 2019-04-05 NOTE — Telephone Encounter (Signed)
Pt is returning your call

## 2019-04-05 NOTE — Telephone Encounter (Signed)
No answer/Voicemail box is full.  

## 2019-04-05 NOTE — Telephone Encounter (Signed)
Spoke with patient and reviewed that each pharmacy is different and that she may want to check with [facility]. She was agreeable with this and requested that I send to the one near [employer]. Advised that if she should have any problems to please give me a call back and I would be happy to assist in anyway. She verbalized understanding of our conversation, agreeable with plan, and had no further questions at this time.

## 2019-04-15 ENCOUNTER — Telehealth: Payer: Self-pay | Admitting: Internal Medicine

## 2019-04-15 NOTE — Telephone Encounter (Signed)
Pt c/o medication issue:  1. Name of Medication: losartan   2. How are you currently taking this medication (dosage and times per day)? 25 mg po q d   3. Are you having a reaction (difficulty breathing--STAT)? No   4. What is your medication issue? Patient date of starting losartan changed from previously advised date  Patient started taking 7/15 or 7/16 and wants to know when to have fu bmp   Patient wants to talk to nurse to advise on lab check date as 2 weeks from 7/16

## 2019-04-15 NOTE — Telephone Encounter (Signed)
I spoke with the patient.  I advised her she may come in to [place] on 7/30 or 7/31 for a repeat BMP to be done.   She is aware to check in at the first desk on the right.

## 2019-04-28 ENCOUNTER — Other Ambulatory Visit
Admission: RE | Admit: 2019-04-28 | Discharge: 2019-04-28 | Disposition: A | Discharge status: Home / Self Care | Payer: Medicare HMO | Source: Ambulatory Visit | Attending: Internal Medicine | Admitting: Internal Medicine

## 2019-04-28 DIAGNOSIS — I442 Atrioventricular block, complete: Secondary | ICD-10-CM | POA: Insufficient documentation

## 2019-04-28 LAB — BASIC METABOLIC PANEL
Anion gap: 8 (ref 5–15)
BUN: 18 mg/dL (ref 8–23)
CO2: 26 mmol/L (ref 22–32)
Calcium: 9.3 mg/dL (ref 8.9–10.3)
Chloride: 104 mmol/L (ref 98–111)
Creatinine, Ser: 0.73 mg/dL (ref 0.44–1.00)
GFR calc Af Amer: >60 mL/min (ref >60)
GFR calc non Af Amer: >60 mL/min (ref >60)
Glucose, Bld: 83 mg/dL (ref 70–99)
Potassium: 4.3 mmol/L (ref 3.5–5.1)
Sodium: 138 mmol/L (ref 135–145)

## 2019-05-10 ENCOUNTER — Ambulatory Visit (INDEPENDENT_AMBULATORY_CARE_PROVIDER_SITE_OTHER): Payer: Medicare HMO | Admitting: *Deleted

## 2019-05-10 DIAGNOSIS — I442 Atrioventricular block, complete: Secondary | ICD-10-CM | POA: Diagnosis not present

## 2019-05-11 LAB — CUP PACEART REMOTE DEVICE CHECK
Battery Remaining Longevity: 157 mo
Battery Voltage: 3.04 V
Brady Statistic AP VP Percent: 0.47 %
Brady Statistic AP VS Percent: 1.68 %
Brady Statistic AS VP Percent: 0.04 %
Brady Statistic AS VS Percent: 97.81 %
Brady Statistic RA Percent Paced: 2.01 %
Brady Statistic RV Percent Paced: 0.52 %
Date Time Interrogation Session: 20200818052717
Implantable Lead Implant Date: 20190805
Implantable Lead Implant Date: 20190805
Implantable Lead Implant Date: 20190805
Implantable Lead Location: 753858
Implantable Lead Location: 753859
Implantable Lead Location: 753860
Implantable Lead Model: 4398
Implantable Lead Model: 5076
Implantable Lead Model: 5076
Implantable Lead Serial Number: 11111
Implantable Pulse Generator Implant Date: 20190805
Lead Channel Impedance Value: 304 Ohm
Lead Channel Impedance Value: 342 Ohm
Lead Channel Impedance Value: 380 Ohm
Lead Channel Impedance Value: 399 Ohm
Lead Channel Impedance Value: 437 Ohm
Lead Channel Impedance Value: 437 Ohm
Lead Channel Impedance Value: 475 Ohm
Lead Channel Impedance Value: 475 Ohm
Lead Channel Impedance Value: 475 Ohm
Lead Channel Impedance Value: 665 Ohm
Lead Channel Impedance Value: 665 Ohm
Lead Channel Impedance Value: 703 Ohm
Lead Channel Impedance Value: 779 Ohm
Lead Channel Impedance Value: 798 Ohm
Lead Channel Pacing Threshold Amplitude: 0.5 V
Lead Channel Pacing Threshold Amplitude: 0.75 V
Lead Channel Pacing Threshold Amplitude: 1.125 V
Lead Channel Pacing Threshold Pulse Width: 0.4 ms
Lead Channel Pacing Threshold Pulse Width: 0.4 ms
Lead Channel Pacing Threshold Pulse Width: 0.4 ms
Lead Channel Sensing Intrinsic Amplitude: 12 mV
Lead Channel Sensing Intrinsic Amplitude: 12 mV
Lead Channel Sensing Intrinsic Amplitude: 2.375 mV
Lead Channel Sensing Intrinsic Amplitude: 2.375 mV
Lead Channel Setting Pacing Amplitude: 1.5 V
Lead Channel Setting Pacing Amplitude: 2 V
Lead Channel Setting Pacing Pulse Width: 0.4 ms
Lead Channel Setting Sensing Sensitivity: 2 mV

## 2019-05-17 ENCOUNTER — Ambulatory Visit (INDEPENDENT_AMBULATORY_CARE_PROVIDER_SITE_OTHER): Payer: Medicare HMO | Admitting: Internal Medicine

## 2019-05-17 ENCOUNTER — Other Ambulatory Visit: Payer: Self-pay

## 2019-05-17 VITALS — BP 138/72 | HR 83 | Ht 63.0 in | Wt 132.0 lb

## 2019-05-17 DIAGNOSIS — R000 Tachycardia, unspecified: Secondary | ICD-10-CM | POA: Diagnosis not present

## 2019-05-17 DIAGNOSIS — I442 Atrioventricular block, complete: Secondary | ICD-10-CM

## 2019-05-17 DIAGNOSIS — Z950 Presence of cardiac pacemaker: Secondary | ICD-10-CM

## 2019-05-17 DIAGNOSIS — Z79899 Other long term (current) drug therapy: Secondary | ICD-10-CM

## 2019-05-17 MED ORDER — SPIRONOLACTONE 25 MG PO TABS: 12.5000 mg | ORAL_TABLET | Freq: Every day | ORAL | 1 refills | Status: DC

## 2019-05-17 NOTE — Progress Notes (Signed)
Patient Care Team: Rusty Aus, MD as PCP - General (Internal Medicine)   HPI  Doris [name] is a 68 y.o. 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 Has also seen Dr Mare Ferrari -- notes reviewed   Functional status is good without dyspnea except with severe exertion.  Intermittent chest discomfort is related to use of her arms i.e. pushing the lawnmower and/or lifting.  No edema.  No palpitations.  Losartan was started following the reassessment of LV function.  Tolerated it was no cough.  Did have a transient sore throat now abated Date Cr K Hgb  8/20 0.73 4.3 14.4                Past Medical History:  Diagnosis Date  . Cancer (HCC)    Basal cell ( bridge of her nose)   . Headache    Migraines  . Hx of migraine headaches   . Medical history non-contributory    Mitral valve prolapse at 20 yrs of age    Past Surgical History:  Procedure Laterality Date  . CARDIAC CATHETERIZATION    . CATARACT EXTRACTION  02/2017  . COLONOSCOPY WITH PROPOFOL N/A 03/03/2017   Procedure: COLONOSCOPY WITH PROPOFOL;  Surgeon: Garlan Fair, MD;  Location: [facility];  Service: Endoscopy;  Laterality: N/A;  . OOPHORECTOMY  2005    Current Meds  Medication Sig  . b complex vitamins tablet Take 0.5 tablets by mouth daily.  . Calcium-Magnesium 200-100 MG TABS Take 1 tablet by mouth 2 (two) times daily.  . Cholecalciferol (VITAMIN D3) LIQD Take 1 drop by mouth daily.   Javier Docker Oil 350 MG CAPS Take 350 mg by mouth.  . losartan (COZAAR) 25 MG tablet Take 1 tablet (25 mg total) by mouth daily.  Vladimir Faster Glycol-Propyl Glycol (SYSTANE ULTRA OP) Place 1  drop into the right eye every 2 (two) hours as needed (dry eyes).  . vitamin B-12 (CYANOCOBALAMIN) 1000 MCG tablet Take 1,000 mcg by mouth daily.    Allergies  Allergen Reactions  . Benzocaine Other (See Comments)    Peripheral Neuropathy  . Clarithromycin Other (See Comments)    PERIPHERAL NEUROPATHY  . Doxycycline Other (See Comments)    Stomach upset and Neuropathy  . Lidocaine Other (See Comments)    UNABLE TO SPEAK, Dr thinks he hit a blood vessal  . Metronidazole Other (See Comments)    PERIPHERAL NEUROPATHY  . 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 Has patient had a PCN reaction causing immediate rash, facial/tongue/throat swelling, SOB or lightheadedness with hypotension: Unknown Has patient had a PCN reaction causing severe rash involving mucus membranes or skin necrosis: Unknown Has patient had a PCN reaction that required hospitalization: Yes Has patient had a PCN reaction occurring within the last 10 years: No If all of the above answers are "NO", then may proceed with Cephalos      Review of Systems negative except from HPI and PMH  Physical Exam BP 138/72 (BP Location: Left Arm, Patient  Position: Sitting, Cuff Size: Normal)   Pulse 83   Ht 5\' 3"  (1.6 m)   Wt 132 lb (59.9 kg)   SpO2 97%   BMI 23.38 kg/m  Well developed and well nourished in no acute distress HENT normal Neck supple with JVP-flat Clear Device pocket well healed; without hematoma or erythema.  There is no tethering  Regular rate and rhythm, no  Abd-soft with active BS No Clubbing cyanosis   edema Skin-warm and dry A & Oriented  Grossly normal sensory and motor function  ECG sinus @ 97 P wave morphology in the face of low amplitude with a biphasic P wave in V3 and V2 again raising the question with a origin of the atrial activity 14/07/36   Assessment and  Plan  Sinus tachycardia  Mitral valve repair  Complete heart  block-transient  CRT-P-Medtronic  Cardio myopathy-mild  Beta-blocker intolerance   Overall is feeling and doing quite well.  We reviewed limitations of isometric exercise.  Encouraged aerobic exercise.  Is tolerating losartan.  With LV dysfunction, discussed either re-trying beta-blockers or using spironolactone.  She would prefer the latter.  We will begin at 12.5 mg.  Reviewed side effects and the need to monitor potassium.  Her overall sinus rates are modestly improved.  Mean a timely over recent months is about 90.  This is down from 100 about 3 months ago. \   The patient has persistent albeit somewhat less rapid tachycardia.  There is a gradual trend noted over the last couple of months in particular over the last couple of weeks.  The P wave morphology is concerning.  It is upright in lead V1 today suggesting a different focus from her ECGs in 2013 and May 2018 where it was negative.  However, when seen October 19, it was biphasic positive negative, predominantly negative still with the tachycardia.  I wonder if it is just lead placement.  We do have to be concerned about the possibility of an atrial tachycardia  We will plan to review rates in 3 months  We spent more than 50% of our >25 min visit in face to face counseling regarding the above       Current medicines are reviewed at length with the patient today .  The patient does not  have concerns regarding medicines.

## 2019-05-17 NOTE — Patient Instructions (Signed)
Medication Instructions:  - Your physician has recommended you make the following change in your medication:   1) Start aldactone (spironolactone) 25 mg- take 1/2 tablet (12.5 mg) by mouth once daily  If you need a refill on your cardiac medications before your next appointment, please call your pharmacy.   Lab work: - Your physician recommends that you return for lab work in: 2 weeks (around 05/31/19)- [place] entrance, 1st desk on the right to check in - Monday-Friday (7:30 am- 5:30 pm)  If you have labs (blood work) drawn today and your tests are completely normal, you will receive your results only by: Marland Kitchen MyChart Message (if you have MyChart) OR . A paper copy in the mail If you have any lab test that is abnormal or we need to change your treatment, we will call you to review the results.  Testing/Procedures: - none ordered  Follow-Up: At [facility], you and your health needs are our priority.  As part of our continuing mission to provide you with exceptional heart care, we have created designated Provider Care Teams.  These Care Teams include your primary Cardiologist (physician) and Advanced Practice Providers (APPs -  Physician Assistants and Nurse Practitioners) who all work together to provide you with the care you need, when you need it.  . in 3 months with Dr. Caryl Comes   Remote monitoring is used to monitor your Pacemaker of ICD from home. This monitoring reduces the number of office visits required to check your device to one time per year. It allows [place] to keep an eye on the functioning of your device to ensure it is working properly. You are scheduled for a device check from home on 08/09/19. You may send your transmission at any time that day. If you have a wireless device, the transmission will be sent automatically. After your physician reviews your transmission, you will receive a postcard with your next transmission date.   Any Other Special Instructions Will Be  Listed Below (If Applicable). - N/A

## 2019-05-19 ENCOUNTER — Encounter: Payer: Self-pay | Admitting: Cardiology

## 2019-05-19 NOTE — Progress Notes (Signed)
Remote pacemaker transmission.   

## 2019-05-31 ENCOUNTER — Other Ambulatory Visit
Admission: RE | Admit: 2019-05-31 | Discharge: 2019-05-31 | Disposition: A | Discharge status: Home / Self Care | Payer: Medicare HMO | Source: Ambulatory Visit | Attending: Internal Medicine | Admitting: Internal Medicine

## 2019-05-31 DIAGNOSIS — Z79899 Other long term (current) drug therapy: Secondary | ICD-10-CM | POA: Diagnosis present

## 2019-05-31 DIAGNOSIS — R000 Tachycardia, unspecified: Secondary | ICD-10-CM | POA: Insufficient documentation

## 2019-05-31 LAB — BASIC METABOLIC PANEL
Anion gap: 10 (ref 5–15)
BUN: 16 mg/dL (ref 8–23)
CO2: 25 mmol/L (ref 22–32)
Calcium: 9.6 mg/dL (ref 8.9–10.3)
Chloride: 103 mmol/L (ref 98–111)
Creatinine, Ser: 0.76 mg/dL (ref 0.44–1.00)
GFR calc Af Amer: >60 mL/min (ref >60)
GFR calc non Af Amer: >60 mL/min (ref >60)
Glucose, Bld: 124 mg/dL — ABNORMAL HIGH (ref 70–99)
Potassium: 4.1 mmol/L (ref 3.5–5.1)
Sodium: 138 mmol/L (ref 135–145)

## 2019-06-09 NOTE — Telephone Encounter (Signed)
Patient calling in regarding previous mychart message sent to Dr. Caryl Comes. Patient looking for advice on low sodium and low potasium. Also has questions from previous AVS

## 2019-06-16 ENCOUNTER — Other Ambulatory Visit: Payer: Self-pay | Admitting: *Deleted

## 2019-06-16 DIAGNOSIS — I442 Atrioventricular block, complete: Secondary | ICD-10-CM

## 2019-06-16 MED ORDER — LOSARTAN POTASSIUM 25 MG PO TABS: 25.0000 mg | ORAL_TABLET | Freq: Every day | ORAL | 2 refills | Status: DC

## 2019-06-28 ENCOUNTER — Other Ambulatory Visit: Payer: Self-pay | Admitting: Internal Medicine

## 2019-06-28 DIAGNOSIS — I442 Atrioventricular block, complete: Secondary | ICD-10-CM

## 2019-06-28 MED ORDER — LOSARTAN POTASSIUM 25 MG PO TABS: 25.0000 mg | ORAL_TABLET | Freq: Every day | ORAL | 3 refills | Status: DC

## 2019-06-28 NOTE — Telephone Encounter (Signed)
Pt calling requesting her medication Losartan be sent to a different pharmacy. Medication resent to requested pharmacy. Confirmation received.

## 2019-07-04 ENCOUNTER — Telehealth: Payer: Self-pay | Admitting: Internal Medicine

## 2019-07-04 NOTE — Telephone Encounter (Signed)
Called pt to inform her that her medication is already at her pharmacy, sent 06/28/19 with a year supply. I called pt's pharmacy and they had the medication on hold and will be getting it ready for pt to pick up. I informed pt of this and advised pt that if she has any other problems, questions or concerns to call the office. Pt verbalized understanding.

## 2019-07-04 NOTE — Telephone Encounter (Signed)
° °*  STAT* If patient is at the pharmacy, call can be transferred to refill team.   1. Which medications need to be refilled? (please list name of each medication and dose if known)  losartan (COZAAR) 25 MG tablet  2. Which pharmacy/location (including street and city if local pharmacy) is medication to be sent to? [place], [place]  3. Do they need a 30 day or 90 day supply? 90  Patient states the pharmacy has not gotten any new rx from our office, and she requested the meds last Monday. The rx in the chart states that it was sent to the pharmacy 10/6.  Patient is close to running out of medication

## 2019-08-09 ENCOUNTER — Ambulatory Visit (INDEPENDENT_AMBULATORY_CARE_PROVIDER_SITE_OTHER): Payer: Medicare HMO | Admitting: *Deleted

## 2019-08-09 DIAGNOSIS — I442 Atrioventricular block, complete: Secondary | ICD-10-CM | POA: Diagnosis not present

## 2019-08-10 LAB — CUP PACEART REMOTE DEVICE CHECK
Battery Remaining Longevity: 155 mo
Battery Voltage: 3.03 V
Brady Statistic AP VP Percent: 0.61 %
Brady Statistic AP VS Percent: 3.1 %
Brady Statistic AS VP Percent: 0.05 %
Brady Statistic AS VS Percent: 96.24 %
Brady Statistic RA Percent Paced: 3.58 %
Brady Statistic RV Percent Paced: 0.66 %
Date Time Interrogation Session: 20201117052043
Implantable Lead Implant Date: 20190805
Implantable Lead Implant Date: 20190805
Implantable Lead Implant Date: 20190805
Implantable Lead Location: 753858
Implantable Lead Location: 753859
Implantable Lead Location: 753860
Implantable Lead Model: 4398
Implantable Lead Model: 5076
Implantable Lead Model: 5076
Implantable Lead Serial Number: 11111
Implantable Pulse Generator Implant Date: 20190805
Lead Channel Impedance Value: 304 Ohm
Lead Channel Impedance Value: 361 Ohm
Lead Channel Impedance Value: 361 Ohm
Lead Channel Impedance Value: 399 Ohm
Lead Channel Impedance Value: 437 Ohm
Lead Channel Impedance Value: 456 Ohm
Lead Channel Impedance Value: 456 Ohm
Lead Channel Impedance Value: 475 Ohm
Lead Channel Impedance Value: 475 Ohm
Lead Channel Impedance Value: 646 Ohm
Lead Channel Impedance Value: 684 Ohm
Lead Channel Impedance Value: 722 Ohm
Lead Channel Impedance Value: 779 Ohm
Lead Channel Impedance Value: 779 Ohm
Lead Channel Pacing Threshold Amplitude: 0.5 V
Lead Channel Pacing Threshold Amplitude: 0.625 V
Lead Channel Pacing Threshold Amplitude: 1.125 V
Lead Channel Pacing Threshold Pulse Width: 0.4 ms
Lead Channel Pacing Threshold Pulse Width: 0.4 ms
Lead Channel Pacing Threshold Pulse Width: 0.4 ms
Lead Channel Sensing Intrinsic Amplitude: 1.875 mV
Lead Channel Sensing Intrinsic Amplitude: 1.875 mV
Lead Channel Sensing Intrinsic Amplitude: 12.375 mV
Lead Channel Sensing Intrinsic Amplitude: 12.375 mV
Lead Channel Setting Pacing Amplitude: 1.5 V
Lead Channel Setting Pacing Amplitude: 2 V
Lead Channel Setting Pacing Pulse Width: 0.4 ms
Lead Channel Setting Sensing Sensitivity: 2 mV

## 2019-08-15 LAB — CUP PACEART INCLINIC DEVICE CHECK
Battery Remaining Longevity: 157 mo
Battery Voltage: 3.04 V
Brady Statistic AP VP Percent: 0.49 %
Brady Statistic AP VS Percent: 1.54 %
Brady Statistic AS VP Percent: 0.05 %
Brady Statistic AS VS Percent: 97.91 %
Brady Statistic RA Percent Paced: 1.88 %
Brady Statistic RV Percent Paced: 0.54 %
Date Time Interrogation Session: 20200825101400
Implantable Lead Implant Date: 20190805
Implantable Lead Implant Date: 20190805
Implantable Lead Implant Date: 20190805
Implantable Lead Location: 753858
Implantable Lead Location: 753859
Implantable Lead Location: 753860
Implantable Lead Model: 4398
Implantable Lead Model: 5076
Implantable Lead Model: 5076
Implantable Lead Serial Number: 11111
Implantable Pulse Generator Implant Date: 20190805
Lead Channel Impedance Value: 304 Ohm
Lead Channel Impedance Value: 380 Ohm
Lead Channel Impedance Value: 418 Ohm
Lead Channel Impedance Value: 456 Ohm
Lead Channel Impedance Value: 494 Ohm
Lead Channel Impedance Value: 646 Ohm
Lead Channel Impedance Value: 665 Ohm
Lead Channel Impedance Value: 684 Ohm
Lead Channel Impedance Value: 722 Ohm
Lead Channel Impedance Value: 760 Ohm
Lead Channel Pacing Threshold Amplitude: 0.5 V
Lead Channel Pacing Threshold Amplitude: 0.75 V
Lead Channel Pacing Threshold Amplitude: 1.125 V
Lead Channel Pacing Threshold Pulse Width: 0.4 ms
Lead Channel Pacing Threshold Pulse Width: 0.4 ms
Lead Channel Pacing Threshold Pulse Width: 0.4 ms
Lead Channel Sensing Intrinsic Amplitude: 18 mV
Lead Channel Sensing Intrinsic Amplitude: 2 mV
Lead Channel Setting Pacing Amplitude: 1.5 V
Lead Channel Setting Pacing Amplitude: 2 V
Lead Channel Setting Pacing Pulse Width: 0.4 ms
Lead Channel Setting Sensing Sensitivity: 2 mV

## 2019-08-17 IMAGING — CR DG CHEST 2V
1 series · 2 of 2 positions shown · non-contrast
Comparison: Portable chest x-ray October 05, 2011

CLINICAL DATA: Patient reports left-sided chest pain radiating to
the back associated with nausea, lightheadedness, and sense of
impending doom for an hour yesterday. History of mitral valve
prolapse. No vomiting or shortness of breath.

EXAM:
CHEST - 2 VIEW

[Series 1: dg chest 2 view · 0.14mm/px · 2 of 2 slices shown]
[im 1/2]
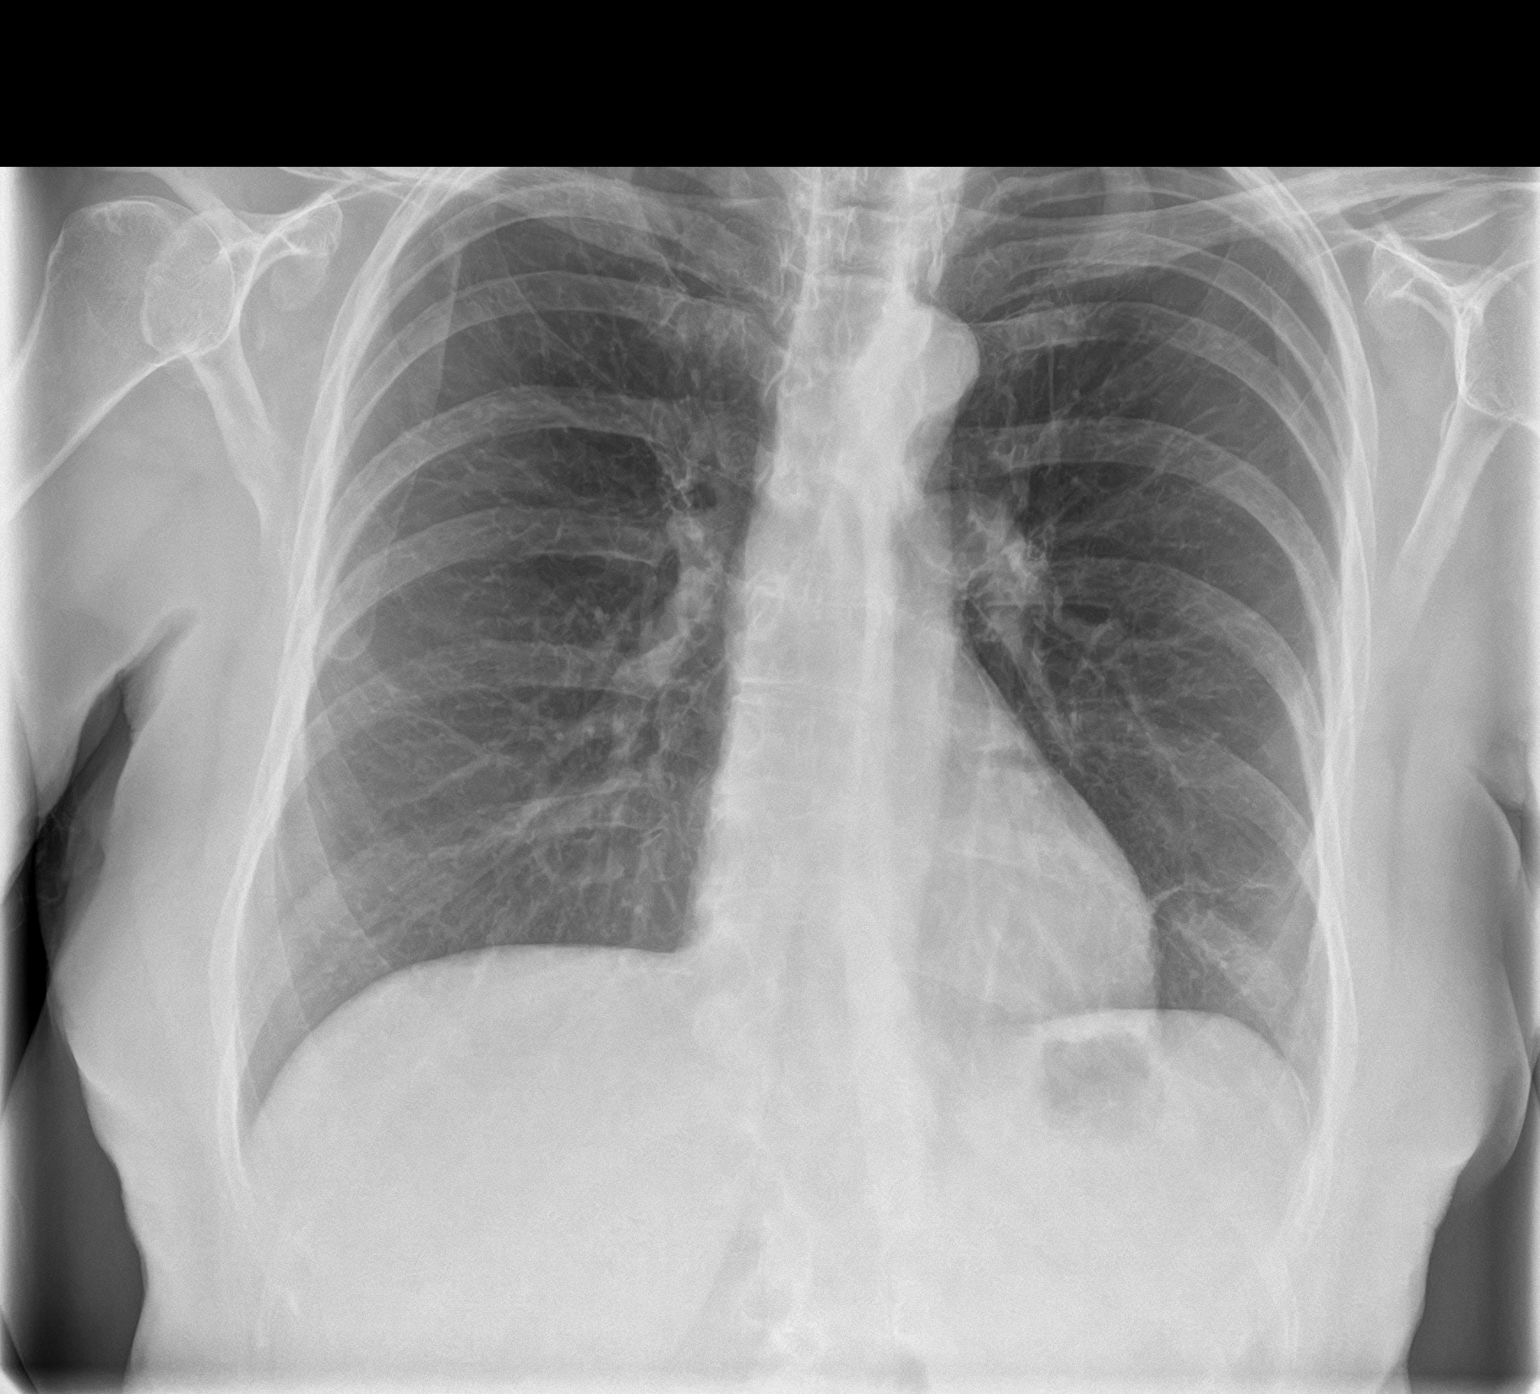
[im 2/2]
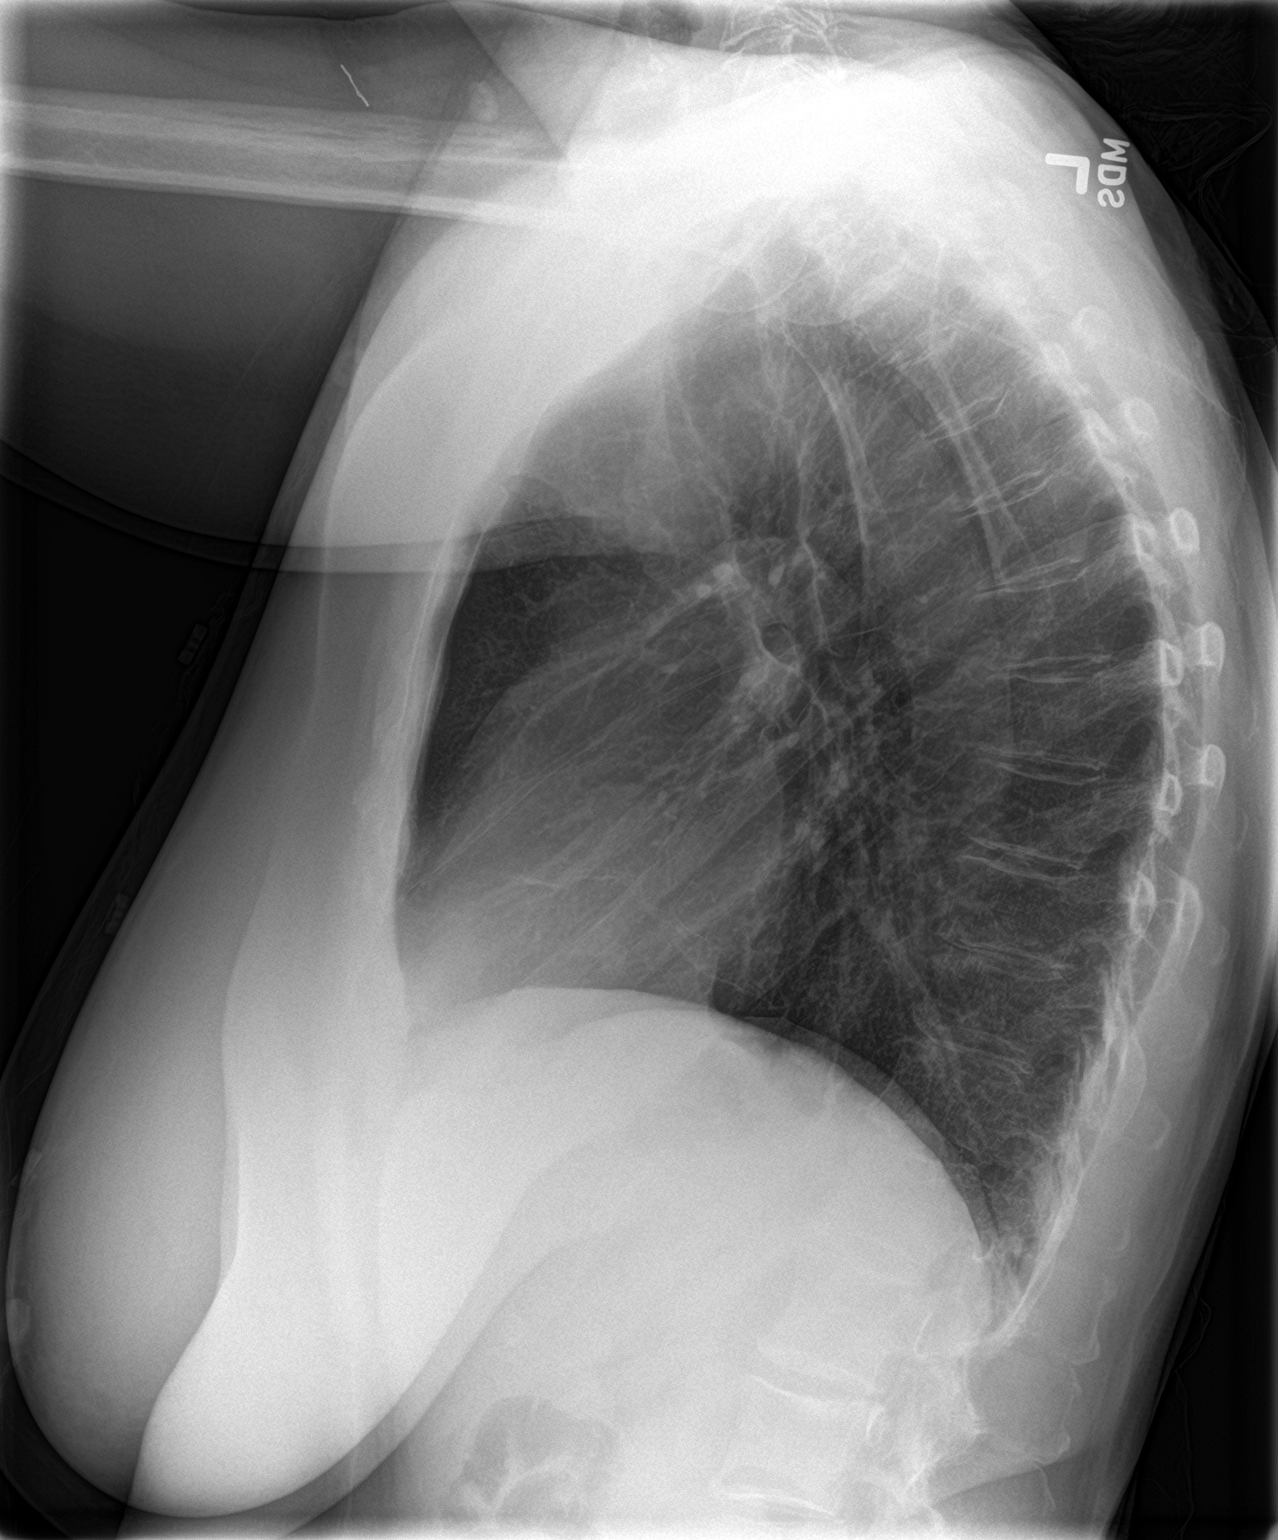

[2 of 2 positions shown; findings below may reference images not displayed]

FINDINGS: The lungs are adequately inflated. There is linear increased density
lateral to the cardiac apex which is new since the previous study.
There is no alveolar infiltrate or pleural effusion. There is no
pneumothorax, pneumomediastinum, or pleural effusion. The heart and
pulmonary vascularity are normal. The mediastinum is normal in
width. The bony thorax exhibits no acute abnormality.
IMPRESSION: There is no pneumonia nor other acute cardiopulmonary abnormality.

## 2019-08-23 ENCOUNTER — Other Ambulatory Visit: Payer: Self-pay

## 2019-08-23 ENCOUNTER — Encounter: Payer: Self-pay | Admitting: Internal Medicine

## 2019-08-23 ENCOUNTER — Ambulatory Visit (INDEPENDENT_AMBULATORY_CARE_PROVIDER_SITE_OTHER): Payer: Medicare HMO | Admitting: Internal Medicine

## 2019-08-23 VITALS — BP 100/68 | HR 99 | Temp 97.0°F | Ht 62.0 in | Wt 125.8 lb

## 2019-08-23 DIAGNOSIS — R000 Tachycardia, unspecified: Secondary | ICD-10-CM | POA: Diagnosis not present

## 2019-08-23 DIAGNOSIS — I059 Rheumatic mitral valve disease, unspecified: Secondary | ICD-10-CM

## 2019-08-23 DIAGNOSIS — Z950 Presence of cardiac pacemaker: Secondary | ICD-10-CM | POA: Diagnosis not present

## 2019-08-23 DIAGNOSIS — I442 Atrioventricular block, complete: Secondary | ICD-10-CM | POA: Diagnosis not present

## 2019-08-23 MED ORDER — BISOPROLOL FUMARATE 5 MG PO TABS: 2.5000 mg | ORAL_TABLET | Freq: Every day | ORAL | 0 refills | Status: DC

## 2019-08-23 MED ORDER — METOPROLOL SUCCINATE ER 25 MG PO TB24: 25.0000 mg | ORAL_TABLET | Freq: Every day | ORAL | 0 refills | Status: DC

## 2019-08-23 NOTE — Patient Instructions (Addendum)
Medication Instructions:  - Your physician has recommended you make the following change in your medication:   1) Stop losartan  2) (in 3 weeks) You have also been given a prescription for 2 different beta blockers to try. You may take them in any order, but DO NOT take more than 1 at a time.   - Bisoprolol 5 mg- take 1/2 tablet (2.5 mg) by mouth once daily - Metoprolol succinate 25 mg- take 1 tablet (25 mg) by mouth once daily  *If you need a refill on your cardiac medications before your next appointment, please call your pharmacy*  Lab Work: - none ordered  If you have labs (blood work) drawn today and your tests are completely normal, you will receive your results only by: Marland Kitchen MyChart Message (if you have MyChart) OR . A paper copy in the mail If you have any lab test that is abnormal or we need to change your treatment, we will call you to review the results.  Testing/Procedures: - none ordered  Follow-Up: At [facility], you and your health needs are our priority.  As part of our continuing mission to provide you with exceptional heart care, we have created designated Provider Care Teams.  These Care Teams include your primary Cardiologist (physician) and Advanced Practice Providers (APPs -  Physician Assistants and Nurse Practitioners) who all work together to provide you with the care you need, when you need it.  Your next appointment:   10 week(s)  The format for your next appointment:   Virtual Visit   Provider:   Virl Axe, MD  Other Instructions - n/a

## 2019-08-23 NOTE — Progress Notes (Signed)
Patient Care Team: Rusty Aus, MD as PCP - General (Internal Medicine)   HPI  Doris [name] is a 68 y.o. 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 Has also seen Dr Mare Ferrari -- notes reviewed  She has been tried on losartan. Initially she tolerated it.; However, she comes in today with a 10 list item of attributable side effects.  She has seen Dr. Ubaldo Glassing in the interim and is willing to take a beta-blocker after discussions with him  Date Cr K Hgb  8/20 0.73 4.3 14.4                Past Medical History:  Diagnosis Date  . Cancer (HCC)    Basal cell ( bridge of her nose)   . Headache    Migraines  . Hx of migraine headaches   . Medical history non-contributory    Mitral valve prolapse at 11 yrs of age    Past Surgical History:  Procedure Laterality Date  . CARDIAC CATHETERIZATION    . CATARACT EXTRACTION  02/2017  . COLONOSCOPY WITH PROPOFOL N/A 03/03/2017   Procedure: COLONOSCOPY WITH PROPOFOL;  Surgeon: Garlan Fair, MD;  Location: [facility];  Service: Endoscopy;  Laterality: N/A;  . OOPHORECTOMY  2005    Current Meds  Medication Sig  . b complex vitamins tablet Take 0.5 tablets by mouth daily.  . Calcium-Magnesium 200-100 MG TABS Take 1 tablet by mouth 2 (two) times daily.  . Cholecalciferol (VITAMIN D3) LIQD Take 1 drop by mouth daily.   Javier Docker Oil 350 MG CAPS Take 350 mg by mouth.  . losartan (COZAAR) 25 MG tablet Take 1 tablet (25 mg total) by mouth daily.  Vladimir Faster Glycol-Propyl Glycol (SYSTANE ULTRA OP) Place 1 drop into the right eye every 2 (two) hours as needed (dry eyes).  Marland Kitchen spironolactone (ALDACTONE)  25 MG tablet Take 0.5 tablets (12.5 mg total) by mouth daily.  . vitamin B-12 (CYANOCOBALAMIN) 1000 MCG tablet Take 1,000 mcg by mouth daily.    Allergies  Allergen Reactions  . Benzocaine Other (See Comments)    Peripheral Neuropathy  . Carboxymethylcellulose Swelling  . Clarithromycin Other (See Comments)    PERIPHERAL NEUROPATHY  . Doxycycline Other (See Comments)    Stomach upset and Neuropathy  . Fentanyl Other (See Comments)  . Lidocaine Other (See Comments)    UNABLE TO SPEAK, Dr thinks he hit a blood vessal  . Metronidazole Other (See Comments)    PERIPHERAL NEUROPATHY  . Monosodium Glutamate   . 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 Has patient had a PCN reaction causing immediate rash, facial/tongue/throat swelling, SOB or lightheadedness with hypotension: Unknown Has patient had a PCN reaction causing severe rash involving mucus membranes or skin necrosis: Unknown Has patient had a PCN reaction that required hospitalization: Yes Has patient had a PCN reaction occurring within the last 10 years: No If all of the above answers are "NO", then may proceed with Cephalos      Review of  Systems negative except from HPI and PMH  Physical Exam BP 100/68 (BP Location: Left Arm, Patient Position: Sitting, Cuff Size: Normal)   Pulse 99   Temp (!) 97 F (36.1 C)   Ht 5\' 2"  (1.575 m)   Wt 125 lb 12 oz (57 kg)   BMI 23.00 kg/m  Well developed and well nourished in no acute distress HENT normal Neck supple with JVP-flat Clear Device pocket well healed; without hematoma or erythema.  There is no tethering  Regular rate and rhythm, no  murmur Abd-soft with active BS No Clubbing cyanosis   edema Skin-warm and dry A & Oriented  Grossly normal sensory and motor function  ECG sinus rhythm at 99 Intervals 15/07/35   Assessment and  Plan  Sinus tachycardia  Can not exclude atrial  Mitral valve repair  Complete  heart block-transient  Junctional tachycardia  CRT-P-Medtronic  Cardio myopathy-mild  Beta-blocker intolerance  Doing pretty well. She has not tolerated the losartan; we will exclude it for 3 weeks to make sure that her symptoms are indeed related to it. Thereafter, she is willing to try beta-blockers, they are not the same as they were in the 70s and 80s.  Your prescriptions bisoprolol 2.5, metoprolol succinate 25. To start at half dose at bedtime.  She has a newly identified tachycardia which I am presuming to be junctional. It has a VA time of about 100 ms, and is with an A and starts with ventricular signal  We spent more than 50% of our >25 min visit in face to face counseling regarding the above   We will review by telehealth in 10 weeks.       Current medicines are reviewed at length with the patient today .  The patient does not  have concerns regarding medicines.

## 2019-09-06 NOTE — Progress Notes (Signed)
Remote pacemaker transmission.   

## 2019-09-08 LAB — CUP PACEART INCLINIC DEVICE CHECK
Battery Remaining Longevity: 155 mo
Battery Voltage: 3.03 V
Brady Statistic AP VP Percent: 0.64 %
Brady Statistic AP VS Percent: 3.21 %
Brady Statistic AS VP Percent: 0.05 %
Brady Statistic AS VS Percent: 96.1 %
Brady Statistic RA Percent Paced: 3.69 %
Brady Statistic RV Percent Paced: 0.69 %
Date Time Interrogation Session: 20201201085800
Implantable Lead Implant Date: 20190805
Implantable Lead Implant Date: 20190805
Implantable Lead Implant Date: 20190805
Implantable Lead Location: 753858
Implantable Lead Location: 753859
Implantable Lead Location: 753860
Implantable Lead Model: 4398
Implantable Lead Model: 5076
Implantable Lead Model: 5076
Implantable Lead Serial Number: 11111
Implantable Pulse Generator Implant Date: 20190805
Lead Channel Impedance Value: 304 Ohm
Lead Channel Impedance Value: 380 Ohm
Lead Channel Impedance Value: 437 Ohm
Lead Channel Impedance Value: 475 Ohm
Lead Channel Impedance Value: 513 Ohm
Lead Channel Impedance Value: 703 Ohm
Lead Channel Impedance Value: 703 Ohm
Lead Channel Impedance Value: 760 Ohm
Lead Channel Impedance Value: 836 Ohm
Lead Channel Impedance Value: 855 Ohm
Lead Channel Pacing Threshold Amplitude: 0.5 V
Lead Channel Pacing Threshold Amplitude: 0.5 V
Lead Channel Pacing Threshold Amplitude: 1.125 V
Lead Channel Pacing Threshold Pulse Width: 0.4 ms
Lead Channel Pacing Threshold Pulse Width: 0.4 ms
Lead Channel Pacing Threshold Pulse Width: 0.4 ms
Lead Channel Sensing Intrinsic Amplitude: 17.75 mV
Lead Channel Sensing Intrinsic Amplitude: 2.375 mV
Lead Channel Setting Pacing Amplitude: 1.5 V
Lead Channel Setting Pacing Amplitude: 2 V
Lead Channel Setting Pacing Pulse Width: 0.4 ms
Lead Channel Setting Sensing Sensitivity: 2 mV

## 2019-09-26 DIAGNOSIS — I420 Dilated cardiomyopathy: Secondary | ICD-10-CM | POA: Insufficient documentation

## 2019-09-28 ENCOUNTER — Other Ambulatory Visit: Payer: Self-pay

## 2019-09-28 MED ORDER — BISOPROLOL FUMARATE 5 MG PO TABS: 2.5000 mg | ORAL_TABLET | Freq: Every day | ORAL | 1 refills | Status: DC

## 2019-09-29 ENCOUNTER — Telehealth: Payer: Self-pay

## 2019-09-29 NOTE — Telephone Encounter (Signed)
Spoke with patient to determine which beta blocker she feels worked best for her because pharmacy was requesting refills of both bisoprolol and metoprolol. Patient prefers bisoprolol which has been sent to her pharmacy as requested. Metoprolol D/C'edin med list.

## 2019-10-12 ENCOUNTER — Ambulatory Visit: Payer: Medicare Other | Attending: Internal Medicine

## 2019-10-12 DIAGNOSIS — Z23 Encounter for immunization: Secondary | ICD-10-CM | POA: Insufficient documentation

## 2019-10-12 NOTE — Progress Notes (Signed)
   Covid-19 Vaccination Clinic  Name:  Doris [name]    MRN: [id number] DOB: 1951-06-27  10/12/2019  [name] was observed post Covid-19 immunization for 15 minutes without incidence. She was provided with Vaccine Information Sheet and instruction to access the V-Safe system.   [name] was instructed to call 911 with any severe reactions post vaccine: Marland Kitchen Difficulty breathing  . Swelling of your face and throat  . A fast heartbeat  . A bad rash all over your body  . Dizziness and weakness    Immunizations Administered    Name Date Dose VIS Date Route   Pfizer COVID-19 Vaccine 10/12/2019  9:50 AM 0.3 mL 09/02/2019 Intramuscular   Manufacturer: [employer]   Lot: [id number]   [place]: [id number]

## 2019-10-25 ENCOUNTER — Telehealth (INDEPENDENT_AMBULATORY_CARE_PROVIDER_SITE_OTHER): Payer: Medicare HMO | Admitting: Internal Medicine

## 2019-10-25 ENCOUNTER — Other Ambulatory Visit: Payer: Self-pay

## 2019-10-25 ENCOUNTER — Telehealth: Payer: Self-pay | Admitting: Internal Medicine

## 2019-10-25 ENCOUNTER — Encounter: Payer: Self-pay | Admitting: Internal Medicine

## 2019-10-25 VITALS — BP 134/84 | HR 69 | Ht 62.0 in | Wt 126.1 lb

## 2019-10-25 DIAGNOSIS — I442 Atrioventricular block, complete: Secondary | ICD-10-CM | POA: Diagnosis not present

## 2019-10-25 DIAGNOSIS — R000 Tachycardia, unspecified: Secondary | ICD-10-CM

## 2019-10-25 DIAGNOSIS — I429 Cardiomyopathy, unspecified: Secondary | ICD-10-CM | POA: Diagnosis not present

## 2019-10-25 NOTE — Patient Instructions (Signed)
Medication Instructions:  - Your physician recommends that you continue on your current medications as directed. Please refer to the Current Medication list given to you today.  *If you need a refill on your cardiac medications before your next appointment, please call your pharmacy*  Lab Work: - none ordered  If you have labs (blood work) drawn today and your tests are completely normal, you will receive your results only by: Marland Kitchen MyChart Message (if you have MyChart) OR . A paper copy in the mail If you have any lab test that is abnormal or we need to change your treatment, we will call you to review the results.  Testing/Procedures: - none ordered  Follow-Up: At [facility], you and your health needs are our priority.  As part of our continuing mission to provide you with exceptional heart care, we have created designated Provider Care Teams.  These Care Teams include your primary Cardiologist (physician) and Advanced Practice Providers (APPs -  Physician Assistants and Nurse Practitioners) who all work together to provide you with the care you need, when you need it.  Your next appointment:   6 month(s)  The format for your next appointment:   In Person  Provider:   Virl Axe, MD  Other Instructions - talk with Dr. [name] about possibly seeing a GI doctor.

## 2019-10-25 NOTE — Progress Notes (Signed)
Electrophysiology TeleHealth Note   Due to national recommendations of social distancing due to COVID 19, an audio/video telehealth visit is felt to be most appropriate for this patient at this time.  See MyChart message from today for the patient's consent to telehealth for [facility].   Date:  10/25/2019   ID:  Doris [name], DOB 05-08-1951, MRN [id number]  Location: patient's home  Provider location: [address], [place]  Evaluation Performed: Follow-up visit  PCP:  Rusty Aus, MD  Cardiologist:    Electrophysiologist:  SK   Chief Complaint:    History of Present Illness:    Doris [name] is a 69 y.o. female who presents via audio/video conferencing for a telehealth visit today.  Since last being seen in our clinic for  pacemaker Medtronic CRT-P implanted [facility] 8/19 for complete heart block following MV repair for prolapse and regurgitation; 10/19 return of intrinsic conduction, post operative sinus/tachcardia and recently exposed to BB in lieu of losartan being stopped 2/2 side effect concerns , the patient reports doing much better  HR much better on bisoprolol-- BP well controlled  Intercurrent diagnosis--of osteoprosis  The patient denies symptoms of fevers, chills, cough, or new SOB worrisome for COVID 19.    Past Medical History:  Diagnosis Date  . Cancer (HCC)    Basal cell ( bridge of her nose)   . Headache    Migraines  . Hx of migraine headaches   . Medical history non-contributory    Mitral valve prolapse at 63 yrs of age    Past Surgical History:  Procedure Laterality Date  . CARDIAC CATHETERIZATION    . CATARACT EXTRACTION  02/2017  . COLONOSCOPY WITH PROPOFOL N/A 03/03/2017   Procedure: COLONOSCOPY WITH PROPOFOL;  Surgeon: Garlan Fair, MD;  Location: [facility];  Service: Endoscopy;  Laterality: N/A;  . OOPHORECTOMY  2005    Current Outpatient Medications  Medication Sig Dispense Refill  . b complex vitamins  tablet Take 0.5 tablets by mouth daily.    . bisoprolol (ZEBETA) 5 MG tablet Take 0.5 tablets (2.5 mg total) by mouth daily. 15 tablet 1  . Calcium-Magnesium 200-100 MG TABS Take 1 tablet by mouth 2 (two) times daily.    . Cholecalciferol (VITAMIN D3) LIQD Take 1 drop by mouth daily.     Javier Docker Oil 350 MG CAPS Take 350 mg by mouth.    . Multiple Vitamins-Minerals (ICAPS) CAPS Take by mouth daily.     . mupirocin ointment (BACTROBAN) 2 % mupirocin 2 % topical ointment    . Polyethyl Glycol-Propyl Glycol (SYSTANE ULTRA OP) Place 1 drop into the right eye every 2 (two) hours as needed (dry eyes).    . tacrolimus (PROTOPIC) 0.1 % ointment Apply  a small amount to affected area twice a day    . vitamin B-12 (CYANOCOBALAMIN) 1000 MCG tablet Take 1,000 mcg by mouth daily.     No current facility-administered medications for this visit.    Allergies:   Benzocaine, Carboxymethylcellulose, Clarithromycin, Doxycycline, Fentanyl, Lidocaine, Metronidazole, Monosodium glutamate, and Penicillins   Social History:  The patient  reports that she has quit smoking. Her smoking use included cigarettes. She has never used smokeless tobacco. She reports current alcohol use. She reports that she does not use drugs.   Family History:  The patient's   family history includes Cancer in her father and mother; Diabetes in her mother.   ROS:  Please see the history  of present illness.   All other systems are personally reviewed and negative.    Exam:    Vital Signs:  BP 134/84   Pulse 69   Ht 5\' 2"  (1.575 m)   Wt 126 lb 2 oz (57.2 kg)   BMI 23.07 kg/m     Well appearing, alert and conversant, regular work of breathing,  good skin color Eyes- anicteric, neuro- grossly intact, skin- no apparent rash or lesions or cyanosis, mouth- oral mucosa is pink   Labs/Other Tests and Data Reviewed:    Recent Labs: 05/31/2019: BUN 16; Creatinine, Ser 0.76; Potassium 4.1; Sodium 138   Wt Readings from Last 3 Encounters:   10/25/19 126 lb 2 oz (57.2 kg)  08/23/19 125 lb 12 oz (57 kg)  05/17/19 132 lb (59.9 kg)     Other studies personally reviewed: Additional studies/ records that were reviewed today include: VS    ASSESSMENT & PLAN:   Sinus tachycardia  Can not exclude atrial  Mitral valve repair  Complete heart block-transient  Junctional tachycardia  CRT-P-Medtronic  Cardio myopathy-mild  Beta-blocker intolerance  Tolerating bisoprolol  Tolerated vaccine #1    COVID 19 screen The patient denies symptoms of COVID 19 at this time.  The importance of social distancing was discussed today.  Follow-up:  26m Make sure she has had GI eval for food regurg     Current medicines are reviewed at length with the patient today.   The patient does not have concerns regarding her medicines.  The following changes were made today:  none  Labs/ tests ordered today include:   No orders of the defined types were placed in this encounter.   Future tests ( post COVID )   s  Patient Risk:  after full review of this patients clinical status, I feel that they are at moderate  risk at this time.  Today, I have spent 9* minutes with the patient with telehealth technology discussing the above.  Signed, Virl Axe, MD  10/25/2019 6:26 PM     [facility] [address] [place] [address] [phone] (office) [phone] (fax)

## 2019-10-25 NOTE — Telephone Encounter (Signed)
Patient has some questions for Dr. Caryl Comes.  Patient states her hands and feet are cold at times and she wonders if this is a side effect from her medication. States her hands are numb at night . She asks if she still needs to restrict her salt and potassium.

## 2019-10-27 NOTE — Telephone Encounter (Signed)
Late Entry:  I reviewed the questions below with Dr. Caryl Comes on 10/25/19. 1) He was unsure about the causes of coldness in her hands/ feet aside from a possible beta blocker side effect. He questioned if the patient noticed this sense starting her beta blocker.  2) He felt hand numbness could be possible carpal tunnel and advised to try carpel tunnel splints (can be found at the drug store)  3) He felt the patient should stay away from sodium, but potassium was ok  I called and spoke with the patient about Dr. Olin Pia recommendations. She could not relate the coldness in her hands and feet to the initiation of her beta blocker.   The patient voiced understanding of the above recommendations and was agreeable.

## 2019-11-02 ENCOUNTER — Ambulatory Visit: Payer: Medicare HMO | Attending: Internal Medicine

## 2019-11-02 DIAGNOSIS — Z23 Encounter for immunization: Secondary | ICD-10-CM | POA: Insufficient documentation

## 2019-11-02 NOTE — Progress Notes (Signed)
   Covid-19 Vaccination Clinic  Name:  Doris [name]    MRN: [id number] DOB: Sep 13, 1951  11/02/2019  [name] was observed post Covid-19 immunization for 15 minutes without incidence. She was provided with Vaccine Information Sheet and instruction to access the V-Safe system.   [name] was instructed to call 911 with any severe reactions post vaccine: Marland Kitchen Difficulty breathing  . Swelling of your face and throat  . A fast heartbeat  . A bad rash all over your body  . Dizziness and weakness    Immunizations Administered    Name Date Dose VIS Date Route   Pfizer COVID-19 Vaccine 11/02/2019  1:05 PM 0.3 mL 09/02/2019 Intramuscular   Manufacturer: [place]   Lot: [id number]   [place]: [id number]

## 2019-11-08 ENCOUNTER — Ambulatory Visit (INDEPENDENT_AMBULATORY_CARE_PROVIDER_SITE_OTHER): Payer: Medicare HMO | Admitting: *Deleted

## 2019-11-08 DIAGNOSIS — Z950 Presence of cardiac pacemaker: Secondary | ICD-10-CM

## 2019-11-09 LAB — CUP PACEART REMOTE DEVICE CHECK
Battery Remaining Longevity: 153 mo
Battery Voltage: 3.03 V
Brady Statistic AP VP Percent: 0.18 %
Brady Statistic AP VS Percent: 10.36 %
Brady Statistic AS VP Percent: 0.04 %
Brady Statistic AS VS Percent: 89.42 %
Brady Statistic RA Percent Paced: 10.57 %
Brady Statistic RV Percent Paced: 0.21 %
Date Time Interrogation Session: 20210217102917
Implantable Lead Implant Date: 20190805
Implantable Lead Implant Date: 20190805
Implantable Lead Implant Date: 20190805
Implantable Lead Location: 753858
Implantable Lead Location: 753859
Implantable Lead Location: 753860
Implantable Lead Model: 4398
Implantable Lead Model: 5076
Implantable Lead Model: 5076
Implantable Lead Serial Number: 11111
Implantable Pulse Generator Implant Date: 20190805
Lead Channel Impedance Value: 304 Ohm
Lead Channel Impedance Value: 361 Ohm
Lead Channel Impedance Value: 361 Ohm
Lead Channel Impedance Value: 380 Ohm
Lead Channel Impedance Value: 456 Ohm
Lead Channel Impedance Value: 456 Ohm
Lead Channel Impedance Value: 475 Ohm
Lead Channel Impedance Value: 475 Ohm
Lead Channel Impedance Value: 494 Ohm
Lead Channel Impedance Value: 684 Ohm
Lead Channel Impedance Value: 703 Ohm
Lead Channel Impedance Value: 741 Ohm
Lead Channel Impedance Value: 817 Ohm
Lead Channel Impedance Value: 836 Ohm
Lead Channel Pacing Threshold Amplitude: 0.5 V
Lead Channel Pacing Threshold Amplitude: 0.625 V
Lead Channel Pacing Threshold Amplitude: 1.125 V
Lead Channel Pacing Threshold Pulse Width: 0.4 ms
Lead Channel Pacing Threshold Pulse Width: 0.4 ms
Lead Channel Pacing Threshold Pulse Width: 0.4 ms
Lead Channel Sensing Intrinsic Amplitude: 1.625 mV
Lead Channel Sensing Intrinsic Amplitude: 1.625 mV
Lead Channel Sensing Intrinsic Amplitude: 12.375 mV
Lead Channel Sensing Intrinsic Amplitude: 12.375 mV
Lead Channel Setting Pacing Amplitude: 1.5 V
Lead Channel Setting Pacing Amplitude: 2 V
Lead Channel Setting Pacing Pulse Width: 0.4 ms
Lead Channel Setting Sensing Sensitivity: 2 mV

## 2019-12-01 ENCOUNTER — Other Ambulatory Visit: Payer: Self-pay | Admitting: Internal Medicine

## 2020-02-07 ENCOUNTER — Ambulatory Visit (INDEPENDENT_AMBULATORY_CARE_PROVIDER_SITE_OTHER): Payer: Medicare HMO | Admitting: *Deleted

## 2020-02-07 DIAGNOSIS — I429 Cardiomyopathy, unspecified: Secondary | ICD-10-CM

## 2020-02-07 DIAGNOSIS — I442 Atrioventricular block, complete: Secondary | ICD-10-CM

## 2020-02-07 LAB — CUP PACEART REMOTE DEVICE CHECK
Battery Remaining Longevity: 151 mo
Battery Voltage: 3.03 V
Brady Statistic AP VP Percent: 0.15 %
Brady Statistic AP VS Percent: 10.71 %
Brady Statistic AS VP Percent: 0.04 %
Brady Statistic AS VS Percent: 89.09 %
Brady Statistic RA Percent Paced: 10.96 %
Brady Statistic RV Percent Paced: 0.19 %
Date Time Interrogation Session: 20210518012219
Implantable Lead Implant Date: 20190805
Implantable Lead Implant Date: 20190805
Implantable Lead Implant Date: 20190805
Implantable Lead Location: 753858
Implantable Lead Location: 753859
Implantable Lead Location: 753860
Implantable Lead Model: 4398
Implantable Lead Model: 5076
Implantable Lead Model: 5076
Implantable Lead Serial Number: 11111
Implantable Pulse Generator Implant Date: 20190805
Lead Channel Impedance Value: 323 Ohm
Lead Channel Impedance Value: 399 Ohm
Lead Channel Impedance Value: 399 Ohm
Lead Channel Impedance Value: 456 Ohm
Lead Channel Impedance Value: 475 Ohm
Lead Channel Impedance Value: 475 Ohm
Lead Channel Impedance Value: 475 Ohm
Lead Channel Impedance Value: 513 Ohm
Lead Channel Impedance Value: 513 Ohm
Lead Channel Impedance Value: 703 Ohm
Lead Channel Impedance Value: 722 Ohm
Lead Channel Impedance Value: 760 Ohm
Lead Channel Impedance Value: 798 Ohm
Lead Channel Impedance Value: 817 Ohm
Lead Channel Pacing Threshold Amplitude: 0.5 V
Lead Channel Pacing Threshold Amplitude: 0.625 V
Lead Channel Pacing Threshold Amplitude: 1.125 V
Lead Channel Pacing Threshold Pulse Width: 0.4 ms
Lead Channel Pacing Threshold Pulse Width: 0.4 ms
Lead Channel Pacing Threshold Pulse Width: 0.4 ms
Lead Channel Sensing Intrinsic Amplitude: 13.375 mV
Lead Channel Sensing Intrinsic Amplitude: 13.375 mV
Lead Channel Sensing Intrinsic Amplitude: 2.5 mV
Lead Channel Sensing Intrinsic Amplitude: 2.5 mV
Lead Channel Setting Pacing Amplitude: 1.5 V
Lead Channel Setting Pacing Amplitude: 2 V
Lead Channel Setting Pacing Pulse Width: 0.4 ms
Lead Channel Setting Sensing Sensitivity: 2 mV

## 2020-02-09 NOTE — Progress Notes (Signed)
Remote pacemaker transmission.   

## 2020-02-16 ENCOUNTER — Telehealth: Payer: Self-pay | Admitting: Internal Medicine

## 2020-02-16 NOTE — Telephone Encounter (Signed)
   Pt she is asking to speak with Dr. Olin Pia NP. She won't tell what is for. She called during downtime, called pt but no answer and no VM

## 2020-02-17 NOTE — Telephone Encounter (Signed)
Spoke with pt who is complaining of hair loss x several months.  Pt states she read on Google that Bisoprolol can cause hair loss.  Pt states she has been on Bisoprolol for about a year.  She is concerned re: hair loss but states she will continue to take medication at this time.  Pt states she has an appointment with a dermatologist on 02/21/2020 for evaluation of the hair loss as well.  Pt advised will forward information to Dr Caryl Comes for review and recommendation.  Pt verbalizes understanding and agrees with current plan.

## 2020-02-21 NOTE — Telephone Encounter (Signed)
M  all BB cause alopecia  She has not tolerated meds But if she were willing, we could try ivabradine 5mg  bid  Thanks SK

## 2020-02-24 NOTE — Telephone Encounter (Signed)
Spoke with pt and advised of Dr Olin Pia recommendation if she is willing.  Pt states she saw dermatologist but did not like him so she is going to follow up with her PCP.  Pt is not interested in changing medication at this time as she is also being treated for Lyme disease due to a tick bite.  Pt will contact office if she changes her mind re: medication.

## 2020-03-13 ENCOUNTER — Telehealth: Payer: Self-pay | Admitting: Internal Medicine

## 2020-03-13 NOTE — Telephone Encounter (Signed)
I spoke with the patient. She states she typically takes her bisoprolol 2.5 mg once daily at night. She took this ~ 9 pm last night. She was supposed to take her vitamins this morning, but took another 2.5 mg of bisoprolol this morning. She states she just feels a little hot around her head, but her BP/ HR are stable.   She has been outside working in the garden this morning.  I have advised the patient that she has not exceeded the maximum dose of bisoprolol, but just keep an eye on her BP. She is aware to increase her water intake as needed for dizziness/ lightheadedness, especially working out in the heat.   I have advised that when she is due for her normal PM dose of bisoprolol tonight, if her SBP is > 100 & HR > 60 bpm, she can take her regular dose to get back on schedule. 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   She was appreciative for the call back and voiced understanding of the above recommendations.

## 2020-03-13 NOTE — Telephone Encounter (Signed)
Pt c/o medication issue:  1. Name of Medication: bisoprolol   2. How are you currently taking this medication (dosage and times per day)?  2.5 mg po q hs   Patient accidentally took this am instead of vitamin  Last night dose at 9 and this morning at 6    3. Are you having a reaction (difficulty breathing--STAT)?  Head feels hot and hands and feet feel different    4. What is your medication issue? Concerned about side effect of taking med too soon     719 am bp 140/82 hr 69  Patient aware to call back if new or worsening symptoms prior to triage call back

## 2020-05-01 ENCOUNTER — Encounter: Payer: Self-pay | Admitting: Internal Medicine

## 2020-05-01 ENCOUNTER — Other Ambulatory Visit: Payer: Self-pay

## 2020-05-01 ENCOUNTER — Ambulatory Visit (INDEPENDENT_AMBULATORY_CARE_PROVIDER_SITE_OTHER): Payer: Medicare HMO | Admitting: Internal Medicine

## 2020-05-01 VITALS — BP 100/60 | HR 78 | Ht 63.0 in | Wt 129.0 lb

## 2020-05-01 DIAGNOSIS — R000 Tachycardia, unspecified: Secondary | ICD-10-CM

## 2020-05-01 DIAGNOSIS — I429 Cardiomyopathy, unspecified: Secondary | ICD-10-CM | POA: Diagnosis not present

## 2020-05-01 DIAGNOSIS — I442 Atrioventricular block, complete: Secondary | ICD-10-CM

## 2020-05-01 DIAGNOSIS — Z950 Presence of cardiac pacemaker: Secondary | ICD-10-CM

## 2020-05-01 NOTE — Patient Instructions (Signed)

## 2020-05-01 NOTE — Progress Notes (Signed)
Patient Care Team: Rusty Aus, MD as PCP - General (Internal Medicine)   HPI  Doris [name] is a 69 y.o. 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  Switched off because of side effects.   Started on beta-blockers for blood pressure and tachycardia.  The patient denies chest pain, shortness of breath, nocturnal dyspnea, orthopnea or peripheral edema.  There have been no palpitations, lightheadedness or syncope.    DATE TEST EF   6/19 Echo   50 % MR Mod-Severe  9/19 Echo   40 % MR none  6/20 Echo  35-40%   5/21 Echo ([facility]) 40%      Date Cr K Hgb  8/20 0.73 4.3 14.4                Past Medical History:  Diagnosis Date   Cancer ([place])    Basal cell ( bridge of her nose)    Headache    Migraines   Hx of migraine headaches    Medical history non-contributory    Mitral valve prolapse at 19 yrs of age    Past Surgical History:  Procedure Laterality Date   CARDIAC CATHETERIZATION     CATARACT EXTRACTION  02/2017   COLONOSCOPY WITH PROPOFOL N/A 03/03/2017   Procedure: COLONOSCOPY WITH PROPOFOL;  Surgeon: Garlan Fair, MD;  Location: [facility];  Service: Endoscopy;  Laterality: N/A;   OOPHORECTOMY  2005    Current Meds  Medication Sig   b complex vitamins tablet Take 0.5 tablets by mouth daily.   bisoprolol (ZEBETA) 5 MG tablet TAKE 1/2 TABLET BY MOUTH DAILY   Cholecalciferol (VITAMIN D3) LIQD Take 1 drop by mouth daily.    Krill Oil 350 MG CAPS Take 350 mg by mouth.   Multiple Vitamins-Minerals (ICAPS) CAPS Take by mouth daily.    Polyethyl Glycol-Propyl Glycol (SYSTANE ULTRA OP) Place 1 drop into the right eye every 2 (two) hours as needed (dry eyes).   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 old Has patient had a PCN reaction causing immediate rash, facial/tongue/throat swelling, SOB or lightheadedness with hypotension: Unknown Has patient had a PCN reaction causing severe rash involving mucus membranes or skin necrosis: Unknown Has patient had a PCN reaction that required hospitalization: Yes Has patient had a PCN reaction occurring within the last 10 years: No If all of the above answers are "NO", then may proceed with Cephalos      Review of Systems negative except from HPI and PMH  Physical Exam BP 100/60 (BP Location: Left Arm, Patient Position: Sitting, Cuff Size: Normal)    Pulse 78    Ht 5\' 3"  (  1.6 m)    Wt 129 lb (58.5 kg)    SpO2 98%    BMI 22.85 kg/m  Well developed and well nourished in no acute distress HENT normal Neck supple with JVP-flat Clear Device pocket well healed; without hematoma or erythema.  There is no tethering  Regular rate and rhythm, no  murmur Abd-soft with active BS No Clubbing cyanosis  edema Skin-warm and dry A & Oriented  Grossly normal sensory and motor function  ECG sinus at 68 Intervals 15/07/36 Nonspecific ST changes    Assessment and  Plan  Sinus tachycardia  Can not exclude atrial  Mitral valve repair  Complete heart block-transient  Junctional tachycardia  CRT-P-Medtronic  Cardio myopathy-mild  losartan intolerance    Euvolemic continue current meds    However, with ejection fraction still in the 40% range, will ask her to discuss with Dr. Ubaldo Glassing as to whether it would be appropriate to try her on an ACE inhibitor and or spironolactone.  Low blood pressure will require gentle initiation  No significant interval atrial fibrillation  No lightheadedness.       Current medicines are reviewed at length with the patient today .  The patient does not  have concerns regarding medicines.

## 2020-05-08 ENCOUNTER — Telehealth: Payer: Self-pay

## 2020-05-08 ENCOUNTER — Ambulatory Visit (INDEPENDENT_AMBULATORY_CARE_PROVIDER_SITE_OTHER): Payer: Medicare HMO | Admitting: *Deleted

## 2020-05-08 DIAGNOSIS — I442 Atrioventricular block, complete: Secondary | ICD-10-CM | POA: Diagnosis not present

## 2020-05-08 NOTE — Telephone Encounter (Signed)
The pt tried to send a transmission. I called tech support to get additional help. Medtronic is sending the pt a new handheld. She should receive the new handheld in 4-7 business days. I told the pt when she get the new handheld we can do the transmission and I will put her on a new schedule.

## 2020-05-09 LAB — CUP PACEART REMOTE DEVICE CHECK
Battery Remaining Longevity: 148 mo
Battery Voltage: 3.02 V
Brady Statistic AP VP Percent: 0.15 %
Brady Statistic AP VS Percent: 15.05 %
Brady Statistic AS VP Percent: 0.02 %
Brady Statistic AS VS Percent: 84.78 %
Brady Statistic RA Percent Paced: 15.54 %
Brady Statistic RV Percent Paced: 0.17 %
Date Time Interrogation Session: 20210818014040
Implantable Lead Implant Date: 20190805
Implantable Lead Implant Date: 20190805
Implantable Lead Implant Date: 20190805
Implantable Lead Location: 753858
Implantable Lead Location: 753859
Implantable Lead Location: 753860
Implantable Lead Model: 4398
Implantable Lead Model: 5076
Implantable Lead Model: 5076
Implantable Lead Serial Number: 11111
Implantable Pulse Generator Implant Date: 20190805
Lead Channel Impedance Value: 304 Ohm
Lead Channel Impedance Value: 418 Ohm
Lead Channel Impedance Value: 437 Ohm
Lead Channel Impedance Value: 456 Ohm
Lead Channel Impedance Value: 456 Ohm
Lead Channel Impedance Value: 475 Ohm
Lead Channel Impedance Value: 475 Ohm
Lead Channel Impedance Value: 494 Ohm
Lead Channel Impedance Value: 513 Ohm
Lead Channel Impedance Value: 703 Ohm
Lead Channel Impedance Value: 722 Ohm
Lead Channel Impedance Value: 760 Ohm
Lead Channel Impedance Value: 798 Ohm
Lead Channel Impedance Value: 817 Ohm
Lead Channel Pacing Threshold Amplitude: 0.5 V
Lead Channel Pacing Threshold Amplitude: 0.625 V
Lead Channel Pacing Threshold Amplitude: 1.125 V
Lead Channel Pacing Threshold Pulse Width: 0.4 ms
Lead Channel Pacing Threshold Pulse Width: 0.4 ms
Lead Channel Pacing Threshold Pulse Width: 0.4 ms
Lead Channel Sensing Intrinsic Amplitude: 1.5 mV
Lead Channel Sensing Intrinsic Amplitude: 1.5 mV
Lead Channel Sensing Intrinsic Amplitude: 12.125 mV
Lead Channel Sensing Intrinsic Amplitude: 12.125 mV
Lead Channel Setting Pacing Amplitude: 1.5 V
Lead Channel Setting Pacing Amplitude: 2 V
Lead Channel Setting Pacing Pulse Width: 0.4 ms
Lead Channel Setting Sensing Sensitivity: 2 mV

## 2020-05-10 NOTE — Progress Notes (Signed)
Remote pacemaker transmission.   

## 2020-07-09 ENCOUNTER — Encounter: Payer: Medicare HMO | Admitting: Dermatology

## 2020-08-07 ENCOUNTER — Ambulatory Visit (INDEPENDENT_AMBULATORY_CARE_PROVIDER_SITE_OTHER): Payer: Medicare HMO

## 2020-08-07 DIAGNOSIS — I442 Atrioventricular block, complete: Secondary | ICD-10-CM

## 2020-08-07 LAB — CUP PACEART REMOTE DEVICE CHECK
Battery Remaining Longevity: 145 mo
Battery Voltage: 3.02 V
Brady Statistic AP VP Percent: 0.28 %
Brady Statistic AP VS Percent: 11.44 %
Brady Statistic AS VP Percent: 0.03 %
Brady Statistic AS VS Percent: 88.25 %
Brady Statistic RA Percent Paced: 11.91 %
Brady Statistic RV Percent Paced: 0.31 %
Date Time Interrogation Session: 20211116002939
Implantable Lead Implant Date: 20190805
Implantable Lead Implant Date: 20190805
Implantable Lead Implant Date: 20190805
Implantable Lead Location: 753858
Implantable Lead Location: 753859
Implantable Lead Location: 753860
Implantable Lead Model: 4398
Implantable Lead Model: 5076
Implantable Lead Model: 5076
Implantable Lead Serial Number: 11111
Implantable Pulse Generator Implant Date: 20190805
Lead Channel Impedance Value: 304 Ohm
Lead Channel Impedance Value: 399 Ohm
Lead Channel Impedance Value: 399 Ohm
Lead Channel Impedance Value: 399 Ohm
Lead Channel Impedance Value: 456 Ohm
Lead Channel Impedance Value: 456 Ohm
Lead Channel Impedance Value: 475 Ohm
Lead Channel Impedance Value: 494 Ohm
Lead Channel Impedance Value: 494 Ohm
Lead Channel Impedance Value: 722 Ohm
Lead Channel Impedance Value: 722 Ohm
Lead Channel Impedance Value: 779 Ohm
Lead Channel Impedance Value: 817 Ohm
Lead Channel Impedance Value: 817 Ohm
Lead Channel Pacing Threshold Amplitude: 0.625 V
Lead Channel Pacing Threshold Amplitude: 0.625 V
Lead Channel Pacing Threshold Amplitude: 1.125 V
Lead Channel Pacing Threshold Pulse Width: 0.4 ms
Lead Channel Pacing Threshold Pulse Width: 0.4 ms
Lead Channel Pacing Threshold Pulse Width: 0.4 ms
Lead Channel Sensing Intrinsic Amplitude: 1.625 mV
Lead Channel Sensing Intrinsic Amplitude: 1.625 mV
Lead Channel Sensing Intrinsic Amplitude: 13.5 mV
Lead Channel Sensing Intrinsic Amplitude: 13.5 mV
Lead Channel Setting Pacing Amplitude: 1.5 V
Lead Channel Setting Pacing Amplitude: 2 V
Lead Channel Setting Pacing Pulse Width: 0.4 ms
Lead Channel Setting Sensing Sensitivity: 2 mV

## 2020-08-09 NOTE — Progress Notes (Signed)
Remote pacemaker transmission.   

## 2020-09-25 DIAGNOSIS — L570 Actinic keratosis: Secondary | ICD-10-CM | POA: Diagnosis not present

## 2020-09-25 DIAGNOSIS — L578 Other skin changes due to chronic exposure to nonionizing radiation: Secondary | ICD-10-CM | POA: Diagnosis not present

## 2020-09-25 DIAGNOSIS — D0439 Carcinoma in situ of skin of other parts of face: Secondary | ICD-10-CM | POA: Diagnosis not present

## 2020-09-25 DIAGNOSIS — Z85828 Personal history of other malignant neoplasm of skin: Secondary | ICD-10-CM | POA: Diagnosis not present

## 2020-09-25 DIAGNOSIS — D485 Neoplasm of uncertain behavior of skin: Secondary | ICD-10-CM | POA: Diagnosis not present

## 2020-09-25 DIAGNOSIS — L738 Other specified follicular disorders: Secondary | ICD-10-CM | POA: Diagnosis not present

## 2020-09-26 ENCOUNTER — Other Ambulatory Visit: Payer: Self-pay

## 2020-09-26 ENCOUNTER — Encounter: Payer: Self-pay | Admitting: *Deleted

## 2020-09-26 ENCOUNTER — Emergency Department: Payer: Medicare HMO

## 2020-09-26 ENCOUNTER — Emergency Department
Admission: EM | Admit: 2020-09-26 | Discharge: 2020-09-26 | Disposition: A | Discharge status: Home / Self Care | Payer: Medicare HMO | Attending: Emergency Medicine | Admitting: Emergency Medicine

## 2020-09-26 DIAGNOSIS — R42 Dizziness and giddiness: Secondary | ICD-10-CM | POA: Insufficient documentation

## 2020-09-26 DIAGNOSIS — Z87891 Personal history of nicotine dependence: Secondary | ICD-10-CM | POA: Insufficient documentation

## 2020-09-26 DIAGNOSIS — Z0000 Encounter for general adult medical examination without abnormal findings: Secondary | ICD-10-CM | POA: Diagnosis not present

## 2020-09-26 DIAGNOSIS — I959 Hypotension, unspecified: Secondary | ICD-10-CM | POA: Diagnosis not present

## 2020-09-26 DIAGNOSIS — I429 Cardiomyopathy, unspecified: Secondary | ICD-10-CM | POA: Diagnosis not present

## 2020-09-26 DIAGNOSIS — R000 Tachycardia, unspecified: Secondary | ICD-10-CM | POA: Diagnosis not present

## 2020-09-26 DIAGNOSIS — R0789 Other chest pain: Secondary | ICD-10-CM | POA: Insufficient documentation

## 2020-09-26 DIAGNOSIS — R110 Nausea: Secondary | ICD-10-CM | POA: Insufficient documentation

## 2020-09-26 DIAGNOSIS — R079 Chest pain, unspecified: Secondary | ICD-10-CM | POA: Diagnosis not present

## 2020-09-26 DIAGNOSIS — Z23 Encounter for immunization: Secondary | ICD-10-CM | POA: Diagnosis not present

## 2020-09-26 DIAGNOSIS — Z85828 Personal history of other malignant neoplasm of skin: Secondary | ICD-10-CM | POA: Diagnosis not present

## 2020-09-26 LAB — CBC
HCT: 40.8 % (ref 36.0–46.0)
Hemoglobin: 13.3 g/dL (ref 12.0–15.0)
MCH: 30.3 pg (ref 26.0–34.0)
MCHC: 32.6 g/dL (ref 30.0–36.0)
MCV: 92.9 fL (ref 80.0–100.0)
Platelets: 218 10*3/uL (ref 150–400)
RBC: 4.39 MIL/uL (ref 3.87–5.11)
RDW: 12.7 % (ref 11.5–15.5)
WBC: 5.7 10*3/uL (ref 4.0–10.5)
nRBC: 0 % (ref 0.0–0.2)

## 2020-09-26 LAB — BASIC METABOLIC PANEL
Anion gap: 8 (ref 5–15)
BUN: 20 mg/dL (ref 8–23)
CO2: 28 mmol/L (ref 22–32)
Calcium: 9.7 mg/dL (ref 8.9–10.3)
Chloride: 103 mmol/L (ref 98–111)
Creatinine, Ser: 0.63 mg/dL (ref 0.44–1.00)
GFR, Estimated: >60 mL/min (ref >60)
Glucose, Bld: 129 mg/dL — ABNORMAL HIGH (ref 70–99)
Potassium: 4.1 mmol/L (ref 3.5–5.1)
Sodium: 139 mmol/L (ref 135–145)

## 2020-09-26 LAB — TROPONIN I (HIGH SENSITIVITY)
Troponin I (High Sensitivity): 6 ng/L (ref <18)
Troponin I (High Sensitivity): 5 ng/L (ref <18)

## 2020-09-26 MED ORDER — ONDANSETRON 4 MG PO TBDP
4.0000 mg | ORAL_TABLET | Freq: Once | ORAL | Status: AC
  Administered 2020-09-26: 4 mg via ORAL
  Filled 2020-09-26: qty 1

## 2020-09-26 NOTE — ED Provider Notes (Signed)
[facility] Emergency Department Provider Note   ____________________________________________   Event Date/Time   First MD Initiated Contact with Patient 09/26/20 [phone]     (approximate)  I have reviewed the triage vital signs and the nursing notes.   HISTORY  Chief Complaint Dizziness    HPI Doris [name] is a 70 y.o. female patient arrived via EMS home for complaint of dizziness.  Patient state last night she slept on the couch and when she stood up abruptly she got dizzy.  Patientdizziness caused nausea and some chest pressure.  Patient denies vomiting.  Patient has a history of CHF and has a pacemaker.  Patient states in the last 2 hours her complaint has resolved.  Patient initially rated her pain as a 4/10.  Patient denies pain at this time.  Patient described the pain as "achy".  Patient states she was also anxious but that also has resolved.         Past Medical History:  Diagnosis Date  . Cancer (HCC)    Basal cell ( bridge of her nose)   . Headache    Migraines  . Hx of migraine headaches   . Medical history non-contributory    Mitral valve prolapse at 1 yrs of age    Patient Active Problem List   Diagnosis Date Noted  . Painful veins 08/07/2018    Past Surgical History:  Procedure Laterality Date  . CARDIAC CATHETERIZATION    . CATARACT EXTRACTION  02/2017  . COLONOSCOPY WITH PROPOFOL N/A 03/03/2017   Procedure: COLONOSCOPY WITH PROPOFOL;  Surgeon: Garlan Fair, MD;  Location: [facility];  Service: Endoscopy;  Laterality: N/A;  . OOPHORECTOMY  2005    Prior to Admission medications   Medication Sig Start Date End Date Taking? Authorizing Provider  b complex vitamins tablet Take 0.5 tablets by mouth daily.    [provider]  bisoprolol (ZEBETA) 5 MG tablet TAKE 1/2 TABLET BY MOUTH DAILY 12/01/19   Deboraha Sprang, MD  Cholecalciferol (VITAMIN D3) LIQD Take 1 drop by mouth daily.     [provider]   Javier Docker Oil 350 MG CAPS Take 350 mg by mouth.    [provider]  Multiple Vitamins-Minerals (ICAPS) CAPS Take by mouth daily.     [provider]  Polyethyl Glycol-Propyl Glycol (SYSTANE ULTRA OP) Place 1 drop into the right eye every 2 (two) hours as needed (dry eyes).    [provider]  tacrolimus (PROTOPIC) 0.1 % ointment Apply  a small amount to affected area twice a day 07/04/19   [provider]    Allergies Benzocaine, Carboxymethylcellulose, Clarithromycin, Doxycycline, Fentanyl, Lidocaine, Metronidazole, Monosodium glutamate, and Penicillins  Family History  Problem Relation Age of Onset  . Diabetes Mother   . Cancer Mother   . Cancer Father     Social History Social History   Tobacco Use  . Smoking status: Former Smoker    Types: Cigarettes  . Smokeless tobacco: Never Used  Vaping Use  . Vaping Use: Never used  Substance Use Topics  . Alcohol use: Not Currently    Comment: Beer a month  . Drug use: No    Review of Systems Constitutional: No fever/chills Eyes: No visual changes. ENT: No sore throat. Cardiovascular: Initial chest pain.  Respiratory: Denies shortness of breath. Gastrointestinal: No abdominal pain.  Nausea without vomiting. no diarrhea.  No constipation. Genitourinary: Negative for dysuria. Musculoskeletal: Negative for back pain. Skin: Negative for rash.  Neurological: Negative for headaches, focal weakness or numbness.  Transient vertigo Allergic/Immunilogical: See extensive medication allergy list.  ____________________________________________   PHYSICAL EXAM:  VITAL SIGNS: ED Triage Vitals  Enc Vitals Group     BP 09/26/20 0136 126/64     Pulse Rate 09/26/20 0136 (!) 59     Resp 09/26/20 0136 20     Temp 09/26/20 0143 97.8 F (36.6 C)     Temp Source 09/26/20 0136 Oral     SpO2 09/26/20 0122 99 %     Weight 09/26/20 0137 128 lb (58.1 kg)     Height 09/26/20 0137 5\' 3"  (1.6 m)     Head  Circumference --      Peak Flow --      Pain Score 09/26/20 0137 4     Pain Loc --      Pain Edu? --      Excl. in [place]? --    Constitutional: Alert and oriented. Well appearing and in no acute distress. Eyes: Conjunctivae are normal. PERRL. EOMI. Head: Atraumatic. Nose: No congestion/rhinnorhea. Mouth/Throat: Mucous membranes are moist.  Oropharynx non-erythematous. Neck: No stridor. Hematological/Lymphatic/Immunilogical: No cervical lymphadenopathy. Cardiovascular: Normal rate, regular rhythm. Grossly normal heart sounds.  Good peripheral circulation.  No acute findings on orthostatics. Respiratory: Normal respiratory effort.  No retractions. Lungs CTAB. Gastrointestinal: Soft and nontender. No distention. No abdominal bruits. No CVA tenderness. Genitourinary: Deferred Musculoskeletal: No lower extremity tenderness nor edema.  No joint effusions. Neurologic:  Normal speech and language. No gross focal neurologic deficits are appreciated. No gait instability.  Negative Romberg Skin:  Skin is warm, dry and intact. No rash noted. Psychiatric: Mood and affect are normal. Speech and behavior are normal.  ____________________________________________   LABS (all labs ordered are listed, but only abnormal results are displayed)  Labs Reviewed  BASIC METABOLIC PANEL - Abnormal; Notable for the following components:      Result Value   Glucose, Bld 129 (*)    All other components within normal limits  CBC  TROPONIN I (HIGH SENSITIVITY)  TROPONIN I (HIGH SENSITIVITY)   ____________________________________________  EKG Read by heart station Dr. with no acute findings.  ____________________________________________  [place], personally viewed and evaluated these images (plain radiographs) as part of my medical decision making, as well as reviewing the written report by the radiologist.  ED MD interpretation: No acute findings on chest x-ray.  Official radiology  report(s): DG Chest 2 View  Result Date: 09/26/2020 CLINICAL DATA:  Chest pain, nausea EXAM: CHEST - 2 VIEW COMPARISON:  01/26/2018 FINDINGS: Frontal and lateral views of the chest demonstrate multi lead pacer, with lead in the region of the right atrium, right ventricle, and coronary sinus. Mitral valve annuloplasty ring is noted. The cardiac silhouette is unremarkable. No airspace disease, effusion, or pneumothorax. No acute bony abnormalities. Surgical clips right axilla. IMPRESSION: 1. No acute intrathoracic process. Electronically Signed   By: Randa Ngo M.D.   On: 09/26/2020 02:15    ____________________________________________   PROCEDURES  Procedure(s) performed (including Critical Care):  Procedures   ____________________________________________   INITIAL IMPRESSION / ASSESSMENT AND PLAN / ED COURSE  As part of my medical decision making, I reviewed the following data within the [place]         Patient arrived via EMS from home secondary to vertigo.  Complaint has resolved before physical exam.  Patientis tilt negative.  Discussed no acute findings with labs and a chest x-ray  today.  Patient complaint physical exam consistent with transient vertigo.  Discussed sequela of vertigo with patient.   Patient has a routine schedule appointment with family doctor at 10:00 today.  Return to ED if condition worsens.     ____________________________________________   FINAL CLINICAL IMPRESSION(S) / ED DIAGNOSES  Final diagnoses:  Vertigo     ED Discharge Orders    None      *Please note:  Doris [name] was evaluated in Emergency Department on 09/26/2020 for the symptoms described in the history of present illness. She was evaluated in the context of the global COVID-19 pandemic, which necessitated consideration that the patient might be at risk for infection with the SARS-CoV-2 virus that causes COVID-19. Institutional protocols and algorithms that  pertain to the evaluation of patients at risk for COVID-19 are in a state of rapid change based on information released by regulatory bodies including the CDC and federal and state organizations. These policies and algorithms were followed during the patient's care in the ED.  Some ED evaluations and interventions may be delayed as a result of limited staffing during and the pandemic.*   Note:  This document was prepared using Dragon voice recognition software and may include unintentional dictation errors.    Joni Reining, PA-C 09/26/20 [id number]    Shaune Pollack, MD 09/26/20 [phone]

## 2020-09-26 NOTE — ED Triage Notes (Signed)
EMS brings pt in from home for c/o CP and abd pain accomp by nausea; hx CHF, pacemaker

## 2020-09-26 NOTE — ED Notes (Signed)
Pt taken to triage for vs and troponin; med admin per pt request for nausea

## 2020-09-26 NOTE — ED Triage Notes (Signed)
Pt brought in via ems from home.  Pt states she woke up tonight dizzy.  Pt reports chest pressure and nausea.  No vomiting.  Pt alert  Speech clear.

## 2020-09-26 NOTE — ED Notes (Signed)
NAD noted at time of D/C. Pt denies questions or concerns. Pt ambulatory to the lobby at this time. Verbal consent for D/C obtained at this time.  

## 2020-09-26 NOTE — Discharge Instructions (Addendum)
Follow discharge care instruction continue previous medications.  Follow-up with today's scheduled doctor visit.

## 2020-10-04 DIAGNOSIS — Z20822 Contact with and (suspected) exposure to covid-19: Secondary | ICD-10-CM | POA: Diagnosis not present

## 2020-10-16 DIAGNOSIS — D2339 Other benign neoplasm of skin of other parts of face: Secondary | ICD-10-CM | POA: Diagnosis not present

## 2020-10-16 DIAGNOSIS — C44321 Squamous cell carcinoma of skin of nose: Secondary | ICD-10-CM | POA: Diagnosis not present

## 2020-10-25 DIAGNOSIS — L905 Scar conditions and fibrosis of skin: Secondary | ICD-10-CM | POA: Diagnosis not present

## 2020-10-29 DIAGNOSIS — J343 Hypertrophy of nasal turbinates: Secondary | ICD-10-CM | POA: Diagnosis not present

## 2020-10-29 DIAGNOSIS — R0981 Nasal congestion: Secondary | ICD-10-CM | POA: Diagnosis not present

## 2020-10-29 DIAGNOSIS — Z87891 Personal history of nicotine dependence: Secondary | ICD-10-CM | POA: Diagnosis not present

## 2020-11-06 ENCOUNTER — Ambulatory Visit (INDEPENDENT_AMBULATORY_CARE_PROVIDER_SITE_OTHER): Payer: Medicare HMO

## 2020-11-06 ENCOUNTER — Telehealth: Payer: Self-pay | Admitting: Emergency Medicine

## 2020-11-06 DIAGNOSIS — I442 Atrioventricular block, complete: Secondary | ICD-10-CM

## 2020-11-06 LAB — CUP PACEART REMOTE DEVICE CHECK
Battery Remaining Longevity: 142 mo
Battery Voltage: 3.02 V
Brady Statistic AP VP Percent: 0.21 %
Brady Statistic AP VS Percent: 9.16 %
Brady Statistic AS VP Percent: 0.03 %
Brady Statistic AS VS Percent: 90.6 %
Brady Statistic RA Percent Paced: 9.44 %
Brady Statistic RV Percent Paced: 0.23 %
Date Time Interrogation Session: 20220215074803
Implantable Lead Implant Date: 20190805
Implantable Lead Implant Date: 20190805
Implantable Lead Implant Date: 20190805
Implantable Lead Location: 753858
Implantable Lead Location: 753859
Implantable Lead Location: 753860
Implantable Lead Model: 4398
Implantable Lead Model: 5076
Implantable Lead Model: 5076
Implantable Lead Serial Number: 11111
Implantable Pulse Generator Implant Date: 20190805
Lead Channel Impedance Value: 285 Ohm
Lead Channel Impedance Value: 380 Ohm
Lead Channel Impedance Value: 399 Ohm
Lead Channel Impedance Value: 399 Ohm
Lead Channel Impedance Value: 437 Ohm
Lead Channel Impedance Value: 437 Ohm
Lead Channel Impedance Value: 456 Ohm
Lead Channel Impedance Value: 456 Ohm
Lead Channel Impedance Value: 456 Ohm
Lead Channel Impedance Value: 703 Ohm
Lead Channel Impedance Value: 703 Ohm
Lead Channel Impedance Value: 741 Ohm
Lead Channel Impedance Value: 760 Ohm
Lead Channel Impedance Value: 779 Ohm
Lead Channel Pacing Threshold Amplitude: 0.625 V
Lead Channel Pacing Threshold Amplitude: 0.75 V
Lead Channel Pacing Threshold Amplitude: 1.125 V
Lead Channel Pacing Threshold Pulse Width: 0.4 ms
Lead Channel Pacing Threshold Pulse Width: 0.4 ms
Lead Channel Pacing Threshold Pulse Width: 0.4 ms
Lead Channel Sensing Intrinsic Amplitude: 1.375 mV
Lead Channel Sensing Intrinsic Amplitude: 1.375 mV
Lead Channel Sensing Intrinsic Amplitude: 11.375 mV
Lead Channel Sensing Intrinsic Amplitude: 11.375 mV
Lead Channel Setting Pacing Amplitude: 1.5 V
Lead Channel Setting Pacing Amplitude: 2 V
Lead Channel Setting Pacing Pulse Width: 0.4 ms
Lead Channel Setting Sensing Sensitivity: 2 mV

## 2020-11-06 NOTE — Telephone Encounter (Signed)
Mail box full. Showed multiple episodes of SVT and questionable NSVT

## 2020-11-09 NOTE — Telephone Encounter (Signed)
Contacted patient by phone. Patient advised of her last transmission on 11/06/2020, showing multiple episodes of SVT and questionable NSVT (elevated heart rates). Patient denies any symptoms on these dates and is currently asymptomatic at this time. Patient states that she feels fine and denies any chest pain. Patient also states that she has been under a lot of stress with house repairs and that she has been exercising a over the last month. Patient requested dates of episodes 10/02/2020 and 11/03/2020. Dates given to patient along with [facility] number [phone]. Patient was advised if any device problems or concerns to call the [facility]. Patient was also advised if any problems or concerns that she could send a manual remote transmission and, the [facility] could review the transmission. Patient verbalized understand and states that she will keep track of her pulse rate when exercising and follow recommendations given.

## 2020-11-13 NOTE — Progress Notes (Signed)
Remote pacemaker transmission.   

## 2020-12-24 ENCOUNTER — Other Ambulatory Visit: Payer: Self-pay | Admitting: Internal Medicine

## 2020-12-24 DIAGNOSIS — I429 Cardiomyopathy, unspecified: Secondary | ICD-10-CM

## 2021-01-07 DIAGNOSIS — R1031 Right lower quadrant pain: Secondary | ICD-10-CM | POA: Diagnosis not present

## 2021-01-10 DIAGNOSIS — H353112 Nonexudative age-related macular degeneration, right eye, intermediate dry stage: Secondary | ICD-10-CM | POA: Diagnosis not present

## 2021-01-10 DIAGNOSIS — I480 Paroxysmal atrial fibrillation: Secondary | ICD-10-CM | POA: Diagnosis not present

## 2021-01-10 DIAGNOSIS — I442 Atrioventricular block, complete: Secondary | ICD-10-CM | POA: Diagnosis not present

## 2021-01-10 DIAGNOSIS — I420 Dilated cardiomyopathy: Secondary | ICD-10-CM | POA: Diagnosis not present

## 2021-01-10 DIAGNOSIS — I38 Endocarditis, valve unspecified: Secondary | ICD-10-CM | POA: Diagnosis not present

## 2021-01-10 DIAGNOSIS — Z9889 Other specified postprocedural states: Secondary | ICD-10-CM | POA: Diagnosis not present

## 2021-01-10 DIAGNOSIS — E782 Mixed hyperlipidemia: Secondary | ICD-10-CM | POA: Diagnosis not present

## 2021-01-10 DIAGNOSIS — I5022 Chronic systolic (congestive) heart failure: Secondary | ICD-10-CM | POA: Diagnosis not present

## 2021-01-10 DIAGNOSIS — Z950 Presence of cardiac pacemaker: Secondary | ICD-10-CM | POA: Diagnosis not present

## 2021-02-05 ENCOUNTER — Ambulatory Visit (INDEPENDENT_AMBULATORY_CARE_PROVIDER_SITE_OTHER): Payer: Medicare HMO

## 2021-02-05 DIAGNOSIS — I442 Atrioventricular block, complete: Secondary | ICD-10-CM

## 2021-02-05 LAB — CUP PACEART REMOTE DEVICE CHECK
Battery Remaining Longevity: 139 mo
Battery Voltage: 3.02 V
Brady Statistic AP VP Percent: 0.16 %
Brady Statistic AP VS Percent: 7.67 %
Brady Statistic AS VP Percent: 0.03 %
Brady Statistic AS VS Percent: 92.15 %
Brady Statistic RA Percent Paced: 7.95 %
Brady Statistic RV Percent Paced: 0.18 %
Date Time Interrogation Session: 20220516082325
Implantable Lead Implant Date: 20190805
Implantable Lead Implant Date: 20190805
Implantable Lead Implant Date: 20190805
Implantable Lead Location: 753858
Implantable Lead Location: 753859
Implantable Lead Location: 753860
Implantable Lead Model: 4398
Implantable Lead Model: 5076
Implantable Lead Model: 5076
Implantable Lead Serial Number: 11111
Implantable Pulse Generator Implant Date: 20190805
Lead Channel Impedance Value: 285 Ohm
Lead Channel Impedance Value: 380 Ohm
Lead Channel Impedance Value: 380 Ohm
Lead Channel Impedance Value: 380 Ohm
Lead Channel Impedance Value: 437 Ohm
Lead Channel Impedance Value: 437 Ohm
Lead Channel Impedance Value: 456 Ohm
Lead Channel Impedance Value: 475 Ohm
Lead Channel Impedance Value: 475 Ohm
Lead Channel Impedance Value: 703 Ohm
Lead Channel Impedance Value: 722 Ohm
Lead Channel Impedance Value: 741 Ohm
Lead Channel Impedance Value: 779 Ohm
Lead Channel Impedance Value: 798 Ohm
Lead Channel Pacing Threshold Amplitude: 0.625 V
Lead Channel Pacing Threshold Amplitude: 0.625 V
Lead Channel Pacing Threshold Amplitude: 1.125 V
Lead Channel Pacing Threshold Pulse Width: 0.4 ms
Lead Channel Pacing Threshold Pulse Width: 0.4 ms
Lead Channel Pacing Threshold Pulse Width: 0.4 ms
Lead Channel Sensing Intrinsic Amplitude: 1 mV
Lead Channel Sensing Intrinsic Amplitude: 1 mV
Lead Channel Sensing Intrinsic Amplitude: 11.625 mV
Lead Channel Sensing Intrinsic Amplitude: 11.625 mV
Lead Channel Setting Pacing Amplitude: 1.5 V
Lead Channel Setting Pacing Amplitude: 2 V
Lead Channel Setting Pacing Pulse Width: 0.4 ms
Lead Channel Setting Sensing Sensitivity: 2 mV

## 2021-02-28 DIAGNOSIS — H26492 Other secondary cataract, left eye: Secondary | ICD-10-CM | POA: Diagnosis not present

## 2021-02-28 DIAGNOSIS — H04123 Dry eye syndrome of bilateral lacrimal glands: Secondary | ICD-10-CM | POA: Diagnosis not present

## 2021-02-28 DIAGNOSIS — H43813 Vitreous degeneration, bilateral: Secondary | ICD-10-CM | POA: Diagnosis not present

## 2021-02-28 DIAGNOSIS — H353132 Nonexudative age-related macular degeneration, bilateral, intermediate dry stage: Secondary | ICD-10-CM | POA: Diagnosis not present

## 2021-02-28 NOTE — Progress Notes (Signed)
Remote pacemaker transmission.   

## 2021-03-05 DIAGNOSIS — D692 Other nonthrombocytopenic purpura: Secondary | ICD-10-CM | POA: Diagnosis not present

## 2021-03-05 DIAGNOSIS — L928 Other granulomatous disorders of the skin and subcutaneous tissue: Secondary | ICD-10-CM | POA: Diagnosis not present

## 2021-04-01 ENCOUNTER — Other Ambulatory Visit: Payer: Self-pay | Admitting: Internal Medicine

## 2021-04-01 DIAGNOSIS — I429 Cardiomyopathy, unspecified: Secondary | ICD-10-CM

## 2021-04-03 DIAGNOSIS — L821 Other seborrheic keratosis: Secondary | ICD-10-CM | POA: Diagnosis not present

## 2021-04-03 DIAGNOSIS — Z859 Personal history of malignant neoplasm, unspecified: Secondary | ICD-10-CM | POA: Diagnosis not present

## 2021-04-03 DIAGNOSIS — D225 Melanocytic nevi of trunk: Secondary | ICD-10-CM | POA: Diagnosis not present

## 2021-04-03 DIAGNOSIS — L578 Other skin changes due to chronic exposure to nonionizing radiation: Secondary | ICD-10-CM | POA: Diagnosis not present

## 2021-04-03 DIAGNOSIS — Z85828 Personal history of other malignant neoplasm of skin: Secondary | ICD-10-CM | POA: Diagnosis not present

## 2021-04-08 DIAGNOSIS — I480 Paroxysmal atrial fibrillation: Secondary | ICD-10-CM | POA: Diagnosis not present

## 2021-04-08 DIAGNOSIS — I420 Dilated cardiomyopathy: Secondary | ICD-10-CM | POA: Diagnosis not present

## 2021-04-08 DIAGNOSIS — Z8619 Personal history of other infectious and parasitic diseases: Secondary | ICD-10-CM | POA: Diagnosis not present

## 2021-04-08 DIAGNOSIS — M810 Age-related osteoporosis without current pathological fracture: Secondary | ICD-10-CM | POA: Diagnosis not present

## 2021-04-08 DIAGNOSIS — R21 Rash and other nonspecific skin eruption: Secondary | ICD-10-CM | POA: Diagnosis not present

## 2021-04-08 DIAGNOSIS — Z950 Presence of cardiac pacemaker: Secondary | ICD-10-CM | POA: Diagnosis not present

## 2021-04-08 DIAGNOSIS — I5022 Chronic systolic (congestive) heart failure: Secondary | ICD-10-CM | POA: Diagnosis not present

## 2021-04-08 DIAGNOSIS — I442 Atrioventricular block, complete: Secondary | ICD-10-CM | POA: Diagnosis not present

## 2021-04-08 DIAGNOSIS — Z9889 Other specified postprocedural states: Secondary | ICD-10-CM | POA: Diagnosis not present

## 2021-04-30 ENCOUNTER — Encounter: Payer: Self-pay | Admitting: Internal Medicine

## 2021-04-30 ENCOUNTER — Other Ambulatory Visit: Payer: Self-pay

## 2021-04-30 ENCOUNTER — Ambulatory Visit (INDEPENDENT_AMBULATORY_CARE_PROVIDER_SITE_OTHER): Payer: Medicare HMO | Admitting: Internal Medicine

## 2021-04-30 VITALS — BP 120/80 | HR 73 | Ht 63.0 in | Wt 131.0 lb

## 2021-04-30 DIAGNOSIS — I059 Rheumatic mitral valve disease, unspecified: Secondary | ICD-10-CM

## 2021-04-30 DIAGNOSIS — I429 Cardiomyopathy, unspecified: Secondary | ICD-10-CM | POA: Diagnosis not present

## 2021-04-30 DIAGNOSIS — Z950 Presence of cardiac pacemaker: Secondary | ICD-10-CM

## 2021-04-30 DIAGNOSIS — Z79899 Other long term (current) drug therapy: Secondary | ICD-10-CM | POA: Diagnosis not present

## 2021-04-30 DIAGNOSIS — R000 Tachycardia, unspecified: Secondary | ICD-10-CM

## 2021-04-30 DIAGNOSIS — I442 Atrioventricular block, complete: Secondary | ICD-10-CM | POA: Diagnosis not present

## 2021-04-30 MED ORDER — SPIRONOLACTONE 25 MG PO TABS: 12.5000 mg | ORAL_TABLET | Freq: Every day | ORAL | 3 refills | Status: DC

## 2021-04-30 NOTE — Progress Notes (Signed)
Patient ID: Doris [name], female   DOB: 1951-08-26, 70 y.o.   MRN: [id number]      Patient Care Team: Rusty Aus, MD as PCP - General (Internal Medicine)   HPI  Doris [name] is a 70 y.o. 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  Switched off because of side effects.   Started on beta-blockers for blood pressure and tachycardia.  8/20 had started her on spironolactone; it was stopped 2/21 for reasons I cannot easily discern.  She does not recall ever having been on it   Today, the patient denies chest pain, shortness of breath, nocturnal dyspnea, orthopnea or peripheral edema.  There have been no palpitations, lightheadedness or syncope.   DATE TEST EF   6/19 Echo   50 % MR Mod-Severe  9/19 Echo   40 % MR none  6/20 Echo  35-40%   5/21 Echo ([place]) 40%      Date Cr K Hgb  8/20 0.73 4.3 14.4   01/22 0.63 4.1       Past Medical History:  Diagnosis Date   Cancer ([place])    Basal cell ( bridge of her nose)    Headache    Migraines   Hx of migraine headaches    Medical history non-contributory    Mitral valve prolapse at 19 yrs of age    Past Surgical History:  Procedure Laterality Date   CARDIAC CATHETERIZATION     CATARACT EXTRACTION  02/2017   COLONOSCOPY WITH PROPOFOL N/A 03/03/2017   Procedure: COLONOSCOPY WITH PROPOFOL;  Surgeon: Garlan Fair, MD;  Location: [facility];  Service: Endoscopy;  Laterality: N/A;   OOPHORECTOMY  2005    Current Meds  Medication Sig   b complex vitamins tablet Take 0.5 tablets by mouth daily.   bisoprolol (ZEBETA) 5 MG tablet TAKE 1/2 TABLET BY MOUTH EVERY DAY   Cholecalciferol (VITAMIN D3) LIQD Take 1 drop by mouth daily.    Krill Oil 350 MG CAPS Take 350 mg by mouth.   Multiple Vitamins-Minerals (ICAPS) CAPS Take by mouth daily.    Polyethyl  Glycol-Propyl Glycol (SYSTANE ULTRA OP) Place 1 drop into the right eye every 2 (two) hours as needed (dry eyes).    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- she was 71 years old Has patient had a PCN reaction causing immediate rash, facial/tongue/throat swelling, SOB or lightheadedness with hypotension: Unknown Has patient had a PCN reaction causing severe rash involving mucus membranes or skin necrosis: Unknown Has patient had a PCN reaction that required hospitalization: Yes Has patient had a PCN reaction occurring within the last 10 years: No If all of the above answers are "NO", then may proceed with Cephalos      Review of Systems negative except from HPI and PMH  Physical  Exam BP 120/80 (BP Location: Left Arm, Patient Position: Sitting, Cuff Size: Normal)   Pulse 73   Ht '5\' 3"'$  (1.6 m)   Wt 131 lb (59.4 kg)   SpO2 97%   BMI 23.21 kg/m  Well developed and well nourished in no acute distress HENT normal Neck supple with JVP-flat Lungs Clear Device pocket well healed; without hematoma or erythema.  There is no tethering  Regular rate and rhythm, no murmur Abd-soft with active BS No Clubbing cyanosis  edema Skin-warm and dry A & Oriented  Grossly normal sensory and motor function  ECG: sinus @ 73 15/07/38    Assessment and  Plan  Sinus tachycardia  Can not exclude atrial  Mitral valve repair  Complete heart block-transient  VT- nonsustained   Junctional  tachycardia  CRT-P-Medtronic  Cardio myopathy-mild  losartan intolerance  Euvolemic.  With her nonischemic cardiomyopathy and ejection fraction in the 40% range, we will begin her on spironolactone 12.5 mg.  Reviewed side effects and the need for potassium surveillance.  We will go gently with her history of low blood pressure.  She does not recall even having been on it nor why it was stopped with the dates as noted above.  Interval nonsustained ventricular tachycardia; continue her on bisoprolol at 2.5 mg daily.  No atrial fibrillation  Device function is normal  Infrequent ventricular pacing     Current medicines are reviewed at length with the patient today .  The patient does not  have concerns regarding medicines.

## 2021-04-30 NOTE — Patient Instructions (Signed)
Medication Instructions:  - Your physician has recommended you make the following change in your medication:   1) START aldactone (spironolactone) 25 mg- take 0.5 tablet (12.5 mg) by mouth once daily  *If you need a refill on your cardiac medications before your next appointment, please call your pharmacy*   Lab Work: - Your physician recommends that you return for lab work in: 2 weeks- [place] Entrance at [facility] 1st desk on the right to check in, past the screening table Lab hours: Monday- Friday (7:30 am- 5:30 pm)   If you have labs (blood work) drawn today and your tests are completely normal, you will receive your results only by: MyChart Message (if you have MyChart) OR A paper copy in the mail If you have any lab test that is abnormal or we need to change your treatment, we will call you to review the results.   Testing/Procedures: - none ordered   Follow-Up: At [facility], you and your health needs are our priority.  As part of our continuing mission to provide you with exceptional heart care, we have created designated Provider Care Teams.  These Care Teams include your primary Cardiologist (physician) and Advanced Practice Providers (APPs -  Physician Assistants and Nurse Practitioners) who all work together to provide you with the care you need, when you need it.  We recommend signing up for the patient portal called "MyChart".  Sign up information is provided on this After Visit Summary.  MyChart is used to connect with patients for Virtual Visits (Telemedicine).  Patients are able to view lab/test results, encounter notes, upcoming appointments, etc.  Non-urgent messages can be sent to your provider as well.   To learn more about what you can do with MyChart, go to [email or web address].    Your next appointment:   1 year(s)  The format for your next appointment:   In Person  Provider:   Virl Axe, MD   Other Instructions N/a

## 2021-05-07 ENCOUNTER — Ambulatory Visit (INDEPENDENT_AMBULATORY_CARE_PROVIDER_SITE_OTHER): Payer: Medicare HMO

## 2021-05-07 DIAGNOSIS — I442 Atrioventricular block, complete: Secondary | ICD-10-CM | POA: Diagnosis not present

## 2021-05-07 LAB — CUP PACEART REMOTE DEVICE CHECK
Battery Remaining Longevity: 136 mo
Battery Voltage: 3.02 V
Brady Statistic AP VP Percent: 0.2 %
Brady Statistic AP VS Percent: 27.03 %
Brady Statistic AS VP Percent: 0.04 %
Brady Statistic AS VS Percent: 72.74 %
Brady Statistic RA Percent Paced: 27.69 %
Brady Statistic RV Percent Paced: 0.23 %
Date Time Interrogation Session: 20220816012216
Implantable Lead Implant Date: 20190805
Implantable Lead Implant Date: 20190805
Implantable Lead Implant Date: 20190805
Implantable Lead Location: 753858
Implantable Lead Location: 753859
Implantable Lead Location: 753860
Implantable Lead Model: 4398
Implantable Lead Model: 5076
Implantable Lead Model: 5076
Implantable Lead Serial Number: 11111
Implantable Pulse Generator Implant Date: 20190805
Lead Channel Impedance Value: 285 Ohm
Lead Channel Impedance Value: 399 Ohm
Lead Channel Impedance Value: 399 Ohm
Lead Channel Impedance Value: 399 Ohm
Lead Channel Impedance Value: 456 Ohm
Lead Channel Impedance Value: 456 Ohm
Lead Channel Impedance Value: 456 Ohm
Lead Channel Impedance Value: 475 Ohm
Lead Channel Impedance Value: 513 Ohm
Lead Channel Impedance Value: 741 Ohm
Lead Channel Impedance Value: 741 Ohm
Lead Channel Impedance Value: 798 Ohm
Lead Channel Impedance Value: 836 Ohm
Lead Channel Impedance Value: 836 Ohm
Lead Channel Pacing Threshold Amplitude: 0.625 V
Lead Channel Pacing Threshold Amplitude: 0.75 V
Lead Channel Pacing Threshold Amplitude: 1.125 V
Lead Channel Pacing Threshold Pulse Width: 0.4 ms
Lead Channel Pacing Threshold Pulse Width: 0.4 ms
Lead Channel Pacing Threshold Pulse Width: 0.4 ms
Lead Channel Sensing Intrinsic Amplitude: 1.625 mV
Lead Channel Sensing Intrinsic Amplitude: 1.625 mV
Lead Channel Sensing Intrinsic Amplitude: 11.5 mV
Lead Channel Sensing Intrinsic Amplitude: 11.5 mV
Lead Channel Setting Pacing Amplitude: 1.5 V
Lead Channel Setting Pacing Amplitude: 2 V
Lead Channel Setting Pacing Pulse Width: 0.4 ms
Lead Channel Setting Sensing Sensitivity: 2 mV

## 2021-05-16 ENCOUNTER — Other Ambulatory Visit
Admission: RE | Admit: 2021-05-16 | Discharge: 2021-05-16 | Disposition: A | Discharge status: Home / Self Care | Payer: Medicare HMO | Source: Ambulatory Visit | Attending: Internal Medicine | Admitting: Internal Medicine

## 2021-05-16 DIAGNOSIS — I429 Cardiomyopathy, unspecified: Secondary | ICD-10-CM

## 2021-05-16 DIAGNOSIS — Z79899 Other long term (current) drug therapy: Secondary | ICD-10-CM | POA: Insufficient documentation

## 2021-05-16 LAB — BASIC METABOLIC PANEL
Anion gap: 10 (ref 5–15)
BUN: 13 mg/dL (ref 8–23)
CO2: 27 mmol/L (ref 22–32)
Calcium: 9.7 mg/dL (ref 8.9–10.3)
Chloride: 98 mmol/L (ref 98–111)
Creatinine, Ser: 0.84 mg/dL (ref 0.44–1.00)
GFR, Estimated: >60 mL/min (ref >60)
Glucose, Bld: 110 mg/dL — ABNORMAL HIGH (ref 70–99)
Potassium: 4 mmol/L (ref 3.5–5.1)
Sodium: 135 mmol/L (ref 135–145)

## 2021-05-28 NOTE — Progress Notes (Signed)
Remote pacemaker transmission.   

## 2021-06-05 DIAGNOSIS — M810 Age-related osteoporosis without current pathological fracture: Secondary | ICD-10-CM | POA: Diagnosis not present

## 2021-06-05 DIAGNOSIS — R7989 Other specified abnormal findings of blood chemistry: Secondary | ICD-10-CM | POA: Diagnosis not present

## 2021-06-05 DIAGNOSIS — I420 Dilated cardiomyopathy: Secondary | ICD-10-CM | POA: Diagnosis not present

## 2021-06-05 DIAGNOSIS — E782 Mixed hyperlipidemia: Secondary | ICD-10-CM | POA: Diagnosis not present

## 2021-06-18 DIAGNOSIS — M47818 Spondylosis without myelopathy or radiculopathy, sacral and sacrococcygeal region: Secondary | ICD-10-CM | POA: Diagnosis not present

## 2021-06-18 DIAGNOSIS — M7918 Myalgia, other site: Secondary | ICD-10-CM | POA: Diagnosis not present

## 2021-06-18 DIAGNOSIS — M25551 Pain in right hip: Secondary | ICD-10-CM | POA: Diagnosis not present

## 2021-06-18 DIAGNOSIS — M1611 Unilateral primary osteoarthritis, right hip: Secondary | ICD-10-CM | POA: Diagnosis not present

## 2021-06-27 ENCOUNTER — Other Ambulatory Visit: Payer: Self-pay | Admitting: Internal Medicine

## 2021-06-27 DIAGNOSIS — I429 Cardiomyopathy, unspecified: Secondary | ICD-10-CM

## 2021-06-27 NOTE — Telephone Encounter (Signed)
This is a [place] pt 

## 2021-06-28 DIAGNOSIS — M25551 Pain in right hip: Secondary | ICD-10-CM | POA: Diagnosis not present

## 2021-07-19 DIAGNOSIS — M1611 Unilateral primary osteoarthritis, right hip: Secondary | ICD-10-CM | POA: Diagnosis not present

## 2021-07-19 DIAGNOSIS — M7918 Myalgia, other site: Secondary | ICD-10-CM | POA: Diagnosis not present

## 2021-07-19 DIAGNOSIS — M76899 Other specified enthesopathies of unspecified lower limb, excluding foot: Secondary | ICD-10-CM | POA: Diagnosis not present

## 2021-07-19 DIAGNOSIS — M25551 Pain in right hip: Secondary | ICD-10-CM | POA: Diagnosis not present

## 2021-07-19 DIAGNOSIS — M76891 Other specified enthesopathies of right lower limb, excluding foot: Secondary | ICD-10-CM | POA: Diagnosis not present

## 2021-07-19 DIAGNOSIS — M47818 Spondylosis without myelopathy or radiculopathy, sacral and sacrococcygeal region: Secondary | ICD-10-CM | POA: Diagnosis not present

## 2021-07-29 DIAGNOSIS — M810 Age-related osteoporosis without current pathological fracture: Secondary | ICD-10-CM | POA: Diagnosis not present

## 2021-07-29 DIAGNOSIS — E782 Mixed hyperlipidemia: Secondary | ICD-10-CM | POA: Diagnosis not present

## 2021-07-29 DIAGNOSIS — R946 Abnormal results of thyroid function studies: Secondary | ICD-10-CM | POA: Diagnosis not present

## 2021-07-29 DIAGNOSIS — I420 Dilated cardiomyopathy: Secondary | ICD-10-CM | POA: Diagnosis not present

## 2021-08-06 ENCOUNTER — Ambulatory Visit (INDEPENDENT_AMBULATORY_CARE_PROVIDER_SITE_OTHER): Payer: Medicare HMO

## 2021-08-06 DIAGNOSIS — I442 Atrioventricular block, complete: Secondary | ICD-10-CM

## 2021-08-06 LAB — CUP PACEART REMOTE DEVICE CHECK
Battery Remaining Longevity: 133 mo
Battery Voltage: 3.01 V
Brady Statistic AP VP Percent: 0.15 %
Brady Statistic AP VS Percent: 29.31 %
Brady Statistic AS VP Percent: 0.02 %
Brady Statistic AS VS Percent: 70.51 %
Brady Statistic RA Percent Paced: 30.01 %
Brady Statistic RV Percent Paced: 0.17 %
Date Time Interrogation Session: 20221115080941
Implantable Lead Implant Date: 20190805
Implantable Lead Implant Date: 20190805
Implantable Lead Implant Date: 20190805
Implantable Lead Location: 753858
Implantable Lead Location: 753859
Implantable Lead Location: 753860
Implantable Lead Model: 4398
Implantable Lead Model: 5076
Implantable Lead Model: 5076
Implantable Lead Serial Number: 11111
Implantable Pulse Generator Implant Date: 20190805
Lead Channel Impedance Value: 285 Ohm
Lead Channel Impedance Value: 342 Ohm
Lead Channel Impedance Value: 399 Ohm
Lead Channel Impedance Value: 399 Ohm
Lead Channel Impedance Value: 475 Ohm
Lead Channel Impedance Value: 494 Ohm
Lead Channel Impedance Value: 513 Ohm
Lead Channel Impedance Value: 532 Ohm
Lead Channel Impedance Value: 608 Ohm
Lead Channel Impedance Value: 760 Ohm
Lead Channel Impedance Value: 798 Ohm
Lead Channel Impedance Value: 798 Ohm
Lead Channel Impedance Value: 855 Ohm
Lead Channel Impedance Value: 912 Ohm
Lead Channel Pacing Threshold Amplitude: 0.625 V
Lead Channel Pacing Threshold Amplitude: 0.625 V
Lead Channel Pacing Threshold Amplitude: 1.125 V
Lead Channel Pacing Threshold Pulse Width: 0.4 ms
Lead Channel Pacing Threshold Pulse Width: 0.4 ms
Lead Channel Pacing Threshold Pulse Width: 0.4 ms
Lead Channel Sensing Intrinsic Amplitude: 1.25 mV
Lead Channel Sensing Intrinsic Amplitude: 1.25 mV
Lead Channel Sensing Intrinsic Amplitude: 11.625 mV
Lead Channel Sensing Intrinsic Amplitude: 11.625 mV
Lead Channel Setting Pacing Amplitude: 1.5 V
Lead Channel Setting Pacing Amplitude: 2 V
Lead Channel Setting Pacing Pulse Width: 0.4 ms
Lead Channel Setting Sensing Sensitivity: 2 mV

## 2021-08-14 NOTE — Progress Notes (Signed)
Remote pacemaker transmission.   

## 2021-09-03 DIAGNOSIS — H26492 Other secondary cataract, left eye: Secondary | ICD-10-CM | POA: Diagnosis not present

## 2021-09-04 ENCOUNTER — Telehealth: Payer: Self-pay | Admitting: Internal Medicine

## 2021-09-04 NOTE — Telephone Encounter (Signed)
Pt c/o medication issue:  1. Name of Medication: Prednisone eye drop   2. How are you currently taking this medication (dosage and times per day)? 1 drop 3x a day for seven days   3. Are you having a reaction (difficulty breathing--STAT)? Doesn't know   4. What is your medication issue? Doris [name] is calling stating she has been started on prednisone eye drops. She states she did some research in regards to this and found information stating this can be fatal to your heart. She is calling to see if she should continue taking these. Asked the patient if she has had any heart related symptoms since starting the drops yesterday. She states she believes she may have passed out yesterday due to falling asleep watching the [place] game yesterday which is something she never does. Reports when she woke up she had not dreamed and it felt as if she had blacked out, but she did not have any symptoms prior to blacking out so she may have fell asleep. She also reported her feet being numb.

## 2021-09-04 NOTE — Telephone Encounter (Signed)
Discussed w/ pharmD. Spoke to pt.   Informed pt that she should call and discuss with her general cardiologist about this, at the [facility]. Aware that this should not be an issue but she should confirm with them as Dr Caryl Comes is following her PPM and is not an issue from a PPM standpoint. Pt is agreeable to plan and will contact them for advisement.

## 2021-10-08 DIAGNOSIS — Z124 Encounter for screening for malignant neoplasm of cervix: Secondary | ICD-10-CM | POA: Diagnosis not present

## 2021-10-21 ENCOUNTER — Other Ambulatory Visit: Payer: Self-pay | Admitting: Internal Medicine

## 2021-10-21 DIAGNOSIS — I429 Cardiomyopathy, unspecified: Secondary | ICD-10-CM

## 2021-10-29 DIAGNOSIS — L718 Other rosacea: Secondary | ICD-10-CM | POA: Diagnosis not present

## 2021-10-29 DIAGNOSIS — L720 Epidermal cyst: Secondary | ICD-10-CM | POA: Diagnosis not present

## 2021-10-29 DIAGNOSIS — Z872 Personal history of diseases of the skin and subcutaneous tissue: Secondary | ICD-10-CM | POA: Diagnosis not present

## 2021-10-29 DIAGNOSIS — D225 Melanocytic nevi of trunk: Secondary | ICD-10-CM | POA: Diagnosis not present

## 2021-10-29 DIAGNOSIS — Z859 Personal history of malignant neoplasm, unspecified: Secondary | ICD-10-CM | POA: Diagnosis not present

## 2021-10-29 DIAGNOSIS — L578 Other skin changes due to chronic exposure to nonionizing radiation: Secondary | ICD-10-CM | POA: Diagnosis not present

## 2021-10-31 ENCOUNTER — Other Ambulatory Visit: Payer: Self-pay | Admitting: Internal Medicine

## 2021-10-31 ENCOUNTER — Other Ambulatory Visit: Payer: Self-pay | Admitting: Family Medicine

## 2021-10-31 DIAGNOSIS — Z1231 Encounter for screening mammogram for malignant neoplasm of breast: Secondary | ICD-10-CM

## 2021-11-01 DIAGNOSIS — M810 Age-related osteoporosis without current pathological fracture: Secondary | ICD-10-CM | POA: Diagnosis not present

## 2021-11-05 ENCOUNTER — Ambulatory Visit (INDEPENDENT_AMBULATORY_CARE_PROVIDER_SITE_OTHER): Payer: Medicare HMO

## 2021-11-05 DIAGNOSIS — I442 Atrioventricular block, complete: Secondary | ICD-10-CM

## 2021-11-05 LAB — CUP PACEART REMOTE DEVICE CHECK
Battery Remaining Longevity: 130 mo
Battery Voltage: 3.01 V
Brady Statistic AP VP Percent: 0.21 %
Brady Statistic AP VS Percent: 34.43 %
Brady Statistic AS VP Percent: 0.03 %
Brady Statistic AS VS Percent: 65.34 %
Brady Statistic RA Percent Paced: 35.51 %
Brady Statistic RV Percent Paced: 0.24 %
Date Time Interrogation Session: 20230214002204
Implantable Lead Implant Date: 20190805
Implantable Lead Implant Date: 20190805
Implantable Lead Implant Date: 20190805
Implantable Lead Location: 753858
Implantable Lead Location: 753859
Implantable Lead Location: 753860
Implantable Lead Model: 4398
Implantable Lead Model: 5076
Implantable Lead Model: 5076
Implantable Lead Serial Number: 11111
Implantable Pulse Generator Implant Date: 20190805
Lead Channel Impedance Value: 1064 Ohm
Lead Channel Impedance Value: 285 Ohm
Lead Channel Impedance Value: 342 Ohm
Lead Channel Impedance Value: 380 Ohm
Lead Channel Impedance Value: 418 Ohm
Lead Channel Impedance Value: 475 Ohm
Lead Channel Impedance Value: 494 Ohm
Lead Channel Impedance Value: 494 Ohm
Lead Channel Impedance Value: 684 Ohm
Lead Channel Impedance Value: 760 Ohm
Lead Channel Impedance Value: 779 Ohm
Lead Channel Impedance Value: 798 Ohm
Lead Channel Impedance Value: 893 Ohm
Lead Channel Impedance Value: 931 Ohm
Lead Channel Pacing Threshold Amplitude: 0.625 V
Lead Channel Pacing Threshold Amplitude: 0.75 V
Lead Channel Pacing Threshold Amplitude: 1.125 V
Lead Channel Pacing Threshold Pulse Width: 0.4 ms
Lead Channel Pacing Threshold Pulse Width: 0.4 ms
Lead Channel Pacing Threshold Pulse Width: 0.4 ms
Lead Channel Sensing Intrinsic Amplitude: 10.875 mV
Lead Channel Sensing Intrinsic Amplitude: 10.875 mV
Lead Channel Sensing Intrinsic Amplitude: 2.875 mV
Lead Channel Sensing Intrinsic Amplitude: 2.875 mV
Lead Channel Setting Pacing Amplitude: 1.5 V
Lead Channel Setting Pacing Amplitude: 2 V
Lead Channel Setting Pacing Pulse Width: 0.4 ms
Lead Channel Setting Sensing Sensitivity: 2 mV

## 2021-11-11 NOTE — Progress Notes (Signed)
Remote pacemaker transmission.   

## 2021-11-15 DIAGNOSIS — H5711 Ocular pain, right eye: Secondary | ICD-10-CM | POA: Diagnosis not present

## 2021-11-26 ENCOUNTER — Other Ambulatory Visit: Payer: Self-pay | Admitting: Nurse Practitioner

## 2021-11-26 DIAGNOSIS — R1031 Right lower quadrant pain: Secondary | ICD-10-CM | POA: Diagnosis not present

## 2021-12-06 ENCOUNTER — Ambulatory Visit
Admission: RE | Admit: 2021-12-06 | Discharge: 2021-12-06 | Disposition: A | Discharge status: Home / Self Care | Payer: Medicare HMO | Source: Ambulatory Visit | Attending: Nurse Practitioner | Admitting: Nurse Practitioner

## 2021-12-06 ENCOUNTER — Ambulatory Visit
Admission: RE | Admit: 2021-12-06 | Discharge: 2021-12-06 | Disposition: A | Discharge status: Home / Self Care | Payer: Medicare HMO | Source: Ambulatory Visit | Attending: Family Medicine | Admitting: Family Medicine

## 2021-12-06 ENCOUNTER — Other Ambulatory Visit: Payer: Self-pay

## 2021-12-06 DIAGNOSIS — Z1231 Encounter for screening mammogram for malignant neoplasm of breast: Secondary | ICD-10-CM | POA: Diagnosis not present

## 2021-12-06 DIAGNOSIS — K7689 Other specified diseases of liver: Secondary | ICD-10-CM | POA: Diagnosis not present

## 2021-12-06 DIAGNOSIS — N281 Cyst of kidney, acquired: Secondary | ICD-10-CM | POA: Diagnosis not present

## 2021-12-06 DIAGNOSIS — R1031 Right lower quadrant pain: Secondary | ICD-10-CM | POA: Diagnosis not present

## 2021-12-06 MED ORDER — IOHEXOL 300 MG/ML  SOLN
100.0000 mL | Freq: Once | INTRAMUSCULAR | Status: AC | PRN
  Administered 2021-12-06: 100 mL via INTRAVENOUS

## 2021-12-12 ENCOUNTER — Inpatient Hospital Stay
Admission: RE | Admit: 2021-12-12 | Discharge: 2021-12-12 | Disposition: A | Discharge status: Home / Self Care | Payer: Self-pay | Source: Ambulatory Visit | Attending: *Deleted | Admitting: *Deleted

## 2021-12-12 ENCOUNTER — Other Ambulatory Visit: Payer: Self-pay | Admitting: *Deleted

## 2021-12-12 DIAGNOSIS — Z1231 Encounter for screening mammogram for malignant neoplasm of breast: Secondary | ICD-10-CM

## 2022-01-14 DIAGNOSIS — Z9889 Other specified postprocedural states: Secondary | ICD-10-CM | POA: Diagnosis not present

## 2022-01-14 DIAGNOSIS — Z950 Presence of cardiac pacemaker: Secondary | ICD-10-CM | POA: Diagnosis not present

## 2022-01-14 DIAGNOSIS — I5022 Chronic systolic (congestive) heart failure: Secondary | ICD-10-CM | POA: Diagnosis not present

## 2022-01-14 DIAGNOSIS — I480 Paroxysmal atrial fibrillation: Secondary | ICD-10-CM | POA: Diagnosis not present

## 2022-01-14 DIAGNOSIS — I340 Nonrheumatic mitral (valve) insufficiency: Secondary | ICD-10-CM | POA: Diagnosis not present

## 2022-02-04 ENCOUNTER — Ambulatory Visit (INDEPENDENT_AMBULATORY_CARE_PROVIDER_SITE_OTHER): Payer: Medicare HMO

## 2022-02-04 DIAGNOSIS — I442 Atrioventricular block, complete: Secondary | ICD-10-CM | POA: Diagnosis not present

## 2022-02-04 LAB — CUP PACEART REMOTE DEVICE CHECK
Battery Remaining Longevity: 127 mo
Battery Voltage: 3.01 V
Brady Statistic AP VP Percent: 0.19 %
Brady Statistic AP VS Percent: 25.77 %
Brady Statistic AS VP Percent: 0.04 %
Brady Statistic AS VS Percent: 74 %
Brady Statistic RA Percent Paced: 27.27 %
Brady Statistic RV Percent Paced: 0.23 %
Date Time Interrogation Session: 20230516012202
Implantable Lead Implant Date: 20190805
Implantable Lead Implant Date: 20190805
Implantable Lead Implant Date: 20190805
Implantable Lead Location: 753858
Implantable Lead Location: 753859
Implantable Lead Location: 753860
Implantable Lead Model: 4398
Implantable Lead Model: 5076
Implantable Lead Model: 5076
Implantable Lead Serial Number: 11111
Implantable Pulse Generator Implant Date: 20190805
Lead Channel Impedance Value: 285 Ohm
Lead Channel Impedance Value: 361 Ohm
Lead Channel Impedance Value: 399 Ohm
Lead Channel Impedance Value: 399 Ohm
Lead Channel Impedance Value: 475 Ohm
Lead Channel Impedance Value: 475 Ohm
Lead Channel Impedance Value: 532 Ohm
Lead Channel Impedance Value: 627 Ohm
Lead Channel Impedance Value: 760 Ohm
Lead Channel Impedance Value: 760 Ohm
Lead Channel Impedance Value: 798 Ohm
Lead Channel Impedance Value: 874 Ohm
Lead Channel Impedance Value: 893 Ohm
Lead Channel Impedance Value: 969 Ohm
Lead Channel Pacing Threshold Amplitude: 0.625 V
Lead Channel Pacing Threshold Amplitude: 0.625 V
Lead Channel Pacing Threshold Amplitude: 1.125 V
Lead Channel Pacing Threshold Pulse Width: 0.4 ms
Lead Channel Pacing Threshold Pulse Width: 0.4 ms
Lead Channel Pacing Threshold Pulse Width: 0.4 ms
Lead Channel Sensing Intrinsic Amplitude: 1 mV
Lead Channel Sensing Intrinsic Amplitude: 1 mV
Lead Channel Sensing Intrinsic Amplitude: 11.375 mV
Lead Channel Sensing Intrinsic Amplitude: 11.375 mV
Lead Channel Setting Pacing Amplitude: 1.5 V
Lead Channel Setting Pacing Amplitude: 2 V
Lead Channel Setting Pacing Pulse Width: 0.4 ms
Lead Channel Setting Sensing Sensitivity: 2 mV

## 2022-02-05 ENCOUNTER — Encounter: Payer: Self-pay | Admitting: Internal Medicine

## 2022-02-18 ENCOUNTER — Ambulatory Visit: Payer: Medicare HMO | Admitting: Anesthesiology

## 2022-02-18 ENCOUNTER — Other Ambulatory Visit: Payer: Self-pay

## 2022-02-18 ENCOUNTER — Ambulatory Visit
Admission: RE | Admit: 2022-02-18 | Discharge: 2022-02-18 | Disposition: A | Discharge status: Home / Self Care | Payer: Medicare HMO | Attending: Gastroenterology | Admitting: Gastroenterology

## 2022-02-18 ENCOUNTER — Encounter
Admission: RE | Disposition: A | Discharge status: Home / Self Care | Payer: Self-pay | Source: Home / Self Care | Attending: *Deleted

## 2022-02-18 ENCOUNTER — Encounter: Payer: Self-pay | Admitting: *Deleted

## 2022-02-18 DIAGNOSIS — K219 Gastro-esophageal reflux disease without esophagitis: Secondary | ICD-10-CM | POA: Diagnosis not present

## 2022-02-18 DIAGNOSIS — K5730 Diverticulosis of large intestine without perforation or abscess without bleeding: Secondary | ICD-10-CM | POA: Insufficient documentation

## 2022-02-18 DIAGNOSIS — R1031 Right lower quadrant pain: Secondary | ICD-10-CM | POA: Diagnosis not present

## 2022-02-18 DIAGNOSIS — Z1211 Encounter for screening for malignant neoplasm of colon: Secondary | ICD-10-CM | POA: Diagnosis not present

## 2022-02-18 DIAGNOSIS — Z79899 Other long term (current) drug therapy: Secondary | ICD-10-CM | POA: Insufficient documentation

## 2022-02-18 DIAGNOSIS — Z87891 Personal history of nicotine dependence: Secondary | ICD-10-CM | POA: Insufficient documentation

## 2022-02-18 DIAGNOSIS — I10 Essential (primary) hypertension: Secondary | ICD-10-CM | POA: Insufficient documentation

## 2022-02-18 DIAGNOSIS — Z86010 Personal history of colonic polyps: Secondary | ICD-10-CM | POA: Diagnosis not present

## 2022-02-18 HISTORY — PX: COLONOSCOPY WITH PROPOFOL: SHX5780

## 2022-02-18 HISTORY — DX: Essential (primary) hypertension: I10

## 2022-02-18 SURGERY — COLONOSCOPY WITH PROPOFOL
Anesthesia: General

## 2022-02-18 MED ORDER — EPHEDRINE SULFATE (PRESSORS) 50 MG/ML IJ SOLN
INTRAMUSCULAR | Status: DC | PRN
  Administered 2022-02-18: 5 mg via INTRAVENOUS

## 2022-02-18 MED ORDER — SODIUM CHLORIDE 0.9 % IV SOLN: INTRAVENOUS | Status: DC

## 2022-02-18 MED ORDER — PROPOFOL 500 MG/50ML IV EMUL
INTRAVENOUS | Status: DC | PRN
  Administered 2022-02-18: 125 ug/kg/min via INTRAVENOUS

## 2022-02-18 MED ORDER — PROPOFOL 500 MG/50ML IV EMUL
INTRAVENOUS | Status: AC
  Filled 2022-02-18: qty 150

## 2022-02-18 MED ORDER — PROPOFOL 10 MG/ML IV BOLUS
INTRAVENOUS | Status: AC
  Filled 2022-02-18: qty 20

## 2022-02-18 MED ORDER — PROPOFOL 10 MG/ML IV BOLUS
INTRAVENOUS | Status: DC | PRN
  Administered 2022-02-18: 50 mg via INTRAVENOUS
  Administered 2022-02-18: 40 mg via INTRAVENOUS

## 2022-02-18 MED ORDER — PROPOFOL 500 MG/50ML IV EMUL
INTRAVENOUS | Status: AC
  Filled 2022-02-18: qty 50

## 2022-02-18 NOTE — H&P (Signed)
Outpatient short stay form Pre-procedure 02/18/2022  Lesly Rubenstein, MD  Primary Physician: Juluis Pitch, MD  Reason for visit:  Right lower quadrant pain  History of present illness:    71 y/o lady with history of mitral valve repair and HFrEF here for colonoscopy for right lower quadrant pain. No blood thinners. No family history of GI malignancies. No significant abdominal surgeries.    Current Facility-Administered Medications:    0.9 %  sodium chloride infusion, , Intravenous, Continuous, Nickalus Thornsberry, Hilton Cork, MD, Last Rate: 20 mL/hr at 02/18/22 1307, New Bag at 02/18/22 1307  Medications Prior to Admission  Medication Sig Dispense Refill Last Dose   b complex vitamins tablet Take 0.5 tablets by mouth daily.   Past Week   bisoprolol (ZEBETA) 5 MG tablet TAKE 1/2 TABLET BY MOUTH EVERY DAY 45 tablet 1 02/18/2022 at 0830   Cholecalciferol (VITAMIN D3) LIQD Take 1 drop by mouth daily.    Past Week   Krill Oil 350 MG CAPS Take 350 mg by mouth.   Past Week   Multiple Vitamins-Minerals (ICAPS) CAPS Take by mouth daily.    Past Week   Polyethyl Glycol-Propyl Glycol (SYSTANE ULTRA OP) Place 1 drop into the right eye every 2 (two) hours as needed (dry eyes).   02/18/2022 at 0830   spironolactone (ALDACTONE) 25 MG tablet Take 0.5 tablets (12.5 mg total) by mouth daily. 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- she was 72 years old Has patient had a PCN reaction causing  immediate rash, facial/tongue/throat swelling, SOB or lightheadedness with hypotension: Unknown Has patient had a PCN reaction causing severe rash involving mucus membranes or skin necrosis: Unknown Has patient had a PCN reaction that required hospitalization: Yes Has patient had a PCN reaction occurring within the last 10 years: No If all of the above answers are "NO", then may proceed with Cephalos     Past Medical History:  Diagnosis Date   Cancer ([place])    Basal cell ( bridge of her nose)    Headache    Migraines   Hx of migraine headaches    Hypertension    Medical history non-contributory    Mitral valve prolapse at 57 yrs of age    Review of systems:  Otherwise negative.    Physical Exam  Gen: Alert, oriented. Appears stated age.  HEENT: PERRLA. Lungs: No respiratory distress CV: RRR Abd: soft, benign, no masses Ext: No edema    Planned procedures: Proceed with colonoscopy. The patient understands the nature of the planned procedure, indications, risks, alternatives and potential complications including but not limited to bleeding, infection, perforation, damage to internal organs and possible oversedation/side effects from anesthesia. The patient agrees and gives consent to proceed.  Please refer to procedure notes for findings, recommendations and patient disposition/instructions.     Lesly Rubenstein, MD [facility]

## 2022-02-18 NOTE — Transfer of Care (Signed)
Immediate Anesthesia Transfer of Care Note  Patient: Doris [name]  Procedure(s) Performed: COLONOSCOPY WITH PROPOFOL  Patient Location: Endoscopy Unit  Anesthesia Type:General  Level of Consciousness: awake, drowsy and patient cooperative  Airway & Oxygen Therapy: Patient Spontanous Breathing and Patient connected to face mask oxygen  Post-op Assessment: Report given to RN and Post -op Vital signs reviewed and stable  Post vital signs: Reviewed and stable  Last Vitals:  Vitals Value Taken Time  BP 96/54 02/18/22 1345  Temp 36.8 C 02/18/22 1345  Pulse 68 02/18/22 1349  Resp 16 02/18/22 1349  SpO2 100 % 02/18/22 1349  Vitals shown include unvalidated device data.  Last Pain:  Vitals:   02/18/22 1345  TempSrc: Temporal  PainSc: Asleep         Complications: No notable events documented.

## 2022-02-18 NOTE — Anesthesia Preprocedure Evaluation (Signed)
Anesthesia Evaluation  Patient identified by MRN, date of birth, ID band Patient awake    Reviewed: Allergy & Precautions, NPO status , Patient's Chart, lab work & pertinent test results  History of Anesthesia Complications (+) Emergence Delirium and history of anesthetic complications  Airway Mallampati: III  TM Distance: >3 FB Neck ROM: full    Dental  (+) Chipped   Pulmonary neg shortness of breath, former smoker,    Pulmonary exam normal        Cardiovascular Exercise Tolerance: Good hypertension, Normal cardiovascular exam+ Valvular Problems/Murmurs      Neuro/Psych  Headaches, negative psych ROS   GI/Hepatic Neg liver ROS, GERD  Controlled,  Endo/Other  negative endocrine ROS  Renal/GU negative Renal ROS  negative genitourinary   Musculoskeletal   Abdominal   Peds  Hematology negative hematology ROS (+)   Anesthesia Other Findings Past Medical History: No date: Cancer ([place])     Comment:  Basal cell ( bridge of her nose)  No date: Headache     Comment:  Migraines No date: Hx of migraine headaches No date: Hypertension No date: Medical history non-contributory     Comment:  Mitral valve prolapse at 19 yrs of age  Past Surgical History: No date: CARDIAC CATHETERIZATION 02/2017: CATARACT EXTRACTION 03/03/2017: COLONOSCOPY WITH PROPOFOL; N/A     Comment:  Procedure: COLONOSCOPY WITH PROPOFOL;  Surgeon: Garlan Fair, MD;  Location: [facility];  Service:               Endoscopy;  Laterality: N/A; 2005: OOPHORECTOMY  BMI    Body Mass Index: 21.95 kg/m      Reproductive/Obstetrics negative OB ROS                             Anesthesia Physical Anesthesia Plan  ASA: 3  Anesthesia Plan: General   Post-op Pain Management:    Induction: Intravenous  PONV Risk Score and Plan: Propofol infusion and TIVA  Airway Management Planned: Natural Airway and  Nasal Cannula  Additional Equipment:   Intra-op Plan:   Post-operative Plan:   Informed Consent: I have reviewed the patients History and Physical, chart, labs and discussed the procedure including the risks, benefits and alternatives for the proposed anesthesia with the patient or authorized representative who has indicated his/her understanding and acceptance.     Dental Advisory Given  Plan Discussed with: Anesthesiologist, CRNA and Surgeon  Anesthesia Plan Comments: (Patient consented for risks of anesthesia including but not limited to:  - adverse reactions to medications - risk of airway placement if required - damage to eyes, teeth, lips or other oral mucosa - nerve damage due to positioning  - sore throat or hoarseness - Damage to heart, brain, nerves, lungs, other parts of body or loss of life  Patient voiced understanding.)        Anesthesia Quick Evaluation

## 2022-02-18 NOTE — Interval H&P Note (Signed)
History and Physical Interval Note:  02/18/2022 1:22 PM  Doris [name]  has presented today for surgery, with the diagnosis of H/O TA polyps.  The various methods of treatment have been discussed with the patient and family. After consideration of risks, benefits and other options for treatment, the patient has consented to  Procedure(s): COLONOSCOPY WITH PROPOFOL (N/A) as a surgical intervention.  The patient's history has been reviewed, patient examined, no change in status, stable for surgery.  I have reviewed the patient's chart and labs.  Questions were answered to the patient's satisfaction.     Lesly Rubenstein  Ok to proceed with colonoscopy

## 2022-02-18 NOTE — Op Note (Signed)
[facility] Patient Name: Doris [name] Procedure Date: 02/18/2022 1:18 PM MRN: [id number] Account #: [id number] Date of Birth: August 16, 1951 Admit Type: Outpatient Age: 71 Room: [facility] ENDO ROOM 1 Gender: Female Note Status: Finalized Instrument Name: Park Meo [id number] Procedure:             Colonoscopy Indications:           Abdominal pain in the right lower quadrant Providers:             Andrey Farmer MD, MD Referring MD:          Youlanda Roys. Lovie Macadamia, MD (Referring MD) Medicines:             Monitored Anesthesia Care Complications:         No immediate complications. Procedure:             Pre-Anesthesia Assessment:                        - Prior to the procedure, a History and Physical was                         performed, and patient medications and allergies were                         reviewed. The patient is competent. The risks and                         benefits of the procedure and the sedation options and                         risks were discussed with the patient. All questions                         were answered and informed consent was obtained.                         Patient identification and proposed procedure were                         verified by the physician, the nurse, the                         anesthesiologist, the anesthetist and the technician                         in the endoscopy suite. Mental Status Examination:                         alert and oriented. Airway Examination: normal                         oropharyngeal airway and neck mobility. Respiratory                         Examination: clear to auscultation. CV Examination:                         normal. Prophylactic Antibiotics: The patient does not  require prophylactic antibiotics. Prior                         Anticoagulants: The patient has taken no previous                         anticoagulant or antiplatelet  agents. ASA Grade                         Assessment: II - A patient with mild systemic disease.                         After reviewing the risks and benefits, the patient                         was deemed in satisfactory condition to undergo the                         procedure. The anesthesia plan was to use monitored                         anesthesia care (MAC). Immediately prior to                         administration of medications, the patient was                         re-assessed for adequacy to receive sedatives. The                         heart rate, respiratory rate, oxygen saturations,                         blood pressure, adequacy of pulmonary ventilation, and                         response to care were monitored throughout the                         procedure. The physical status of the patient was                         re-assessed after the procedure.                        After obtaining informed consent, the colonoscope was                         passed under direct vision. Throughout the procedure,                         the patient's blood pressure, pulse, and oxygen                         saturations were monitored continuously. The                         Colonoscope was introduced through the anus and  advanced to the the cecum, identified by appendiceal                         orifice and ileocecal valve. The colonoscopy was                         performed without difficulty. The patient tolerated                         the procedure well. The quality of the bowel                         preparation was good. Findings:      The perianal and digital rectal examinations were normal.      A few small-mouthed diverticula were found in the sigmoid colon.      The exam was otherwise without abnormality on direct and retroflexion       views. Impression:            - Diverticulosis in the sigmoid colon.                        -  The examination was otherwise normal on direct and                         retroflexion views.                        - No specimens collected. Recommendation:        - Discharge patient to home.                        - Resume previous diet.                        - Continue present medications.                        - Repeat colonoscopy is not recommended due to current                         age (42 years or older) for surveillance.                        - Return to referring physician as previously                         scheduled. Procedure Code(s):     --- Professional ---                        [phone], Colonoscopy, flexible; diagnostic, including                         collection of specimen(s) by brushing or washing, when                         performed (separate procedure) Diagnosis Code(s):     --- Professional ---                        R10.31, Right lower quadrant  pain                        K57.30, Diverticulosis of large intestine without                         perforation or abscess without bleeding CPT copyright 2019 American Medical Association. All rights reserved. The codes documented in this report are preliminary and upon coder review may  be revised to meet current compliance requirements. Andrey Farmer MD, MD 02/18/2022 1:45:58 PM Number of Addenda: 0 Note Initiated On: 02/18/2022 1:18 PM Scope Withdrawal Time: 0 hours 6 minutes 38 seconds  Total Procedure Duration: 0 hours 12 minutes 39 seconds  Estimated Blood Loss:  Estimated blood loss: none.      [facility]

## 2022-02-19 ENCOUNTER — Encounter: Payer: Self-pay | Admitting: Gastroenterology

## 2022-02-19 NOTE — Anesthesia Postprocedure Evaluation (Signed)
Anesthesia Post Note  Patient: Doris [name]  Procedure(s) Performed: COLONOSCOPY WITH PROPOFOL  Patient location during evaluation: PACU Anesthesia Type: General Level of consciousness: awake and alert Pain management: pain level controlled Vital Signs Assessment: post-procedure vital signs reviewed and stable Respiratory status: spontaneous breathing, nonlabored ventilation, respiratory function stable and patient connected to nasal cannula oxygen Cardiovascular status: blood pressure returned to baseline and stable Postop Assessment: no apparent nausea or vomiting Anesthetic complications: no   No notable events documented.   Last Vitals:  Vitals:   02/18/22 1345 02/18/22 1405  BP: (!) 96/54 112/63  Pulse: 70 63  Resp: 20   Temp: 36.8 C   SpO2: 100%     Last Pain:  Vitals:   02/19/22 0720  TempSrc:   PainSc: 0-No pain                 Molli Barrows

## 2022-02-20 NOTE — Progress Notes (Signed)
Remote pacemaker transmission.   

## 2022-03-11 DIAGNOSIS — Z86010 Personal history of colonic polyps: Secondary | ICD-10-CM | POA: Diagnosis not present

## 2022-03-11 DIAGNOSIS — I5022 Chronic systolic (congestive) heart failure: Secondary | ICD-10-CM | POA: Diagnosis not present

## 2022-03-11 DIAGNOSIS — K5790 Diverticulosis of intestine, part unspecified, without perforation or abscess without bleeding: Secondary | ICD-10-CM | POA: Diagnosis not present

## 2022-03-11 DIAGNOSIS — Z9889 Other specified postprocedural states: Secondary | ICD-10-CM | POA: Diagnosis not present

## 2022-03-11 DIAGNOSIS — K5909 Other constipation: Secondary | ICD-10-CM | POA: Diagnosis not present

## 2022-04-17 IMAGING — CR DG CHEST 2V
1 series · 2 of 2 positions shown · non-contrast
Comparison: 01/26/2018

CLINICAL DATA: Chest pain, nausea

EXAM:
CHEST - 2 VIEW

[Series 1: dg chest 2 view · 0.14mm/px · 2 of 2 slices shown]
[im 1/2]
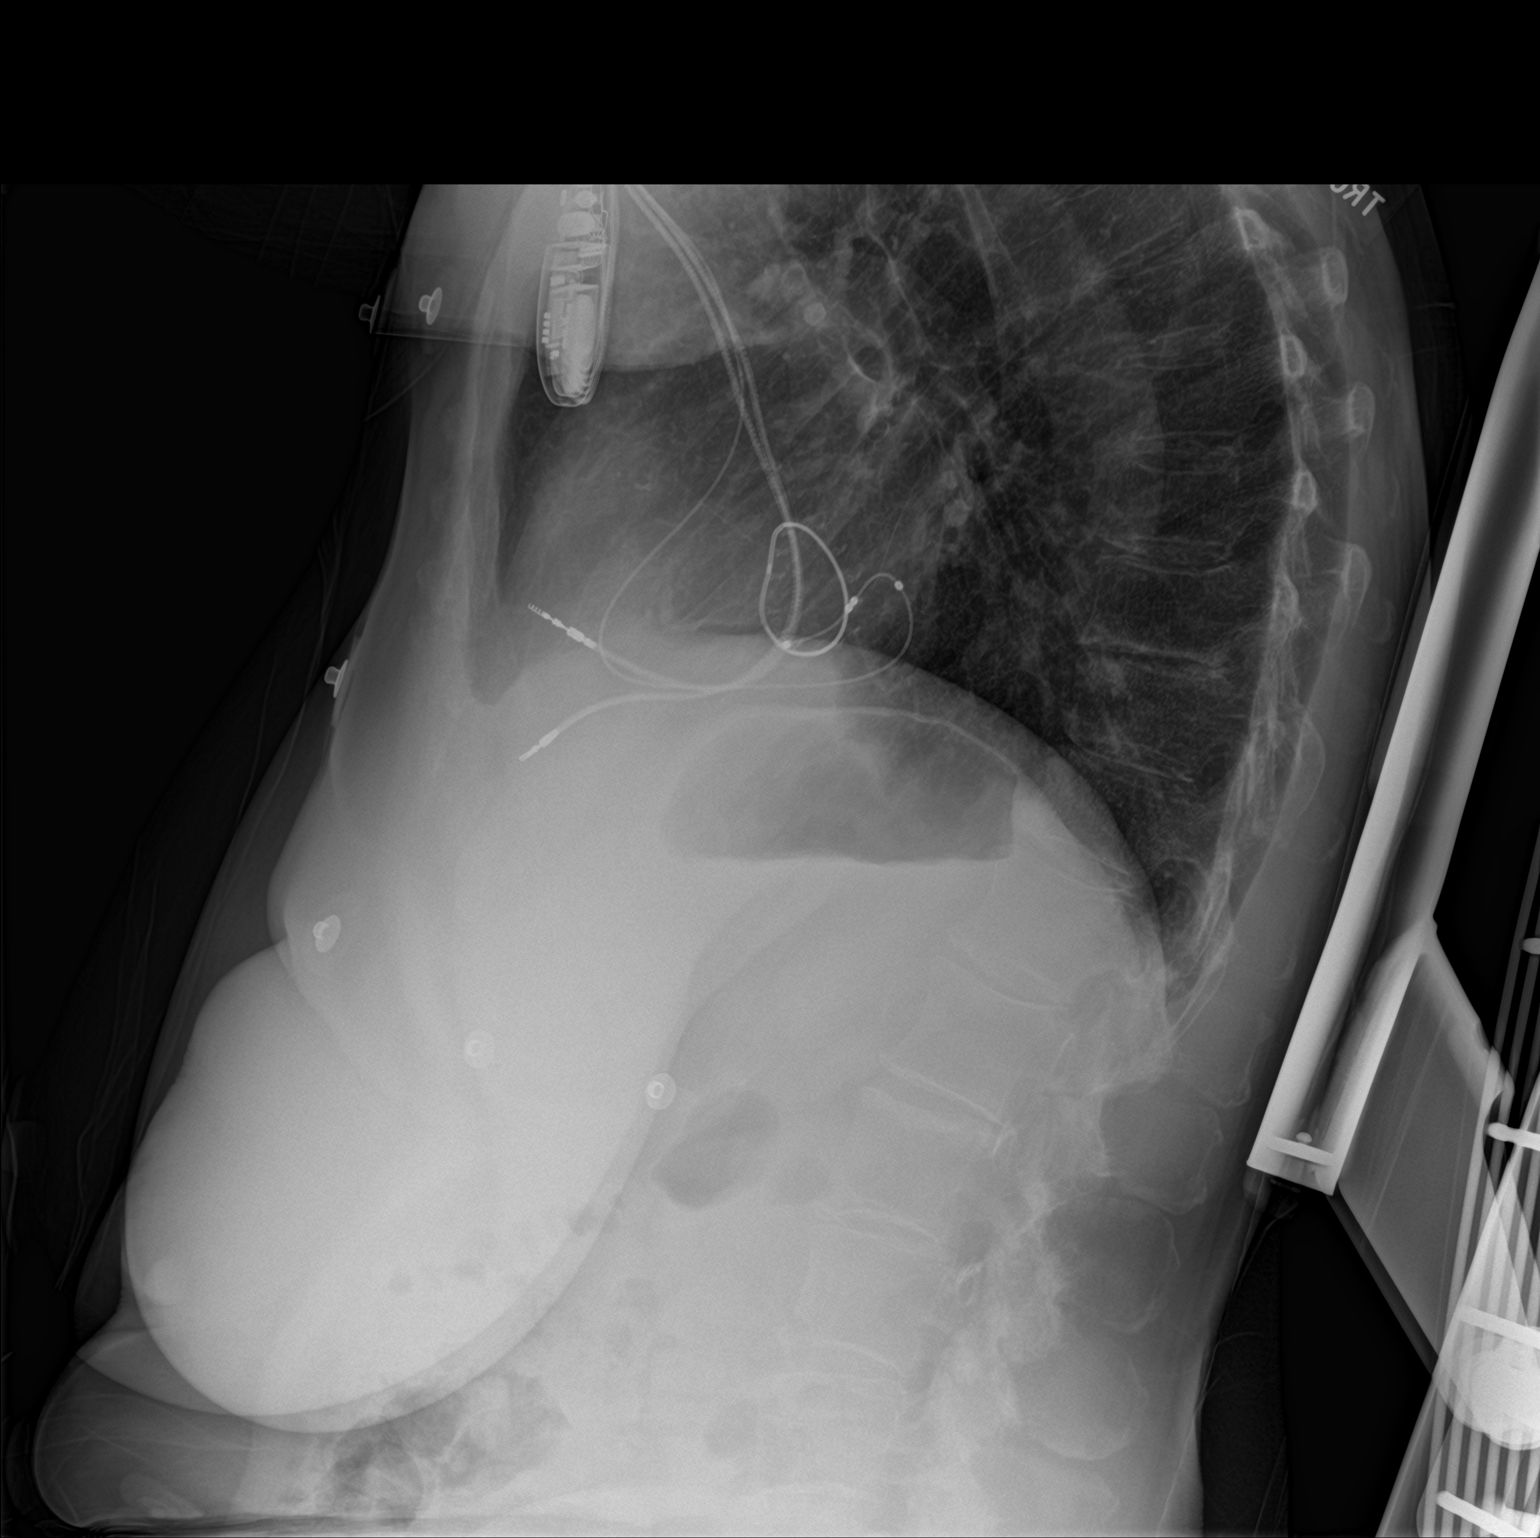
[im 2/2]
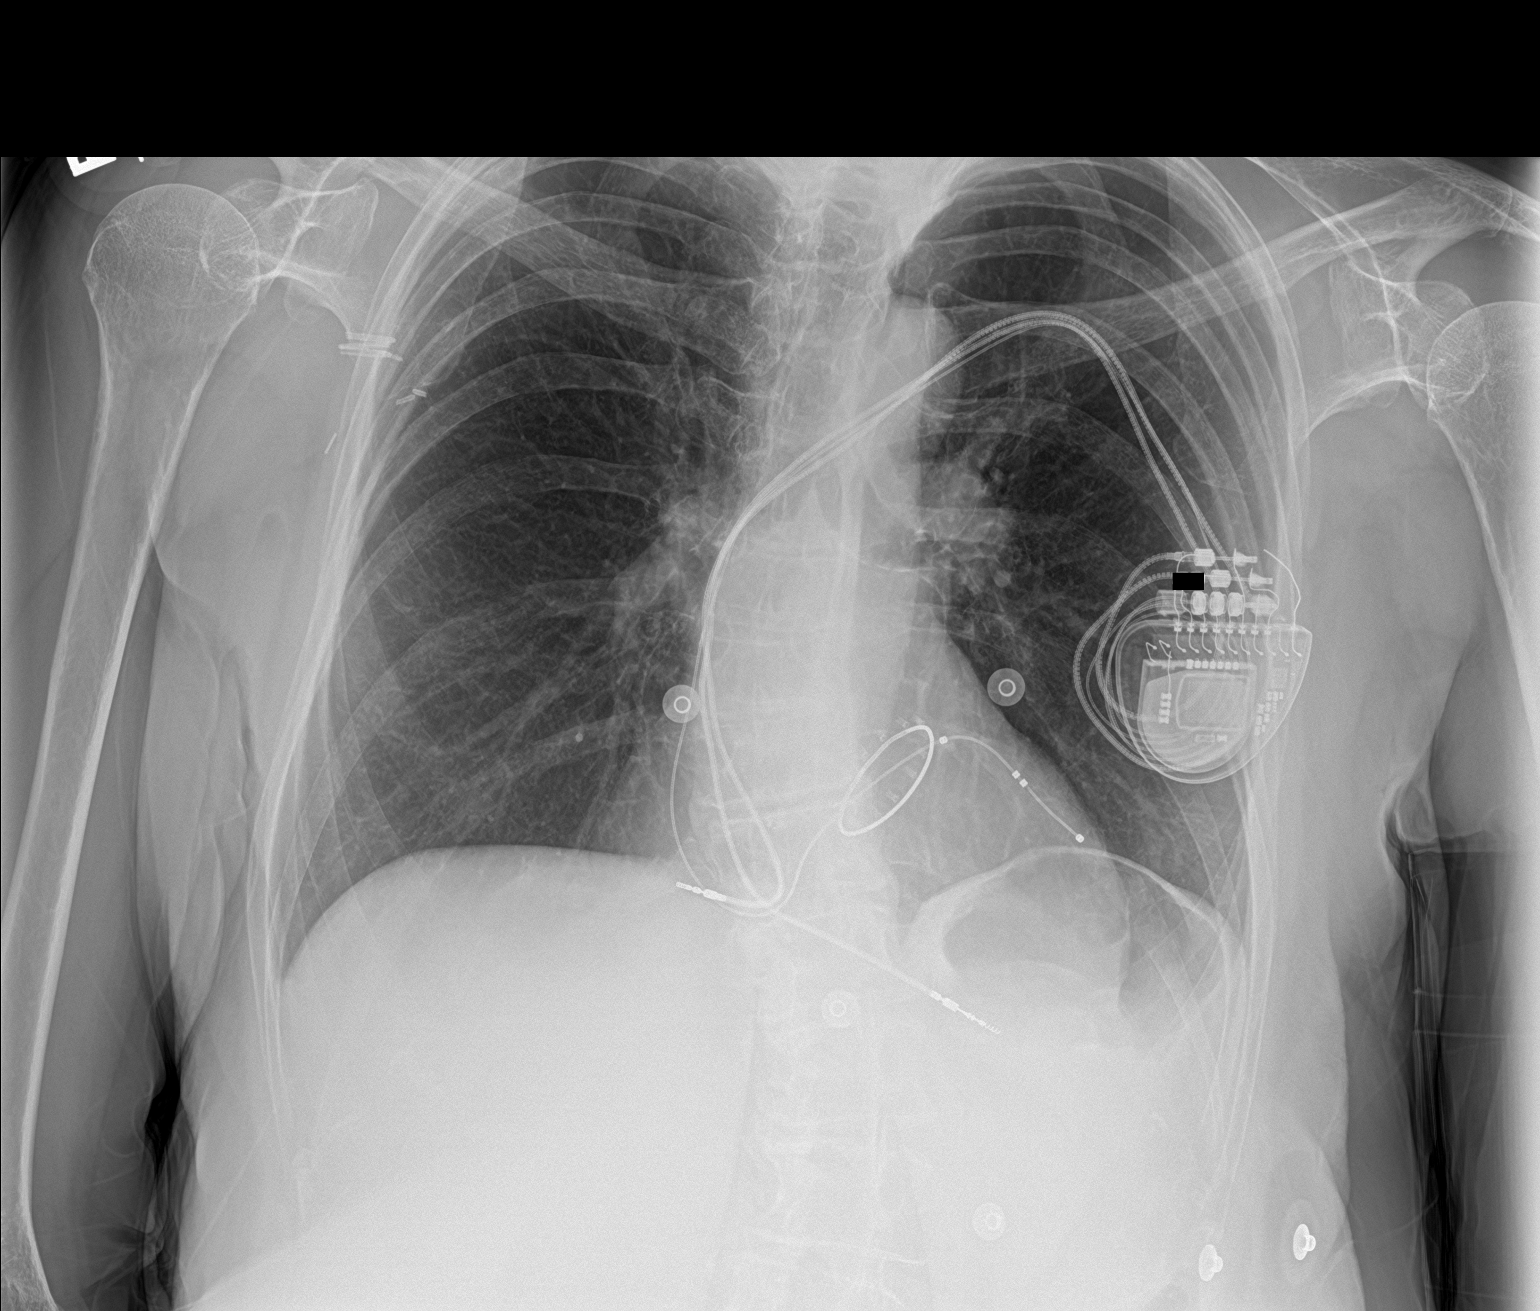

[2 of 2 positions shown; findings below may reference images not displayed]

FINDINGS: Frontal and lateral views of the chest demonstrate multi lead pacer,
with lead in the region of the right atrium, right ventricle, and
coronary sinus. Mitral valve annuloplasty ring is noted. The cardiac
silhouette is unremarkable. No airspace disease, effusion, or
pneumothorax. No acute bony abnormalities. Surgical clips right
axilla.
IMPRESSION: 1. No acute intrathoracic process.

## 2022-04-27 ENCOUNTER — Other Ambulatory Visit: Payer: Self-pay | Admitting: Internal Medicine

## 2022-04-29 ENCOUNTER — Encounter: Payer: Self-pay | Admitting: *Deleted

## 2022-05-04 DIAGNOSIS — Z03818 Encounter for observation for suspected exposure to other biological agents ruled out: Secondary | ICD-10-CM | POA: Diagnosis not present

## 2022-05-06 ENCOUNTER — Ambulatory Visit (INDEPENDENT_AMBULATORY_CARE_PROVIDER_SITE_OTHER): Payer: Medicare HMO | Admitting: Internal Medicine

## 2022-05-06 ENCOUNTER — Ambulatory Visit (INDEPENDENT_AMBULATORY_CARE_PROVIDER_SITE_OTHER): Payer: Medicare HMO

## 2022-05-06 ENCOUNTER — Encounter: Payer: Self-pay | Admitting: Internal Medicine

## 2022-05-06 VITALS — BP 120/76 | HR 64 | Ht 61.0 in | Wt 123.0 lb

## 2022-05-06 DIAGNOSIS — R000 Tachycardia, unspecified: Secondary | ICD-10-CM | POA: Diagnosis not present

## 2022-05-06 DIAGNOSIS — Z950 Presence of cardiac pacemaker: Secondary | ICD-10-CM

## 2022-05-06 DIAGNOSIS — I471 Supraventricular tachycardia: Secondary | ICD-10-CM

## 2022-05-06 DIAGNOSIS — I442 Atrioventricular block, complete: Secondary | ICD-10-CM

## 2022-05-06 DIAGNOSIS — I429 Cardiomyopathy, unspecified: Secondary | ICD-10-CM | POA: Diagnosis not present

## 2022-05-06 LAB — CUP PACEART REMOTE DEVICE CHECK
Battery Remaining Longevity: 124 mo
Battery Voltage: 3.01 V
Brady Statistic AP VP Percent: 0.76 %
Brady Statistic AP VS Percent: 16.47 %
Brady Statistic AS VP Percent: 0.06 %
Brady Statistic AS VS Percent: 82.7 %
Brady Statistic RA Percent Paced: 19.18 %
Brady Statistic RV Percent Paced: 0.82 %
Date Time Interrogation Session: 20230815012139
Implantable Lead Implant Date: 20190805
Implantable Lead Implant Date: 20190805
Implantable Lead Implant Date: 20190805
Implantable Lead Location: 753858
Implantable Lead Location: 753859
Implantable Lead Location: 753860
Implantable Lead Model: 4398
Implantable Lead Model: 5076
Implantable Lead Model: 5076
Implantable Lead Serial Number: 11111
Implantable Pulse Generator Implant Date: 20190805
Lead Channel Impedance Value: 1064 Ohm
Lead Channel Impedance Value: 285 Ohm
Lead Channel Impedance Value: 380 Ohm
Lead Channel Impedance Value: 399 Ohm
Lead Channel Impedance Value: 418 Ohm
Lead Channel Impedance Value: 475 Ohm
Lead Channel Impedance Value: 532 Ohm
Lead Channel Impedance Value: 589 Ohm
Lead Channel Impedance Value: 665 Ohm
Lead Channel Impedance Value: 798 Ohm
Lead Channel Impedance Value: 817 Ohm
Lead Channel Impedance Value: 836 Ohm
Lead Channel Impedance Value: 912 Ohm
Lead Channel Impedance Value: 988 Ohm
Lead Channel Pacing Threshold Amplitude: 0.625 V
Lead Channel Pacing Threshold Amplitude: 0.75 V
Lead Channel Pacing Threshold Amplitude: 1.125 V
Lead Channel Pacing Threshold Pulse Width: 0.4 ms
Lead Channel Pacing Threshold Pulse Width: 0.4 ms
Lead Channel Pacing Threshold Pulse Width: 0.4 ms
Lead Channel Sensing Intrinsic Amplitude: 1.625 mV
Lead Channel Sensing Intrinsic Amplitude: 1.625 mV
Lead Channel Sensing Intrinsic Amplitude: 11.5 mV
Lead Channel Sensing Intrinsic Amplitude: 11.5 mV
Lead Channel Setting Pacing Amplitude: 1.5 V
Lead Channel Setting Pacing Amplitude: 2 V
Lead Channel Setting Pacing Pulse Width: 0.4 ms
Lead Channel Setting Sensing Sensitivity: 2 mV

## 2022-05-06 NOTE — Progress Notes (Signed)
Patient ID: LEONE MOBLEY, female   DOB: 26-Jan-1951, 71 y.o.   MRN: [id number]      Patient Care Team: Juluis Pitch, MD as PCP - General (Family Medicine)   HPI  Doris [name] is a 71 y.o. 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  Switched off because of side effects.   Started on beta-blockers for blood pressure and tachycardia.  8/20 had started her on spironolactone; it was stopped 2/21 for reasons I cannot easily discern.  She does not recall ever having been on it   The patient denies chest pain, shortness of breath, nocturnal dyspnea, orthopnea or peripheral edema.  There have been no palpitations, lightheadedness or syncope.  Complains of hip pain whicih is limiting .    DATE TEST EF   6/19 Echo   50 % MR Mod-Severe  9/19 Echo   40 % MR none  6/20 Echo  35-40%   5/21 Echo ([place]) 40%   6/23 Echo ([place]) 35%      Date Cr K Hgb  8/20 0.73 4.3 14.4   01/22 0.63 4.1   3/23 0.8 4.6 13.2       Past Medical History:  Diagnosis Date   Cancer ([place])    Basal cell ( bridge of her nose)    Headache    Migraines   Hx of migraine headaches    Hypertension    Medical history non-contributory    Mitral valve prolapse at 19 yrs of age    Past Surgical History:  Procedure Laterality Date   CARDIAC CATHETERIZATION     CATARACT EXTRACTION  02/2017   COLONOSCOPY WITH PROPOFOL N/A 03/03/2017   Procedure: COLONOSCOPY WITH PROPOFOL;  Surgeon: Garlan Fair, MD;  Location: [facility];  Service: Endoscopy;  Laterality: N/A;   COLONOSCOPY WITH PROPOFOL N/A 02/18/2022   Procedure: COLONOSCOPY WITH PROPOFOL;  Surgeon: Lesly Rubenstein, MD;  Location: [facility] ENDOSCOPY;  Service: Endoscopy;  Laterality: N/A;   OOPHORECTOMY  2005    Current Meds  Medication Sig   b complex vitamins tablet Take 0.5  tablets by mouth daily.   bisoprolol (ZEBETA) 5 MG tablet TAKE 1/2 TABLET BY MOUTH EVERY DAY   Cholecalciferol (VITAMIN D3) LIQD Take 1 drop by mouth daily.    Krill Oil 350 MG CAPS Take 350 mg by mouth.   Multiple Vitamins-Minerals (ICAPS) CAPS Take by mouth daily.    Polyethyl Glycol-Propyl Glycol (SYSTANE ULTRA OP) Place 1 drop into the right eye every 2 (two) hours as needed (dry eyes).   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- she was 73 years old Has patient had a PCN reaction causing immediate rash, facial/tongue/throat swelling, SOB or lightheadedness with hypotension: Unknown Has  patient had a PCN reaction causing severe rash involving mucus membranes or skin necrosis: Unknown Has patient had a PCN reaction that required hospitalization: Yes Has patient had a PCN reaction occurring within the last 10 years: No If all of the above answers are "NO", then may proceed with Cephalos      Review of Systems negative except from HPI and PMH  Physical Exam BP 120/76 (BP Location: Left Arm, Patient Position: Sitting, Cuff Size: Normal)   Pulse 64   Ht '5\' 1"'$  (1.549 m)   Wt 123 lb (55.8 kg)   SpO2 98%   BMI 23.24 kg/m  Well developed and well nourished in no acute distress HENT normal Neck supple with JVP-flat Clear Device pocket well healed; without hematoma or erythema.  There is no tethering  Regular rate  and rhythm, no  gallop No  murmur Abd-soft with active BS No Clubbing cyanosis  edema Skin-warm and dry A & Oriented  Grossly normal sensory and motor function  ECG sinus @ 64 15/07/40    Assessment and  Plan  Sinus tachycardia  Can not exclude atrial  Mitral valve repair  Complete heart block-transient  VT- nonsustained   Junctional tachycardia  CRT-P-Medtronic  Cardio myopathy-mod--nonischemic   Alopecia  ACE/ARB intolerance  Euvolemic.  We will continue oral Aldactone 12.5.  For her cardiomyopathy continue bisoprolol.  She is having alopecia, when asked, she is inclined to continue her beta-blocker given its cardiac benefits.  Spironolactone if anything is associated with hair growth given its androgenic effects  We discussed the use of an SGLT2 given her reduced ejection fraction.  Not withstanding it albeit modest benefit of all cause mortality.  In this regard also, given her borderline ejection fraction borderline age nonischemic myopathy after further discussion regarding ICD implantation.  No significant interval atrial fibrillation greater then 10 minutes  No ventricular tachycardia identified

## 2022-05-06 NOTE — Patient Instructions (Signed)
Medication Instructions:  Your physician recommends that you continue on your current medications as directed. Please refer to the Current Medication list given to you today.  *If you need a refill on your cardiac medications before your next appointment, please call your pharmacy*   Lab Work: None ordered If you have labs (blood work) drawn today and your tests are completely normal, you will receive your results only by: [place] (if you have MyChart) OR A paper copy in the mail If you have any lab test that is abnormal or we need to change your treatment, we will call you to review the results.   Testing/Procedures: None ordered   Follow-Up: At [facility], you and your health needs are our priority.  As part of our continuing mission to provide you with exceptional heart care, we have created designated Provider Care Teams.  These Care Teams include your primary Cardiologist (physician) and Advanced Practice Providers (APPs -  Physician Assistants and Nurse Practitioners) who all work together to provide you with the care you need, when you need it.  We recommend signing up for the patient portal called "MyChart".  Sign up information is provided on this After Visit Summary.  MyChart is used to connect with patients for Virtual Visits (Telemedicine).  Patients are able to view lab/test results, encounter notes, upcoming appointments, etc.  Non-urgent messages can be sent to your provider as well.   To learn more about what you can do with MyChart, go to [email or web address].    Your next appointment:   Your physician wants you to follow-up in: 1 year You will receive a reminder letter in the mail two months in advance. If you don't receive a letter, please call our office to schedule the follow-up appointment.   The format for your next appointment:   In Person  Provider:   Virl Axe, MD{    Other Instructions N/A  Important Information About  [place]

## 2022-05-07 ENCOUNTER — Telehealth: Payer: Self-pay | Admitting: Internal Medicine

## 2022-05-07 NOTE — Telephone Encounter (Signed)
Pt states that at her last appt 08/15 with Dr. Caryl Comes he suggested she start the medication Jardiance, but she declined. She has since changed her mind and would like to give this medication a try. Requesting a call back to get this medication started.

## 2022-05-08 MED ORDER — EMPAGLIFLOZIN 10 MG PO TABS: 10.0000 mg | ORAL_TABLET | Freq: Every day | ORAL | 6 refills | Status: AC

## 2022-05-08 NOTE — Telephone Encounter (Signed)
Reviewed with Dr. Caryl Comes.  Recommendations are to: - start Jardiance 10 mg: take 0.5 tablet (5 mg) x 1 week - if after 1 week she is feeling ok, then increase to 1 tablet (10 mg) once daily    I have called and notified the patient of the above MD recommendations. She voices understanding and is agreeable.

## 2022-05-09 ENCOUNTER — Other Ambulatory Visit: Payer: Self-pay | Admitting: Internal Medicine

## 2022-05-09 DIAGNOSIS — I429 Cardiomyopathy, unspecified: Secondary | ICD-10-CM

## 2022-05-12 DIAGNOSIS — M25552 Pain in left hip: Secondary | ICD-10-CM | POA: Diagnosis not present

## 2022-05-12 DIAGNOSIS — F411 Generalized anxiety disorder: Secondary | ICD-10-CM | POA: Diagnosis not present

## 2022-05-12 DIAGNOSIS — R413 Other amnesia: Secondary | ICD-10-CM | POA: Diagnosis not present

## 2022-05-12 DIAGNOSIS — G8929 Other chronic pain: Secondary | ICD-10-CM | POA: Diagnosis not present

## 2022-05-30 DIAGNOSIS — R102 Pelvic and perineal pain: Secondary | ICD-10-CM | POA: Diagnosis not present

## 2022-06-02 ENCOUNTER — Ambulatory Visit (INDEPENDENT_AMBULATORY_CARE_PROVIDER_SITE_OTHER): Payer: Medicare HMO

## 2022-06-02 ENCOUNTER — Ambulatory Visit: Payer: Medicare HMO | Admitting: Orthopaedic Surgery

## 2022-06-02 DIAGNOSIS — M25551 Pain in right hip: Secondary | ICD-10-CM

## 2022-06-02 DIAGNOSIS — G8929 Other chronic pain: Secondary | ICD-10-CM | POA: Diagnosis not present

## 2022-06-02 DIAGNOSIS — M5441 Lumbago with sciatica, right side: Secondary | ICD-10-CM

## 2022-06-02 NOTE — Progress Notes (Signed)
The patient is an active 71 year old that I am seeing for the first time today.  She comes in with a history of right hip pain but she points closer to her rectum and the lower pelvis as a source of her pain.  She denies any groin pain.  She reports an injury back in 2016 where she was lifting something heavy.  Its been slowly worsening with time.  She has known scoliosis of the lumbar spine.  She does have a pacemaker as well.  She does occasionally get low back pain and some radicular symptoms down her right leg.  On exam she walks without a limp.  She does have obvious thoracolumbar scoliosis.  Her hips move smoothly and seem congruent with no pain in the groin with examination of either hip.  Basically there is no pain over the ischial area or the lateral aspect of the right hip.  The pain seems to be more deep into the pelvis.  She does have right-sided low back pain and SI joint pain as well.  X-rays of the pelvis and right hip show no acute findings.  The hip joint spaces well-maintained.  I see no cortical irregularities around the ischium on the right side or the SI joint.  At this point I would like to send her for an MRI of her lumbar spine to rule out nerve compression that is affecting the right side of her pelvis given the pain she is having.  I gave her reassurance that this is not a hip issue from my standpoint but we need to diagnose her pain.  She has been through therapy as well.  All questions and concerns were answered and addressed.  She agrees with this treatment plan.

## 2022-06-03 ENCOUNTER — Other Ambulatory Visit: Payer: Self-pay

## 2022-06-03 DIAGNOSIS — G8929 Other chronic pain: Secondary | ICD-10-CM

## 2022-06-06 ENCOUNTER — Telehealth: Payer: Self-pay | Admitting: Internal Medicine

## 2022-06-06 NOTE — Telephone Encounter (Signed)
Note Reviewed with Dr. Caryl Comes.   Recommendations are to: - start Jardiance 10 mg: take 0.5 tablet (5 mg) x 1 week - if after 1 week she is feeling ok, then increase to 1 tablet (10 mg) once daily            I spoke with patient and she is doing well.She is glad she decided to take Jardiance.

## 2022-06-06 NOTE — Telephone Encounter (Signed)
Pt c/o medication issue:  1. Name of Medication:   empagliflozin (JARDIANCE) 10 MG TABS tablet    2. How are you currently taking this medication (dosage and times per day)?  Take 1 tablet (10 mg total) by mouth daily before breakfast 3. Are you having a reaction (difficulty breathing--STAT)? No  4. What is your medication issue? Pt wanted to make provider aware that she has now been taking medication for a week. On the the first 2 days she only took 1/2 a dose . Please advise

## 2022-06-07 ENCOUNTER — Other Ambulatory Visit: Payer: Self-pay | Admitting: Internal Medicine

## 2022-06-09 NOTE — Progress Notes (Signed)
Remote pacemaker transmission.   

## 2022-06-16 DIAGNOSIS — R102 Pelvic and perineal pain: Secondary | ICD-10-CM | POA: Diagnosis not present

## 2022-06-16 DIAGNOSIS — M7918 Myalgia, other site: Secondary | ICD-10-CM | POA: Diagnosis not present

## 2022-07-22 ENCOUNTER — Encounter: Payer: Self-pay | Admitting: Internal Medicine

## 2022-07-22 LAB — PACEMAKER DEVICE OBSERVATION

## 2022-07-29 DIAGNOSIS — G8929 Other chronic pain: Secondary | ICD-10-CM | POA: Diagnosis not present

## 2022-07-29 DIAGNOSIS — R413 Other amnesia: Secondary | ICD-10-CM | POA: Diagnosis not present

## 2022-07-29 DIAGNOSIS — F411 Generalized anxiety disorder: Secondary | ICD-10-CM | POA: Diagnosis not present

## 2022-07-29 DIAGNOSIS — M25552 Pain in left hip: Secondary | ICD-10-CM | POA: Diagnosis not present

## 2022-07-31 ENCOUNTER — Ambulatory Visit: Payer: Medicare HMO | Attending: Obstetrics and Gynecology | Admitting: Physical Therapy

## 2022-07-31 ENCOUNTER — Encounter: Payer: Self-pay | Admitting: Physical Therapy

## 2022-07-31 DIAGNOSIS — R278 Other lack of coordination: Secondary | ICD-10-CM | POA: Diagnosis not present

## 2022-07-31 DIAGNOSIS — R2689 Other abnormalities of gait and mobility: Secondary | ICD-10-CM | POA: Insufficient documentation

## 2022-07-31 DIAGNOSIS — M4125 Other idiopathic scoliosis, thoracolumbar region: Secondary | ICD-10-CM | POA: Diagnosis not present

## 2022-07-31 NOTE — Patient Instructions (Signed)
  Lengthen Back rib by L  shoulder ( winging)    Lie on R  side , pillow between knees and under head  Pull  L arm overhead over mattress, grab the edge of mattress,pull it upward, drawing elbow away from ears  Breathing 10 reps  Brushing arm with 3/4 turn onto pillow behind back  Lying on R  side ,Pillow/ Block between knees     dragging top forearm across ribs below breast rotating 3/4 turn,  rotating  _L_ only this week ,  relax onto the pillow behind the back  and then back to other palm , maintain top palm on body whole top and not lift shoulder  ___ 

## 2022-07-31 NOTE — Therapy (Signed)
OUTPATIENT PHYSICAL THERAPY EVALUATION   Patient Name: RHIAN ASEBEDO MRN: [id number] DOB:24-Feb-1951, 71 y.o., female Today's Date: 07/31/2022   PT End of Session - 07/31/22 1023     Visit Number 1    Number of Visits 10    PT Start Time 1018    PT Stop Time 1100    PT Time Calculation (min) 42 min    Activity Tolerance Patient tolerated treatment well    Behavior During Therapy WFL for tasks assessed/performed             Past Medical History:  Diagnosis Date   A-fib ([place])    Cancer ([place])    Basal cell ( bridge of her nose)    CHF (congestive heart failure) (HCC)    Headache    Migraines   Hx of migraine headaches    Hypertension    Medical history non-contributory    Mitral valve prolapse at 19 yrs of age   Past Surgical History:  Procedure Laterality Date   CARDIAC CATHETERIZATION     CATARACT EXTRACTION  02/2017   COLONOSCOPY WITH PROPOFOL N/A 03/03/2017   Procedure: COLONOSCOPY WITH PROPOFOL;  Surgeon: Garlan Fair, MD;  Location: [facility];  Service: Endoscopy;  Laterality: N/A;   COLONOSCOPY WITH PROPOFOL N/A 02/18/2022   Procedure: COLONOSCOPY WITH PROPOFOL;  Surgeon: Lesly Rubenstein, MD;  Location: [facility];  Service: Endoscopy;  Laterality: N/A;   OOPHORECTOMY  2005   Patient Active Problem List   Diagnosis Date Noted   Painful veins 08/07/2018    PCP: Lovie Macadamia MD    REFERRING PROVIDER:Schermerhorn MD  REFERRING DIAG: Pelvic Pain  Rationale for Evaluation and Treatment Rehabilitation  THERAPY DIAG:  Other idiopathic scoliosis, thoracolumbar region  Other abnormalities of gait and mobility  Other lack of coordination  ONSET DATE: 6+ months ago  SUBJECTIVE:                                                                                                                                                                                           SUBJECTIVE STATEMENT:            Sister present and answered some questions for  pt.   1) R posterior glut pain started 6 + months ago. Driving stickshift , sitting on non-cushion chairs caused pain. Pain radiates to back of thigh started more recently. Pt walks 4000 steps  in her yard but feels the pain coming on at 2000 steps . Pt used to do 8000 steps prior to this pain.  o 3/10. Currently sitting on plinth 5/10.   Pain 6/10 2) sometimes pain at  R by ASIS : 4-5/10 , pt thinks it is related to bowel movements. Pt has Type 1 , 3 stool consistency.  Drinks 3-4 glasses a day. 2 glasses juice, herbal tea, coffee.    PERTINENT HISTORY:  Scoliosis, CA skin, A-fib/ CHF, tachycardia , pacemaker ( total  7 different heart conditions) , recent dementia Dx, twin has Alzheimers   PAIN:  Are you having pain? See above  PRECAUTIONS: No  WEIGHT BEARING RESTRICTIONS: No  FALLS:  Has patient fallen in last 6 months? Yes, fell on the grass   LIVING ENVIRONMENT: Lives with: lives alone  Lives in: House/apartment Stairs: Yes STE 3 with rail only in the back  Has following equipment at home: No   OCCUPATION: retired Pharmacist, hospital , hobbies: gardening.   PLOF: Independent  PATIENT GOALS:   Not have pain, walk without pain    OBJECTIVE:    [facility] PT Assessment - 07/31/22 1041       Observation/Other Assessments   Scoliosis ( scoliosis curve: flank space: L 2 fingers, R 4 fingers width between low rib/ iliac crest.) ( thoracic kyphosis/ forward head : 21 cm from earlobe to wall )      Strength   Overall Strength Comments R LE 4/5, L 55      Ambulation/Gait   Gait Comments pre Tx: 1.15 m/s, limited trunk rotation, decreased stance on L, thorax shifted to L) ,             [facility] Adult PT Treatment/Exercise - 08/01/22 1211       Ambulation/Gait   Gait Comments pre Tx: 1.15 m/s, limited trunk rotation, decreased stance on L, thorax shifted to L) ,      Therapeutic Activites    Therapeutic Activities Other Therapeutic Activities    Other Therapeutic Activities explained  role of scoliosis in pelvic pain      Neuro Re-ed    Neuro Re-ed Details  cued  pt and sister on HEP to address scoliosis               HOME EXERCISE PROGRAM: See pt instruction section    ASSESSMENT:  CLINICAL IMPRESSION:    Pt is a  71  yo who presents with  R posterior glut pain and   R by ASIS  which impact QOL, ADL, and community activities.   Pt's musculoskeletal assessment revealed scoliosis-related deficits : uneven pelvic girdle and shoulder height, asymmetries to gait pattern, limited spinal /pelvic mobility, dyscoordination and strength of pelvic floor mm, hip weakness, poor body mechanics which places strain on the abdominal/pelvic floor mm.   Pt was provided education on etiology of Sx with anatomy, physiology explanation with images along with the benefits of customized pelvic PT Tx based on pt's medical conditions and musculoskeletal deficits.  Explained the physiology of deep core mm coordination and roles of pelvic floor function in urination, defecation, sexual function, and postural control with deep core mm system.   Following Tx today which pt tolerated without complaints, pt demo'd a more medially shifted thorax.  Pt and sister were educated on HEP. Pt required cues for HEP with more simple instructions. Plan to utilize video recording to yield compliance. Plan to continue with scoliosis-specific HEP at next session.   Pt benefits from skilled PT.    OBJECTIVE IMPAIRMENTS decreased activity tolerance, decreased coordination, decreased endurance, decreased mobility, difficulty walking, decreased ROM, decreased strength, decreased safety awareness, hypomobility, increased muscle spasms, impaired flexibility, improper body mechanics, postural dysfunction, and pain.  ACTIVITY LIMITATIONS  self-care,  sleep, home chores, work tasks    PARTICIPATION LIMITATIONS:  community, gardening    PERSONAL FACTORS   scoliosis, recent dementia Dx, lives alone are factors  also affecting patient's functional outcome.    REHAB POTENTIAL: Good   CLINICAL DECISION MAKING: Evolving/moderate complexity   EVALUATION COMPLEXITY: Moderate    PATIENT EDUCATION:    Education details: Showed pt anatomy images. Explained muscles attachments/ connection, physiology of deep core system/ spinal- thoracic-pelvis-lower kinetic chain as they relate to pt's presentation, Sx, and past Hx. Explained what and how these areas of deficits need to be restored to balance and function    See Therapeutic activity / neuromuscular re-education section  Answered pt's questions.   Person educated: Patient Education method: Explanation, Demonstration, Tactile cues, Verbal cues, and Handouts Education comprehension: verbalized understanding, returned demonstration, verbal cues required, tactile cues required, and needs further education     PLAN: PT FREQUENCY: 1x/week   PT DURATION: 10 weeks   PLANNED INTERVENTIONS: Therapeutic exercises, Therapeutic activity, Neuromuscular re-education, Balance training, Gait training, Patient/Family education, Self Care, Joint mobilization, Spinal mobilization, Moist heat, Taping, and Manual therapy, dry needling.   PLAN FOR NEXT SESSION: See clinical impression for plan     GOALS: Goals reviewed with patient? Yes  SHORT TERM GOALS: Target date: 08/29/2022    Pt will demo IND with HEP                    Baseline: Not IND            Goal status: INITIAL   LONG TERM GOALS: Target date: 10/10/2022   1.Pt will demo proper deep core coordination without chest breathing and optimal excursion of diaphragm/pelvic floor in order to promote spinal stability and pelvic floor function  Baseline: dyscoordination Goal status: INITIAL  2.  Pt will demo > 5 pt change on FOTO  to improve QOL and function   Pelvic Pain baseline -33  Bowel  constipation baseline -68 Bowel Leakage baseline -69    Goal status: INITIAL  3.  Pt will demo proper  body mechanics in against gravity tasks and ADLs  work tasks, fitness  to minimize straining pelvic floor / back                  Baseline: not IND, improper form that places strain on pelvic floor                Goal status: INITIAL    4. Pt will walk > 3000 steps before the onset of pain in order to maintain mobility   Baseline: Pt walks 4000 steps  in her yard but feels the pain coming on at 2000 steps  Goal status: INITIAL    5. Pt will be able drive and sit on non-soft cushions for > 30 min with 50% less glut pain in order to participate in community activities  Baseline:  driving and sitting on non-soft cushions caused R glut pain  Goal status: INITIAL   6. Pt will demo increased gait speed > 1.3 m/s in order to ambulate safely in community with decreased risk for falls   Baseline: 1.15 m/s, limited trunk rotation, decreased stance on L, thorax shifted to L) ,  Goal status: INITIAL                         7. Pt will demo levelled pelvic girdle or flank space B with  equal fingers width to indicate thoracic shift to medial line and pt and sister will be IND with scoliosis -specific HEP in order to restore mobility at spine, pelvis, gait, posture  Baseline: scoliosis curve: flank space: L 2 fingers, R 4 fingers width between low rib/ iliac crest.) ( thoracic kyphosis/ forward head : 21 cm from earlobe to wall )    Jerl Mina, PT 07/31/2022, 10:27 AM

## 2022-08-03 ENCOUNTER — Other Ambulatory Visit: Payer: Self-pay | Admitting: Internal Medicine

## 2022-08-05 ENCOUNTER — Ambulatory Visit (INDEPENDENT_AMBULATORY_CARE_PROVIDER_SITE_OTHER): Payer: Medicare HMO

## 2022-08-05 DIAGNOSIS — I442 Atrioventricular block, complete: Secondary | ICD-10-CM

## 2022-08-05 LAB — CUP PACEART REMOTE DEVICE CHECK
Battery Remaining Longevity: 122 mo
Battery Voltage: 3.01 V
Brady Statistic AP VP Percent: 0.33 %
Brady Statistic AP VS Percent: 23.2 %
Brady Statistic AS VP Percent: 0.03 %
Brady Statistic AS VS Percent: 76.44 %
Brady Statistic RA Percent Paced: 26.44 %
Brady Statistic RV Percent Paced: 0.36 %
Date Time Interrogation Session: 20231114002419
Implantable Lead Connection Status: 753985
Implantable Lead Connection Status: 753985
Implantable Lead Connection Status: 753985
Implantable Lead Implant Date: 20190805
Implantable Lead Implant Date: 20190805
Implantable Lead Implant Date: 20190805
Implantable Lead Location: 753858
Implantable Lead Location: 753859
Implantable Lead Location: 753860
Implantable Lead Model: 4398
Implantable Lead Model: 5076
Implantable Lead Model: 5076
Implantable Lead Serial Number: 11111
Implantable Pulse Generator Implant Date: 20190805
Lead Channel Impedance Value: 1045 Ohm
Lead Channel Impedance Value: 1121 Ohm
Lead Channel Impedance Value: 304 Ohm
Lead Channel Impedance Value: 418 Ohm
Lead Channel Impedance Value: 437 Ohm
Lead Channel Impedance Value: 437 Ohm
Lead Channel Impedance Value: 513 Ohm
Lead Channel Impedance Value: 608 Ohm
Lead Channel Impedance Value: 627 Ohm
Lead Channel Impedance Value: 684 Ohm
Lead Channel Impedance Value: 836 Ohm
Lead Channel Impedance Value: 893 Ohm
Lead Channel Impedance Value: 912 Ohm
Lead Channel Impedance Value: 969 Ohm
Lead Channel Pacing Threshold Amplitude: 0.5 V
Lead Channel Pacing Threshold Amplitude: 0.75 V
Lead Channel Pacing Threshold Amplitude: 1.125 V
Lead Channel Pacing Threshold Pulse Width: 0.4 ms
Lead Channel Pacing Threshold Pulse Width: 0.4 ms
Lead Channel Pacing Threshold Pulse Width: 0.4 ms
Lead Channel Sensing Intrinsic Amplitude: 1.25 mV
Lead Channel Sensing Intrinsic Amplitude: 1.25 mV
Lead Channel Sensing Intrinsic Amplitude: 13 mV
Lead Channel Sensing Intrinsic Amplitude: 13 mV
Lead Channel Setting Pacing Amplitude: 1.5 V
Lead Channel Setting Pacing Amplitude: 2 V
Lead Channel Setting Pacing Pulse Width: 0.4 ms
Lead Channel Setting Sensing Sensitivity: 2 mV
Zone Setting Status: 755011
Zone Setting Status: 755011

## 2022-08-07 ENCOUNTER — Ambulatory Visit: Payer: Medicare HMO | Admitting: Physical Therapy

## 2022-08-07 DIAGNOSIS — R2689 Other abnormalities of gait and mobility: Secondary | ICD-10-CM

## 2022-08-07 DIAGNOSIS — R278 Other lack of coordination: Secondary | ICD-10-CM

## 2022-08-07 DIAGNOSIS — M4125 Other idiopathic scoliosis, thoracolumbar region: Secondary | ICD-10-CM | POA: Diagnosis not present

## 2022-08-07 NOTE — Therapy (Signed)
OUTPATIENT PHYSICAL THERAPY Treatment    Patient Name: Doris [name] MRN: [id number] DOB:1951-01-27, 71 y.o., female Today's Date: 08/07/2022   PT End of Session - 08/07/22 1025     Visit Number 2    Number of Visits 10    PT Start Time 1020    PT Stop Time 1105    PT Time Calculation (min) 45 min    Activity Tolerance Patient tolerated treatment well    Behavior During Therapy WFL for tasks assessed/performed             Past Medical History:  Diagnosis Date   A-fib ([place])    Cancer ([place])    Basal cell ( bridge of her nose)    CHF (congestive heart failure) (HCC)    Headache    Migraines   Hx of migraine headaches    Hypertension    Medical history non-contributory    Mitral valve prolapse at 19 yrs of age   Past Surgical History:  Procedure Laterality Date   CARDIAC CATHETERIZATION     CATARACT EXTRACTION  02/2017   COLONOSCOPY WITH PROPOFOL N/A 03/03/2017   Procedure: COLONOSCOPY WITH PROPOFOL;  Surgeon: Garlan Fair, MD;  Location: [facility];  Service: Endoscopy;  Laterality: N/A;   COLONOSCOPY WITH PROPOFOL N/A 02/18/2022   Procedure: COLONOSCOPY WITH PROPOFOL;  Surgeon: Lesly Rubenstein, MD;  Location: [facility];  Service: Endoscopy;  Laterality: N/A;   OOPHORECTOMY  2005   Patient Active Problem List   Diagnosis Date Noted   Painful veins 08/07/2018    PCP: Lovie Macadamia MD    REFERRING PROVIDER:Schermerhorn MD  REFERRING DIAG: Pelvic Pain  Rationale for Evaluation and Treatment Rehabilitation  THERAPY DIAG:  Other idiopathic scoliosis, thoracolumbar region  Other abnormalities of gait and mobility  Other lack of coordination  ONSET DATE: 6+ months ago  SUBJECTIVE:                       SUBJECTIVE TODAY  Pt reports 65% improved on LBP, Pelvic pain is improved by 20%   Sitting is still painful                                                                                                                                                                         SUBJECTIVE STATEMENT EVAl on 07/31/22             Sister present and answered some questions for pt.   1) R posterior glut pain started 6 + months ago. Driving stickshift , sitting on non-cushion chairs caused pain. Pain radiates to back of thigh started more recently. Pt walks 4000 steps  in her yard but feels the pain coming on at  2000 steps . Pt used to do 8000 steps prior to this pain.  o 3/10. Currently sitting on plinth 5/10.   Pain 6/10 2) sometimes pain at R by ASIS : 4-5/10 , pt thinks it is related to bowel movements. Pt has Type 1 , 3 stool consistency.  Drinks 3-4 glasses a day. 2 glasses juice, herbal tea, coffee.    PERTINENT HISTORY:  Scoliosis, CA skin, A-fib/ CHF, tachycardia , pacemaker ( total  7 different heart conditions) , recent dementia Dx, twin has Alzheimers   PAIN:  Are you having pain? See above  PRECAUTIONS: No  WEIGHT BEARING RESTRICTIONS: No  FALLS:  Has patient fallen in last 6 months? Yes, fell on the grass   LIVING ENVIRONMENT: Lives with: lives alone  Lives in: House/apartment Stairs: Yes STE 3 with rail only in the back  Has following equipment at home: No   OCCUPATION: retired Pharmacist, hospital , hobbies: gardening.   PLOF: Independent  PATIENT GOALS:   Not have pain, walk without pain    OBJECTIVE:   [facility] PT Assessment - 08/07/22 1028       Observation/Other Assessments   Scoliosis L trunk lean, L thoracic curve/kyphosis posteriorly rotated      Palpation   Spinal mobility tightness along paraspinal/ intercostals/interspinal L    SI assessment  iliac crest levelled       Bed Mobility   Bed Mobility --   crunch method              [facility] Adult PT Treatment/Exercise - 08/07/22 1406       Therapeutic Activites    Other Therapeutic Activities explained progression of PT to address LBP first , get thorax medially shifted, then pelvic pain will have more resolve, Cued for logrolling      Neuro  Re-ed    Neuro Re-ed Details  cued for HEP ( recorded on sister's phone)      Modalities   Modalities Moist Heat      Moist Heat Therapy   Number Minutes Moist Heat 5 Minutes    Moist Heat Location --   thoracic ( unbilled)     Manual Therapy   Manual therapy comments STM/MWM at L intercostals/ paraspinals to minimize kyhophysis              HOME EXERCISE PROGRAM: See pt instruction section    ASSESSMENT:  CLINICAL IMPRESSION:    Modified scoliosis-specific HEP to seated position to promote more compliance for pt. Pt demo'd form correctly with cues. HEP was recorded on sister's phone to help with compliance.  Pt demo'd less posterior rotation of scoliosis on L thorax after manual Tx and new seated HEP.   Pt reported 80% improved LBP and 20% improved with pelvic pain post Tx.   Pt required cues with log rolling instead of crunch method which is going to help minimize risk for osteoporosis/ Fx given pt's thoracic kyphosis. Plan to reassess body mechanics to optimize postural stability.  Plan to apply more manual Tx to address scoliosis.   Sister was present today.    Pt benefits from skilled PT.    OBJECTIVE IMPAIRMENTS decreased activity tolerance, decreased coordination, decreased endurance, decreased mobility, difficulty walking, decreased ROM, decreased strength, decreased safety awareness, hypomobility, increased muscle spasms, impaired flexibility, improper body mechanics, postural dysfunction, and pain.    ACTIVITY LIMITATIONS  self-care,  sleep, home chores, work tasks    PARTICIPATION LIMITATIONS:  community, gardening    [place]  scoliosis, recent dementia Dx, lives alone are factors also affecting patient's functional outcome.    REHAB POTENTIAL: Good   CLINICAL DECISION MAKING: Evolving/moderate complexity   EVALUATION COMPLEXITY: Moderate    PATIENT EDUCATION:    Education details: Showed pt anatomy images. Explained muscles attachments/  connection, physiology of deep core system/ spinal- thoracic-pelvis-lower kinetic chain as they relate to pt's presentation, Sx, and past Hx. Explained what and how these areas of deficits need to be restored to balance and function    See Therapeutic activity / neuromuscular re-education section  Answered pt's questions.   Person educated: Patient Education method: Explanation, Demonstration, Tactile cues, Verbal cues, and Handouts Education comprehension: verbalized understanding, returned demonstration, verbal cues required, tactile cues required, and needs further education     PLAN: PT FREQUENCY: 1x/week   PT DURATION: 10 weeks   PLANNED INTERVENTIONS: Therapeutic exercises, Therapeutic activity, Neuromuscular re-education, Balance training, Gait training, Patient/Family education, Self Care, Joint mobilization, Spinal mobilization, Moist heat, Taping, and Manual therapy, dry needling.   PLAN FOR NEXT SESSION: See clinical impression for plan     GOALS: Goals reviewed with patient? Yes  SHORT TERM GOALS: Target date: 08/29/2022    Pt will demo IND with HEP                    Baseline: Not IND            Goal status: INITIAL   LONG TERM GOALS: Target date: 10/10/2022   1.Pt will demo proper deep core coordination without chest breathing and optimal excursion of diaphragm/pelvic floor in order to promote spinal stability and pelvic floor function  Baseline: dyscoordination Goal status: INITIAL  2.  Pt will demo > 5 pt change on FOTO  to improve QOL and function   Pelvic Pain baseline -33  Bowel  constipation baseline -68 Bowel Leakage baseline -69    Goal status: INITIAL  3.  Pt will demo proper body mechanics in against gravity tasks and ADLs  work tasks, fitness  to minimize straining pelvic floor / back                  Baseline: not IND, improper form that places strain on pelvic floor                Goal status: INITIAL    4. Pt will walk > 3000 steps  before the onset of pain in order to maintain mobility   Baseline: Pt walks 4000 steps  in her yard but feels the pain coming on at 2000 steps  Goal status: INITIAL    5. Pt will be able drive and sit on non-soft cushions for > 30 min with 50% less glut pain in order to participate in community activities  Baseline:  driving and sitting on non-soft cushions caused R glut pain  Goal status: INITIAL   6. Pt will demo increased gait speed > 1.3 m/s in order to ambulate safely in community with decreased risk for falls   Baseline: 1.15 m/s, limited trunk rotation, decreased stance on L, thorax shifted to L) ,  Goal status: INITIAL                         7. Pt will demo levelled pelvic girdle or flank space B with equal fingers width to indicate thoracic shift to medial line and pt and sister will be IND with scoliosis -specific HEP in order to restore  mobility at spine, pelvis, gait, posture  Baseline: scoliosis curve: flank space: L 2 fingers, R 4 fingers width between low rib/ iliac crest.) ( thoracic kyphosis/ forward head : 21 cm from earlobe to wall )    Jerl Mina, PT 08/07/2022, 2:10 PM

## 2022-08-07 NOTE — Patient Instructions (Signed)
Seated :  3 sets a day    1)  Rainbow with R arm 10 reps    2)  Seated twist, L hand on R thigh Inhale tall Exhale turn R at navel, heart, look over R shoulder  __  Discontinue last week's exercise

## 2022-08-11 ENCOUNTER — Ambulatory Visit: Payer: Medicare HMO | Admitting: Physical Therapy

## 2022-08-11 DIAGNOSIS — M4125 Other idiopathic scoliosis, thoracolumbar region: Secondary | ICD-10-CM | POA: Diagnosis not present

## 2022-08-11 DIAGNOSIS — R278 Other lack of coordination: Secondary | ICD-10-CM | POA: Diagnosis not present

## 2022-08-11 DIAGNOSIS — R2689 Other abnormalities of gait and mobility: Secondary | ICD-10-CM

## 2022-08-11 NOTE — Patient Instructions (Signed)
  Avoid straining pelvic floor, abdominal muscles , spine  Use log rolling technique instead of getting out of bed with your neck or the sit-up     Log rolling into and out of bed   Log rolling into and out of bed If getting out of bed on R side, Bent knees, scoot hips/ shoulder to L  Raise R arm completely overhead, rolling onto armpit  Then lower bent knees to bed to get into complete side lying position  Then drop legs off bed, and push up onto R elbow/forearm, and use L hand to push onto the bed    Dig elbows and feet to lift hte buttocks and scoot without lifting head

## 2022-08-11 NOTE — Therapy (Signed)
OUTPATIENT PHYSICAL THERAPY Treatment    Patient Name: KASSY MCENROE MRN: [id number] DOB:05-19-1951, 71 y.o., female Today's Date: 08/11/2022   PT End of Session - 08/11/22 1510     Visit Number 3    Number of Visits 10    PT Start Time 1505    PT Stop Time 1545    PT Time Calculation (min) 40 min    Activity Tolerance Patient tolerated treatment well    Behavior During Therapy WFL for tasks assessed/performed             Past Medical History:  Diagnosis Date   A-fib ([place])    Cancer ([place])    Basal cell ( bridge of her nose)    CHF (congestive heart failure) (HCC)    Headache    Migraines   Hx of migraine headaches    Hypertension    Medical history non-contributory    Mitral valve prolapse at 19 yrs of age   Past Surgical History:  Procedure Laterality Date   CARDIAC CATHETERIZATION     CATARACT EXTRACTION  02/2017   COLONOSCOPY WITH PROPOFOL N/A 03/03/2017   Procedure: COLONOSCOPY WITH PROPOFOL;  Surgeon: Garlan Fair, MD;  Location: [facility];  Service: Endoscopy;  Laterality: N/A;   COLONOSCOPY WITH PROPOFOL N/A 02/18/2022   Procedure: COLONOSCOPY WITH PROPOFOL;  Surgeon: Lesly Rubenstein, MD;  Location: [facility];  Service: Endoscopy;  Laterality: N/A;   OOPHORECTOMY  2005   Patient Active Problem List   Diagnosis Date Noted   Painful veins 08/07/2018    PCP: Lovie Macadamia MD    REFERRING PROVIDER:Schermerhorn MD  REFERRING DIAG: Pelvic Pain  Rationale for Evaluation and Treatment Rehabilitation  THERAPY DIAG:  Other idiopathic scoliosis, thoracolumbar region  Other abnormalities of gait and mobility  Other lack of coordination  ONSET DATE: 6+ months ago  SUBJECTIVE:                       SUBJECTIVE TODAY  Pt reports 65% improved on LBP, Pelvic pain is improved by 20% and her upper back feels better.  Pt took a medication that is causing migraines today  Sitting is still painful                                                                                                                                                                         SUBJECTIVE STATEMENT EVAl on 07/31/22             Sister present and answered some questions for pt.   1) R posterior glut pain started 6 + months ago. Driving stickshift , sitting on non-cushion chairs caused pain. Pain radiates to back of thigh started more recently.  Pt walks 4000 steps  in her yard but feels the pain coming on at 2000 steps . Pt used to do 8000 steps prior to this pain.  o 3/10. Currently sitting on plinth 5/10.   Pain 6/10 2) sometimes pain at R by ASIS : 4-5/10 , pt thinks it is related to bowel movements. Pt has Type 1 , 3 stool consistency.  Drinks 3-4 glasses a day. 2 glasses juice, herbal tea, coffee.    PERTINENT HISTORY:  Scoliosis, CA skin, A-fib/ CHF, tachycardia , pacemaker ( total  7 different heart conditions) , recent dementia Dx, twin has Alzheimers   PAIN:  Are you having pain? See above  PRECAUTIONS: No  WEIGHT BEARING RESTRICTIONS: No  FALLS:  Has patient fallen in last 6 months? Yes, fell on the grass   LIVING ENVIRONMENT: Lives with: lives alone  Lives in: House/apartment Stairs: Yes STE 3 with rail only in the back  Has following equipment at home: No   OCCUPATION: retired Pharmacist, hospital , hobbies: gardening.   PLOF: Independent  PATIENT GOALS:   Not have pain, walk without pain    OBJECTIVE:    [facility] PT Assessment - 08/11/22 1513       Observation/Other Assessments   Observations rotational compenent of scoliosis    Scoliosis ( scoliosis curve: flank space: L 2 fingers, R 3 fingers width between low rib/ iliac crest.)      Palpation   SI assessment  iliac crest levelled     Palpation comment tightness along R T-7-12 intercostals, paraspinals/. interspinals R , L      Bed Mobility   Bed Mobility --   crunch method, diffiuculty comprehending multistep task with log rolling            [facility] Adult PT  Treatment/Exercise - 08/11/22 1513       Neuro Re-ed    Neuro Re-ed Details  cued for log rolling to minimzie risk for Fx and worsening of osteoporosis      Modalities   Modalities Moist Heat      Moist Heat Therapy   Number Minutes Moist Heat 5 Minutes    Moist Heat Location --   thoracic ( unbilled)     Manual Therapy   Manual therapy comments STM/MWM at R intercostals/ paraspinals to minimize kyhophysis and rotational component to scoliosis                   HOME EXERCISE PROGRAM: See pt instruction section    ASSESSMENT:  CLINICAL IMPRESSION:   Addressed rotational compenent of scoliosis today with manual Tx to R UE to promote more R posterior rotation of thorax and lengthening of intercostals.  Pt required cues with log rolling instead of crunch method which is going to help minimize risk for osteoporosis/ Fx given pt's thoracic kyphosis. Review this technique today as pt showed poor carry over  Pt reported no pelvic pain at the end of Tx today.  Plan to continue addressing rotational component of scoliosis at next session.   Sister was present today.    Pt benefits from skilled PT.    OBJECTIVE IMPAIRMENTS decreased activity tolerance, decreased coordination, decreased endurance, decreased mobility, difficulty walking, decreased ROM, decreased strength, decreased safety awareness, hypomobility, increased muscle spasms, impaired flexibility, improper body mechanics, postural dysfunction, and pain.    ACTIVITY LIMITATIONS  self-care,  sleep, home chores, work tasks    PARTICIPATION LIMITATIONS:  community, gardening    PERSONAL FACTORS   scoliosis, recent  dementia Dx, lives alone are factors also affecting patient's functional outcome.    REHAB POTENTIAL: Good   CLINICAL DECISION MAKING: Evolving/moderate complexity   EVALUATION COMPLEXITY: Moderate    PATIENT EDUCATION:    Education details: Showed pt anatomy images. Explained muscles attachments/  connection, physiology of deep core system/ spinal- thoracic-pelvis-lower kinetic chain as they relate to pt's presentation, Sx, and past Hx. Explained what and how these areas of deficits need to be restored to balance and function    See Therapeutic activity / neuromuscular re-education section  Answered pt's questions.   Person educated: Patient Education method: Explanation, Demonstration, Tactile cues, Verbal cues, and Handouts Education comprehension: verbalized understanding, returned demonstration, verbal cues required, tactile cues required, and needs further education     PLAN: PT FREQUENCY: 1x/week   PT DURATION: 10 weeks   PLANNED INTERVENTIONS: Therapeutic exercises, Therapeutic activity, Neuromuscular re-education, Balance training, Gait training, Patient/Family education, Self Care, Joint mobilization, Spinal mobilization, Moist heat, Taping, and Manual therapy, dry needling.   PLAN FOR NEXT SESSION: See clinical impression for plan     GOALS: Goals reviewed with patient? Yes  SHORT TERM GOALS: Target date: 08/29/2022    Pt will demo IND with HEP                    Baseline: Not IND            Goal status: INITIAL   LONG TERM GOALS: Target date: 10/10/2022   1.Pt will demo proper deep core coordination without chest breathing and optimal excursion of diaphragm/pelvic floor in order to promote spinal stability and pelvic floor function  Baseline: dyscoordination Goal status: INITIAL  2.  Pt will demo > 5 pt change on FOTO  to improve QOL and function   Pelvic Pain baseline -33  Bowel  constipation baseline -68 Bowel Leakage baseline -69    Goal status: INITIAL  3.  Pt will demo proper body mechanics in against gravity tasks and ADLs  work tasks, fitness  to minimize straining pelvic floor / back                  Baseline: not IND, improper form that places strain on pelvic floor                Goal status: INITIAL    4. Pt will walk > 3000 steps  before the onset of pain in order to maintain mobility   Baseline: Pt walks 4000 steps  in her yard but feels the pain coming on at 2000 steps  Goal status: INITIAL    5. Pt will be able drive and sit on non-soft cushions for > 30 min with 50% less glut pain in order to participate in community activities  Baseline:  driving and sitting on non-soft cushions caused R glut pain  Goal status: INITIAL   6. Pt will demo increased gait speed > 1.3 m/s in order to ambulate safely in community with decreased risk for falls   Baseline: 1.15 m/s, limited trunk rotation, decreased stance on L, thorax shifted to L) ,  Goal status: INITIAL                         7. Pt will demo levelled pelvic girdle or flank space B with equal fingers width to indicate thoracic shift to medial line and pt and sister will be IND with scoliosis -specific HEP in order to restore mobility at  spine, pelvis, gait, posture  Baseline: scoliosis curve: flank space: L 2 fingers, R 4 fingers width between low rib/ iliac crest.) ( thoracic kyphosis/ forward head : 21 cm from earlobe to wall )    Jerl Mina, PT 08/11/2022, 3:12 PM

## 2022-08-21 ENCOUNTER — Ambulatory Visit: Payer: Medicare HMO | Admitting: Physical Therapy

## 2022-08-21 DIAGNOSIS — R278 Other lack of coordination: Secondary | ICD-10-CM | POA: Diagnosis not present

## 2022-08-21 DIAGNOSIS — M4125 Other idiopathic scoliosis, thoracolumbar region: Secondary | ICD-10-CM | POA: Diagnosis not present

## 2022-08-21 DIAGNOSIS — R2689 Other abnormalities of gait and mobility: Secondary | ICD-10-CM | POA: Diagnosis not present

## 2022-08-21 NOTE — Therapy (Signed)
OUTPATIENT PHYSICAL THERAPY Treatment    Patient Name: AMRITHA YORKE MRN: [id number] DOB:12-Feb-1951, 71 y.o., female Today's Date: 08/21/2022   PT End of Session - 08/21/22 1023     Visit Number 4    Number of Visits 10    PT Start Time 1015    PT Stop Time 1100    PT Time Calculation (min) 45 min    Activity Tolerance Patient tolerated treatment well    Behavior During Therapy WFL for tasks assessed/performed             Past Medical History:  Diagnosis Date   A-fib ([place])    Cancer ([place])    Basal cell ( bridge of her nose)    CHF (congestive heart failure) (HCC)    Headache    Migraines   Hx of migraine headaches    Hypertension    Medical history non-contributory    Mitral valve prolapse at 19 yrs of age   Past Surgical History:  Procedure Laterality Date   CARDIAC CATHETERIZATION     CATARACT EXTRACTION  02/2017   COLONOSCOPY WITH PROPOFOL N/A 03/03/2017   Procedure: COLONOSCOPY WITH PROPOFOL;  Surgeon: Garlan Fair, MD;  Location: [facility];  Service: Endoscopy;  Laterality: N/A;   COLONOSCOPY WITH PROPOFOL N/A 02/18/2022   Procedure: COLONOSCOPY WITH PROPOFOL;  Surgeon: Lesly Rubenstein, MD;  Location: [facility];  Service: Endoscopy;  Laterality: N/A;   OOPHORECTOMY  2005   Patient Active Problem List   Diagnosis Date Noted   Painful veins 08/07/2018    PCP: Lovie Macadamia MD    REFERRING PROVIDER:Schermerhorn MD  REFERRING DIAG: Pelvic Pain  Rationale for Evaluation and Treatment Rehabilitation  THERAPY DIAG:  No diagnosis found.  ONSET DATE: 6+ months ago  SUBJECTIVE:                       SUBJECTIVE TODAY  Pt reports 75% improved on LBP compared to 65% from last week.   Pelvic pain is improved by 20% but it is I hurting more on the R.     Sitting was painful in the car ride 45 min one way commute.                                                                                                             Sister says pt has  been doing her exercises.                                                               SUBJECTIVE STATEMENT EVAl on 07/31/22             Sister present and answered some questions for pt.   1) R posterior glut pain started 6 + months ago. Driving stickshift , sitting on non-cushion chairs caused pain.  Pain radiates to back of thigh started more recently. Pt walks 4000 steps  in her yard but feels the pain coming on at 2000 steps . Pt used to do 8000 steps prior to this pain.  o 3/10. Currently sitting on plinth 5/10.   Pain 6/10 2) sometimes pain at R by ASIS : 4-5/10 , pt thinks it is related to bowel movements. Pt has Type 1 , 3 stool consistency.  Drinks 3-4 glasses a day. 2 glasses juice, herbal tea, coffee.    PERTINENT HISTORY:  Scoliosis, CA skin, A-fib/ CHF, tachycardia , pacemaker ( total  7 different heart conditions) , recent dementia Dx, twin has Alzheimers   PAIN:  Are you having pain? See above  PRECAUTIONS: No  WEIGHT BEARING RESTRICTIONS: No  FALLS:  Has patient fallen in last 6 months? Yes, fell on the grass   LIVING ENVIRONMENT: Lives with: lives alone  Lives in: House/apartment Stairs: Yes STE 3 with rail only in the back  Has following equipment at home: No   OCCUPATION: retired Pharmacist, hospital , hobbies: gardening.   PLOF: Independent  PATIENT GOALS:   Not have pain, walk without pain    OBJECTIVE:     [facility] PT Assessment - 08/21/22 1025       Observation/Other Assessments   Observations more upright but significant L trunk lean with posterior rotation of upper quadrant      Coordination   Coordination and Movement Description difficulty following bed mobility instructions , poor propioception with aligning body on bed      Palpation   SI assessment  iliac crest levelled     Palpation comment tightness along L T5-10 anterior/ posterior, interspinals, T3-5 ,             [facility] Adult PT Treatment/Exercise - 08/21/22 1325       Therapeutic  Activites    Other Therapeutic Activities bed mobility practice with log rolling      Modalities   Modalities Moist Heat      Moist Heat Therapy   Number Minutes Moist Heat 5 Minutes    Moist Heat Location --   posterior rotation of thoracic  ( unbilled)     Manual Therapy   Manual therapy comments STM/MWM at L intercostals/ paraspinals to minimize kyhophysis and rotational component to scoliosis                 HOME EXERCISE PROGRAM: See pt instruction section    ASSESSMENT:  CLINICAL IMPRESSION: Pt is making improvements with LBP and pelvic pain. Pt reports feeling R pelvic pain more this week. Plan to assess pelvic floor next session.    Addressed rotational compenent of scoliosis today with manual Tx to L thoracic area today as pt demo'd significant L trunk lean. Pt demo'd more medial alignment post Tx. Plan to add strengthening exercise at upcoming sessions to minimize this lean which will also help with decrease pelvic pain.   Pt required cues with log rolling with moderate carry over.   Plan to continue addressing rotational component of scoliosis at next session.  Sister was present today and reports pt has been compliant with exercises. Pt has been able to [employer] of HEP on her computer which helps her to do them.   Pt benefits from skilled PT.    OBJECTIVE IMPAIRMENTS decreased activity tolerance, decreased coordination, decreased endurance, decreased mobility, difficulty walking, decreased ROM, decreased strength, decreased safety awareness, hypomobility, increased muscle spasms, impaired flexibility, improper body mechanics,  postural dysfunction, and pain.    ACTIVITY LIMITATIONS  self-care,  sleep, home chores, work tasks    PARTICIPATION LIMITATIONS:  community, gardening    PERSONAL FACTORS   scoliosis, recent dementia Dx, lives alone are factors also affecting patient's functional outcome.    REHAB POTENTIAL: Good   CLINICAL DECISION MAKING:  Evolving/moderate complexity   EVALUATION COMPLEXITY: Moderate    PATIENT EDUCATION:    Education details: Showed pt anatomy images. Explained muscles attachments/ connection, physiology of deep core system/ spinal- thoracic-pelvis-lower kinetic chain as they relate to pt's presentation, Sx, and past Hx. Explained what and how these areas of deficits need to be restored to balance and function    See Therapeutic activity / neuromuscular re-education section  Answered pt's questions.   Person educated: Patient Education method: Explanation, Demonstration, Tactile cues, Verbal cues, and Handouts Education comprehension: verbalized understanding, returned demonstration, verbal cues required, tactile cues required, and needs further education     PLAN: PT FREQUENCY: 1x/week   PT DURATION: 10 weeks   PLANNED INTERVENTIONS: Therapeutic exercises, Therapeutic activity, Neuromuscular re-education, Balance training, Gait training, Patient/Family education, Self Care, Joint mobilization, Spinal mobilization, Moist heat, Taping, and Manual therapy, dry needling.   PLAN FOR NEXT SESSION: See clinical impression for plan     GOALS: Goals reviewed with patient? Yes  SHORT TERM GOALS: Target date: 08/29/2022    Pt will demo IND with HEP                    Baseline: Not IND            Goal status: INITIAL   LONG TERM GOALS: Target date: 10/10/2022   1.Pt will demo proper deep core coordination without chest breathing and optimal excursion of diaphragm/pelvic floor in order to promote spinal stability and pelvic floor function  Baseline: dyscoordination Goal status: INITIAL  2.  Pt will demo > 5 pt change on FOTO  to improve QOL and function   Pelvic Pain baseline -33  Bowel  constipation baseline -68 Bowel Leakage baseline -69    Goal status: INITIAL  3.  Pt will demo proper body mechanics in against gravity tasks and ADLs  work tasks, fitness  to minimize straining pelvic  floor / back                  Baseline: not IND, improper form that places strain on pelvic floor                Goal status: INITIAL    4. Pt will walk > 3000 steps before the onset of pain in order to maintain mobility   Baseline: Pt walks 4000 steps  in her yard but feels the pain coming on at 2000 steps  Goal status: INITIAL    5. Pt will be able drive and sit on non-soft cushions for > 30 min with 50% less glut pain in order to participate in community activities  Baseline:  driving and sitting on non-soft cushions caused R glut pain  Goal status: INITIAL   6. Pt will demo increased gait speed > 1.3 m/s in order to ambulate safely in community with decreased risk for falls   Baseline: 1.15 m/s, limited trunk rotation, decreased stance on L, thorax shifted to L) ,  Goal status: INITIAL                         7. Pt will demo levelled  pelvic girdle or flank space B with equal fingers width to indicate thoracic shift to medial line and pt and sister will be IND with scoliosis -specific HEP in order to restore mobility at spine, pelvis, gait, posture  Baseline: scoliosis curve: flank space: L 2 fingers, R 4 fingers width between low rib/ iliac crest.) ( thoracic kyphosis/ forward head : 21 cm from earlobe to wall )    Jerl Mina, PT 08/21/2022, 10:24 AM

## 2022-08-28 ENCOUNTER — Ambulatory Visit: Payer: Medicare HMO | Attending: Obstetrics and Gynecology | Admitting: Physical Therapy

## 2022-08-28 DIAGNOSIS — M4125 Other idiopathic scoliosis, thoracolumbar region: Secondary | ICD-10-CM | POA: Diagnosis not present

## 2022-08-28 DIAGNOSIS — R2689 Other abnormalities of gait and mobility: Secondary | ICD-10-CM

## 2022-08-28 DIAGNOSIS — R278 Other lack of coordination: Secondary | ICD-10-CM

## 2022-08-28 NOTE — Therapy (Signed)
OUTPATIENT PHYSICAL THERAPY Treatment    Patient Name: Doris [name] MRN: [id number] DOB:1951-01-21, 71 y.o., female Today's Date: 08/28/2022   PT End of Session - 08/28/22 1025     Visit Number 5    Number of Visits 10    PT Start Time 1017    PT Stop Time 1100    PT Time Calculation (min) 43 min    Activity Tolerance Patient tolerated treatment well    Behavior During Therapy WFL for tasks assessed/performed             Past Medical History:  Diagnosis Date   A-fib ([place])    Cancer ([place])    Basal cell ( bridge of her nose)    CHF (congestive heart failure) (HCC)    Headache    Migraines   Hx of migraine headaches    Hypertension    Medical history non-contributory    Mitral valve prolapse at 19 yrs of age   Past Surgical History:  Procedure Laterality Date   CARDIAC CATHETERIZATION     CATARACT EXTRACTION  02/2017   COLONOSCOPY WITH PROPOFOL N/A 03/03/2017   Procedure: COLONOSCOPY WITH PROPOFOL;  Surgeon: Garlan Fair, MD;  Location: [facility];  Service: Endoscopy;  Laterality: N/A;   COLONOSCOPY WITH PROPOFOL N/A 02/18/2022   Procedure: COLONOSCOPY WITH PROPOFOL;  Surgeon: Lesly Rubenstein, MD;  Location: [facility];  Service: Endoscopy;  Laterality: N/A;   OOPHORECTOMY  2005   Patient Active Problem List   Diagnosis Date Noted   Painful veins 08/07/2018    PCP: Lovie Macadamia MD    REFERRING PROVIDER:Schermerhorn MD  REFERRING DIAG: Pelvic Pain  Rationale for Evaluation and Treatment Rehabilitation  THERAPY DIAG:  Other idiopathic scoliosis, thoracolumbar region  Other abnormalities of gait and mobility  Other lack of coordination  ONSET DATE: 6+ months ago  SUBJECTIVE:                       SUBJECTIVE TODAY  Pt has been doing her exercises. Pt will have an MRI in January for LBP and a brain scan.   The inner part of the glut and labia R is 6/10 today. The pain by R ASIS went away after last session but it has returned at 4/10.                                                     SUBJECTIVE STATEMENT EVAl on 07/31/22             Sister present and answered some questions for pt.   1) R posterior glut pain started 6 + months ago. Driving stickshift , sitting on non-cushion chairs caused pain. Pain radiates to back of thigh started more recently. Pt walks 4000 steps  in her yard but feels the pain coming on at 2000 steps . Pt used to do 8000 steps prior to this pain.  o 3/10. Currently sitting on plinth 5/10.   Pain 6/10 2) sometimes pain at R by ASIS : 4-5/10 , pt thinks it is related to bowel movements. Pt has Type 1 , 3 stool consistency.  Drinks 3-4 glasses a day. 2 glasses juice, herbal tea, coffee.    PERTINENT HISTORY:  Scoliosis, CA skin, A-fib/ CHF, tachycardia , pacemaker ( total  7 different heart conditions) ,  recent dementia Dx, twin has Alzheimers   PAIN:  Are you having pain? See above  PRECAUTIONS: No  WEIGHT BEARING RESTRICTIONS: No  FALLS:  Has patient fallen in last 6 months? Yes, fell on the grass   LIVING ENVIRONMENT: Lives with: lives alone  Lives in: House/apartment Stairs: Yes STE 3 with rail only in the back  Has following equipment at home: No   OCCUPATION: retired Pharmacist, hospital , hobbies: gardening.   PLOF: Independent  PATIENT GOALS:   Not have pain, walk without pain    OBJECTIVE:   [facility] PT Assessment - 08/28/22 1026       Observation/Other Assessments   Observations more upright but significant L trunk lean with less posterior rotation of upper quadrant   Post Tx: gait: more equal Wbing on BLE and reciporcal pattern of thorax and pelvis    Scoliosis ( scoliosis curve: flank space: L 2 fingers, R 3 fingers width between low rib/ iliac crest.)               [facility] Adult PT Treatment/Exercise - 08/28/22 1223       Bed Mobility   Bed Mobility --   confusion with instruction to lie on plinth     Therapeutic Activites    Other Therapeutic Activities explained  importance of addressing scoliois for medial alignment of thorax over pelvis to impact pelvic floor      Modalities   Modalities Moist Heat      Moist Heat Therapy   Moist Heat Location --   posterior rotation of thoracic ( during assessment of pelvic floor     Manual Therapy   Manual therapy comments STM/MWM at L intercostals/ paraspinals to minimize kyhophysis and rotational component to scoliosis, to decrease R pelvic floor tightness/ tenderess,  distraction at hip joint, STM/MWM                  HOME EXERCISE PROGRAM: See pt instruction section    ASSESSMENT:  CLINICAL IMPRESSION: Continued with manual Tx to address rotational component of scoliosis after which pt 's thorax appeared more medially aligned with pelvis. Pelvic floor assessment through clothing showed tightness/ tenderness at R pelvic floor which is related to pt 's thoracic misalignment.   Post Tx. Her gait showed more equal Wbing on BLE and reciporcal pattern of thorax and pelvis   Pt will need more strengthening of R flank to maintain thorax over pelvis and minimize scoliosis worsening at next session.   Pt benefits from skilled PT.    OBJECTIVE IMPAIRMENTS decreased activity tolerance, decreased coordination, decreased endurance, decreased mobility, difficulty walking, decreased ROM, decreased strength, decreased safety awareness, hypomobility, increased muscle spasms, impaired flexibility, improper body mechanics, postural dysfunction, and pain.    ACTIVITY LIMITATIONS  self-care,  sleep, home chores, work tasks    PARTICIPATION LIMITATIONS:  community, gardening    PERSONAL FACTORS   scoliosis, recent dementia Dx, lives alone are factors also affecting patient's functional outcome.    REHAB POTENTIAL: Good   CLINICAL DECISION MAKING: Evolving/moderate complexity   EVALUATION COMPLEXITY: Moderate    PATIENT EDUCATION:    Education details: Showed pt anatomy images. Explained muscles  attachments/ connection, physiology of deep core system/ spinal- thoracic-pelvis-lower kinetic chain as they relate to pt's presentation, Sx, and past Hx. Explained what and how these areas of deficits need to be restored to balance and function    See Therapeutic activity / neuromuscular re-education section  Answered pt's questions.   Person educated: Patient  Education method: Explanation, Demonstration, Tactile cues, Verbal cues, and Handouts Education comprehension: verbalized understanding, returned demonstration, verbal cues required, tactile cues required, and needs further education     PLAN: PT FREQUENCY: 1x/week   PT DURATION: 10 weeks   PLANNED INTERVENTIONS: Therapeutic exercises, Therapeutic activity, Neuromuscular re-education, Balance training, Gait training, Patient/Family education, Self Care, Joint mobilization, Spinal mobilization, Moist heat, Taping, and Manual therapy, dry needling.   PLAN FOR NEXT SESSION: See clinical impression for plan     GOALS: Goals reviewed with patient? Yes  SHORT TERM GOALS: Target date: 08/29/2022    Pt will demo IND with HEP                    Baseline: Not IND            Goal status: INITIAL   LONG TERM GOALS: Target date: 10/10/2022   1.Pt will demo proper deep core coordination without chest breathing and optimal excursion of diaphragm/pelvic floor in order to promote spinal stability and pelvic floor function  Baseline: dyscoordination Goal status: INITIAL  2.  Pt will demo > 5 pt change on FOTO  to improve QOL and function   Pelvic Pain baseline -33  Bowel  constipation baseline -68 Bowel Leakage baseline -69    Goal status: INITIAL  3.  Pt will demo proper body mechanics in against gravity tasks and ADLs  work tasks, fitness  to minimize straining pelvic floor / back                  Baseline: not IND, improper form that places strain on pelvic floor                Goal status: INITIAL    4. Pt will walk  > 3000 steps before the onset of pain in order to maintain mobility   Baseline: Pt walks 4000 steps  in her yard but feels the pain coming on at 2000 steps  Goal status: INITIAL    5. Pt will be able drive and sit on non-soft cushions for > 30 min with 50% less glut pain in order to participate in community activities  Baseline:  driving and sitting on non-soft cushions caused R glut pain  Goal status: INITIAL   6. Pt will demo increased gait speed > 1.3 m/s in order to ambulate safely in community with decreased risk for falls   Baseline: 1.15 m/s, limited trunk rotation, decreased stance on L, thorax shifted to L) ,  Goal status: INITIAL                         7. Pt will demo levelled pelvic girdle or flank space B with equal fingers width to indicate thoracic shift to medial line and pt and sister will be IND with scoliosis -specific HEP in order to restore mobility at spine, pelvis, gait, posture  Baseline: scoliosis curve: flank space: L 2 fingers, R 4 fingers width between low rib/ iliac crest.) ( thoracic kyphosis/ forward head : 21 cm from earlobe to wall )    Jerl Mina, PT 08/28/2022, 10:27 AM

## 2022-09-03 NOTE — Progress Notes (Signed)
Remote pacemaker transmission.   

## 2022-09-04 ENCOUNTER — Ambulatory Visit: Payer: Medicare HMO | Admitting: Physical Therapy

## 2022-09-04 DIAGNOSIS — R278 Other lack of coordination: Secondary | ICD-10-CM

## 2022-09-04 DIAGNOSIS — R2689 Other abnormalities of gait and mobility: Secondary | ICD-10-CM | POA: Diagnosis not present

## 2022-09-04 DIAGNOSIS — M4125 Other idiopathic scoliosis, thoracolumbar region: Secondary | ICD-10-CM | POA: Diagnosis not present

## 2022-09-04 NOTE — Therapy (Signed)
OUTPATIENT PHYSICAL THERAPY Treatment    Patient Name: Doris [name] MRN: [id number] DOB:1950-10-28, 71 y.o., female Today's Date: 09/04/2022   PT End of Session - 09/04/22 1028     Visit Number 6    Number of Visits 10    Date for PT Re-Evaluation 10/10/22    PT Start Time 1020    PT Stop Time 1100    PT Time Calculation (min) 40 min    Activity Tolerance Patient tolerated treatment well    Behavior During Therapy WFL for tasks assessed/performed             Past Medical History:  Diagnosis Date   A-fib ([place])    Cancer ([place])    Basal cell ( bridge of her nose)    CHF (congestive heart failure) (HCC)    Headache    Migraines   Hx of migraine headaches    Hypertension    Medical history non-contributory    Mitral valve prolapse at 19 yrs of age   Past Surgical History:  Procedure Laterality Date   CARDIAC CATHETERIZATION     CATARACT EXTRACTION  02/2017   COLONOSCOPY WITH PROPOFOL N/A 03/03/2017   Procedure: COLONOSCOPY WITH PROPOFOL;  Surgeon: Garlan Fair, MD;  Location: [facility];  Service: Endoscopy;  Laterality: N/A;   COLONOSCOPY WITH PROPOFOL N/A 02/18/2022   Procedure: COLONOSCOPY WITH PROPOFOL;  Surgeon: Lesly Rubenstein, MD;  Location: [facility];  Service: Endoscopy;  Laterality: N/A;   OOPHORECTOMY  2005   Patient Active Problem List   Diagnosis Date Noted   Painful veins 08/07/2018    PCP: Lovie Macadamia MD    REFERRING PROVIDER:Schermerhorn MD  REFERRING DIAG: Pelvic Pain  Rationale for Evaluation and Treatment Rehabilitation  THERAPY DIAG:  Other idiopathic scoliosis, thoracolumbar region  Other abnormalities of gait and mobility  Other lack of coordination  ONSET DATE: 6+ months ago  SUBJECTIVE:                       SUBJECTIVE TODAY  Pt' \\reported'$  her pelvic pain improved after last session until she had to drive her truck with  manual stick shift and pelvic pain returned to 4/10.                                                     SUBJECTIVE STATEMENT EVAl on 07/31/22             Sister present and answered some questions for pt.   1) R posterior glut pain started 6 + months ago. Driving stickshift , sitting on non-cushion chairs caused pain. Pain radiates to back of thigh started more recently. Pt walks 4000 steps  in her yard but feels the pain coming on at 2000 steps . Pt used to do 8000 steps prior to this pain.  o 3/10. Currently sitting on plinth 5/10.   Pain 6/10 2) sometimes pain at R by ASIS : 4-5/10 , pt thinks it is related to bowel movements. Pt has Type 1 , 3 stool consistency.  Drinks 3-4 glasses a day. 2 glasses juice, herbal tea, coffee.    PERTINENT HISTORY:  Scoliosis, CA skin, A-fib/ CHF, tachycardia , pacemaker ( total  7 different heart conditions) , recent dementia Dx, twin has Alzheimers   PAIN:  Are you having pain?  See above  PRECAUTIONS: No  WEIGHT BEARING RESTRICTIONS: No  FALLS:  Has patient fallen in last 6 months? Yes, fell on the grass   LIVING ENVIRONMENT: Lives with: lives alone  Lives in: House/apartment Stairs: Yes STE 3 with rail only in the back  Has following equipment at home: No   OCCUPATION: retired Pharmacist, hospital , hobbies: gardening.   PLOF: Independent  PATIENT GOALS:   Not have pain, walk without pain    OBJECTIVE:      [facility] PT Assessment - 09/04/22 1405       Observation/Other Assessments   Observations less L trunk lean      Palpation   Palpation comment tightness along T3-5 posterior L, interspinal mm tightness along medial scapula R,      Bed Mobility   Bed Mobility --   pt did not recall location of pillow when instructed to lie on side            [facility] Adult PT Treatment/Exercise - 09/04/22 1351       Neuro Re-ed    Neuro Re-ed Details  cued for two new seated exercise  with deep core coordination with yellow band for thoracic ext/ scapular/ cervical retraction with shoulder ER      Manual Therapy   Manual therapy  comments STM/MWM at problem areas noted in assessment R intercostals interspinals,  thoracic segment 3-5 to promote mobility and medial alignmetn of thorax                 HOME EXERCISE PROGRAM: See pt instruction section    ASSESSMENT:  CLINICAL IMPRESSION: Gait continues to be better and stride is lengthened with more equal WBing.    Rotational component of scoliosis is less prominent. Focused on thoracic kyphosis and L trunk shift to medial alignment today with manual Tx and new HEP.  With two new seated exercise, pt required cues for  deep core coordination with yellow band and  thoracic ext/ scapular/ cervical retraction with shoulder ER. Added scoliosis isometric strenghtening and lengthening of L flank to shift thorax medially from L to midline.     Plan to apply more manual Tx to R medial scapula which still has tightness along interspinal/ paraspinal mm/ intercostal mm. After Tx to this area today , pt showed more medial alignment of thorax. Pt reported decreased pelvic pain on R glut area from 4/10 to 3-4/10.   Pt benefits from skilled PT.    OBJECTIVE IMPAIRMENTS decreased activity tolerance, decreased coordination, decreased endurance, decreased mobility, difficulty walking, decreased ROM, decreased strength, decreased safety awareness, hypomobility, increased muscle spasms, impaired flexibility, improper body mechanics, postural dysfunction, and pain.    ACTIVITY LIMITATIONS  self-care,  sleep, home chores, work tasks    PARTICIPATION LIMITATIONS:  community, gardening    PERSONAL FACTORS   scoliosis, recent dementia Dx, lives alone are factors also affecting patient's functional outcome.    REHAB POTENTIAL: Good   CLINICAL DECISION MAKING: Evolving/moderate complexity   EVALUATION COMPLEXITY: Moderate    PATIENT EDUCATION:    Education details: Showed pt anatomy images. Explained muscles attachments/ connection, physiology of deep core system/ spinal-  thoracic-pelvis-lower kinetic chain as they relate to pt's presentation, Sx, and past Hx. Explained what and how these areas of deficits need to be restored to balance and function    See Therapeutic activity / neuromuscular re-education section  Answered pt's questions.   Person educated: Patient Education method: Explanation, Demonstration, Tactile cues, Verbal cues, and Handouts Education comprehension:  verbalized understanding, returned demonstration, verbal cues required, tactile cues required, and needs further education     PLAN: PT FREQUENCY: 1x/week   PT DURATION: 10 weeks   PLANNED INTERVENTIONS: Therapeutic exercises, Therapeutic activity, Neuromuscular re-education, Balance training, Gait training, Patient/Family education, Self Care, Joint mobilization, Spinal mobilization, Moist heat, Taping, and Manual therapy, dry needling.   PLAN FOR NEXT SESSION: See clinical impression for plan     GOALS: Goals reviewed with patient? Yes  SHORT TERM GOALS: Target date: 08/29/2022    Pt will demo IND with HEP                    Baseline: Not IND            Goal status: INITIAL   LONG TERM GOALS: Target date: 10/10/2022   1.Pt will demo proper deep core coordination without chest breathing and optimal excursion of diaphragm/pelvic floor in order to promote spinal stability and pelvic floor function  Baseline: dyscoordination Goal status: INITIAL  2.  Pt will demo > 5 pt change on FOTO  to improve QOL and function   Pelvic Pain baseline -33  Bowel  constipation baseline -68 Bowel Leakage baseline -69    Goal status: INITIAL  3.  Pt will demo proper body mechanics in against gravity tasks and ADLs  work tasks, fitness  to minimize straining pelvic floor / back                  Baseline: not IND, improper form that places strain on pelvic floor                Goal status: INITIAL    4. Pt will walk > 3000 steps before the onset of pain in order to maintain  mobility   Baseline: Pt walks 4000 steps  in her yard but feels the pain coming on at 2000 steps  Goal status: INITIAL    5. Pt will be able drive and sit on non-soft cushions for > 30 min with 50% less glut pain in order to participate in community activities  Baseline:  driving and sitting on non-soft cushions caused R glut pain  Goal status: INITIAL   6. Pt will demo increased gait speed > 1.3 m/s in order to ambulate safely in community with decreased risk for falls   Baseline: 1.15 m/s, limited trunk rotation, decreased stance on L, thorax shifted to L) ,  Goal status: INITIAL                         7. Pt will demo levelled pelvic girdle or flank space B with equal fingers width to indicate thoracic shift to medial line and pt and sister will be IND with scoliosis -specific HEP in order to restore mobility at spine, pelvis, gait, posture  Baseline: scoliosis curve: flank space: L 2 fingers, R 4 fingers width between low rib/ iliac crest.) ( thoracic kyphosis/ forward head : 21 cm from earlobe to wall )    Jerl Mina, PT 09/04/2022, 11:35 AM

## 2022-09-12 ENCOUNTER — Ambulatory Visit (HOSPITAL_COMMUNITY): Payer: Medicare HMO

## 2022-10-01 ENCOUNTER — Encounter: Payer: Medicare HMO | Admitting: Physical Therapy

## 2022-10-02 ENCOUNTER — Ambulatory Visit: Payer: Medicare HMO | Attending: Obstetrics and Gynecology | Admitting: Physical Therapy

## 2022-10-02 DIAGNOSIS — R2689 Other abnormalities of gait and mobility: Secondary | ICD-10-CM | POA: Diagnosis not present

## 2022-10-02 DIAGNOSIS — R278 Other lack of coordination: Secondary | ICD-10-CM | POA: Diagnosis not present

## 2022-10-02 DIAGNOSIS — M4125 Other idiopathic scoliosis, thoracolumbar region: Secondary | ICD-10-CM | POA: Insufficient documentation

## 2022-10-02 NOTE — Patient Instructions (Signed)
Walking with higher knees

## 2022-10-02 NOTE — Therapy (Signed)
OUTPATIENT PHYSICAL THERAPY Treatment    Patient Name: Doris [name] MRN: [id number] DOB:03-11-1951, 72 y.o., female Today's Date: 10/02/2022   PT End of Session - 10/02/22 0952     Visit Number 7    Number of Visits 10    Date for PT Re-Evaluation 10/10/22    PT Start Time 0947    PT Stop Time 1020    PT Time Calculation (min) 33 min    Activity Tolerance Patient tolerated treatment well    Behavior During Therapy [facility] for tasks assessed/performed             Past Medical History:  Diagnosis Date   A-fib ([place])    Cancer ([place])    Basal cell ( bridge of her nose)    CHF (congestive heart failure) (HCC)    Headache    Migraines   Hx of migraine headaches    Hypertension    Medical history non-contributory    Mitral valve prolapse at 19 yrs of age   Past Surgical History:  Procedure Laterality Date   CARDIAC CATHETERIZATION     CATARACT EXTRACTION  02/2017   COLONOSCOPY WITH PROPOFOL N/A 03/03/2017   Procedure: COLONOSCOPY WITH PROPOFOL;  Surgeon: Garlan Fair, MD;  Location: [facility];  Service: Endoscopy;  Laterality: N/A;   COLONOSCOPY WITH PROPOFOL N/A 02/18/2022   Procedure: COLONOSCOPY WITH PROPOFOL;  Surgeon: Lesly Rubenstein, MD;  Location: [facility] ENDOSCOPY;  Service: Endoscopy;  Laterality: N/A;   OOPHORECTOMY  2005   Patient Active Problem List   Diagnosis Date Noted   Painful veins 08/07/2018    PCP: Lovie Macadamia MD    REFERRING PROVIDER:Schermerhorn MD  REFERRING DIAG: Pelvic Pain  Rationale for Evaluation and Treatment Rehabilitation  THERAPY DIAG:  Other idiopathic scoliosis, thoracolumbar region  Other abnormalities of gait and mobility  Other lack of coordination  ONSET DATE: 6+ months ago  SUBJECTIVE:                       SUBJECTIVE TODAY  Pt has been doing her exercises. She notices she can take in more breath.   R glut pain today is 6/10.  Pain at R by ASIS : 4-5/10   Bowel movements are less Type 1 and  more Type  5.  Softer stools occur 1/3 of the time and more than she used to have.    Pt also reports R radiating pain down to outside leg to knee / leg is more noticeable.   Pt has an ingrown toe nail                                                    SUBJECTIVE STATEMENT EVAl on 07/31/22             Sister present and answered some questions for pt.   1) R posterior glut pain started 6 + months ago. Driving stickshift , sitting on non-cushion chairs caused pain. Pain radiates to back of thigh started more recently. Pt walks 4000 steps  in her yard but feels the pain coming on at 2000 steps . Pt used to do 8000 steps prior to this pain.  o 3/10. Currently sitting on plinth 5/10.   Pain 6/10 2) sometimes pain at R by ASIS : 4-5/10 , pt thinks it is related  to bowel movements. Pt has Type 1 , 3 stool consistency.  Drinks 3-4 glasses a day. 2 glasses juice, herbal tea, coffee.    PERTINENT HISTORY:  Scoliosis, CA skin, A-fib/ CHF, tachycardia , pacemaker ( total  7 different heart conditions) , recent dementia Dx, twin has Alzheimers   PAIN:  Are you having pain? See above  PRECAUTIONS: No  WEIGHT BEARING RESTRICTIONS: No  FALLS:  Has patient fallen in last 6 months? Yes, fell on the grass   LIVING ENVIRONMENT: Lives with: lives alone  Lives in: House/apartment Stairs: Yes STE 3 with rail only in the back  Has following equipment at home: No   OCCUPATION: retired Pharmacist, hospital , hobbies: gardening.   PLOF: Independent  PATIENT GOALS:   Not have pain, walk without pain    OBJECTIVE:      [facility] PT Assessment - 10/02/22 1019       Observation/Other Assessments   Observations less L trunk lean/ less thoracic kyphosis      Strength   Overall Strength Comments R big toe 3/5, L 3+/5,      Palpation   Palpation comment 15 deg R, 20 deg L hallus adductus, hypomobile midfoot joints R, tightness along medial attachment of hamstrings, tib ant / ext dig on R , adducted knee       Ambulation/Gait   Gait Comments short strides, limited push off, hip flexion B             [facility] Adult PT Treatment/Exercise - 10/02/22 1019       Neuro Re-ed    Neuro Re-ed Details  cued for gait higher knees, longer strides      Manual Therapy   Manual therapy comments STM/MWM at problem areas noted in assessment to prmote ER of femur/ tibia, DF/EV , foot mobility at R foot                HOME EXERCISE PROGRAM: See pt instruction section    ASSESSMENT:  CLINICAL IMPRESSION:   Pt 's scoliosis alignment and thoracic kyphosis is improving.  Stool consistency is softer than than before.   However, R glut pain and R anterior hip / groin pain is making no change that stays long term. Pt also reports R radiating pain down to outside leg to knee / leg is more noticeable.               Today, assessed lower kinetic chain to address these areas further with regional interdependent approach. Pt showed hallus valgus bilaterally with L foot > R and R leg with significant tightness at midfoot joints/ hamstrings,                Leg with IR femur and leg. Manual Tx addressed these deficits along this chain. Plan to continue to address this connected area next session . Since pelvic/ spine alignment has been addressed, and remaining issues at lower kinetic chain need to be addressed to help minimize  glut pain/ anterior hip pain.   Pt benefits from skilled PT.    OBJECTIVE IMPAIRMENTS decreased activity tolerance, decreased coordination, decreased endurance, decreased mobility, difficulty walking, decreased ROM, decreased strength, decreased safety awareness, hypomobility, increased muscle spasms, impaired flexibility, improper body mechanics, postural dysfunction, and pain.    ACTIVITY LIMITATIONS  self-care,  sleep, home chores, work tasks    PARTICIPATION LIMITATIONS:  community, gardening    PERSONAL FACTORS   scoliosis, recent dementia Dx, lives alone are factors also  affecting patient's  functional outcome.    REHAB POTENTIAL: Good   CLINICAL DECISION MAKING: Evolving/moderate complexity   EVALUATION COMPLEXITY: Moderate    PATIENT EDUCATION:    Education details: Showed pt anatomy images. Explained muscles attachments/ connection, physiology of deep core system/ spinal- thoracic-pelvis-lower kinetic chain as they relate to pt's presentation, Sx, and past Hx. Explained what and how these areas of deficits need to be restored to balance and function    See Therapeutic activity / neuromuscular re-education section  Answered pt's questions.   Person educated: Patient Education method: Explanation, Demonstration, Tactile cues, Verbal cues, and Handouts Education comprehension: verbalized understanding, returned demonstration, verbal cues required, tactile cues required, and needs further education     PLAN: PT FREQUENCY: 1x/week   PT DURATION: 10 weeks   PLANNED INTERVENTIONS: Therapeutic exercises, Therapeutic activity, Neuromuscular re-education, Balance training, Gait training, Patient/Family education, Self Care, Joint mobilization, Spinal mobilization, Moist heat, Taping, and Manual therapy, dry needling.   PLAN FOR NEXT SESSION: See clinical impression for plan     GOALS: Goals reviewed with patient? Yes  SHORT TERM GOALS: Target date: 08/29/2022    Pt will demo IND with HEP                    Baseline: Not IND            Goal status: INITIAL   LONG TERM GOALS: Target date: 10/10/2022   1.Pt will demo proper deep core coordination without chest breathing and optimal excursion of diaphragm/pelvic floor in order to promote spinal stability and pelvic floor function  Baseline: dyscoordination Goal status: INITIAL  2.  Pt will demo > 5 pt change on FOTO  to improve QOL and function   Pelvic Pain baseline -33  Bowel  constipation baseline -68 Bowel Leakage baseline -69    Goal status: INITIAL  3.  Pt will demo proper body  mechanics in against gravity tasks and ADLs  work tasks, fitness  to minimize straining pelvic floor / back                  Baseline: not IND, improper form that places strain on pelvic floor                Goal status: INITIAL    4. Pt will walk > 3000 steps before the onset of pain in order to maintain mobility   Baseline: Pt walks 4000 steps  in her yard but feels the pain coming on at 2000 steps  Goal status: INITIAL    5. Pt will be able drive and sit on non-soft cushions for > 30 min with 50% less glut pain in order to participate in community activities  Baseline:  driving and sitting on non-soft cushions caused R glut pain  Goal status: INITIAL   6. Pt will demo increased gait speed > 1.3 m/s in order to ambulate safely in community with decreased risk for falls   Baseline: 1.15 m/s, limited trunk rotation, decreased stance on L, thorax shifted to L) ,  Goal status: INITIAL                         7. Pt will demo levelled pelvic girdle or flank space B with equal fingers width to indicate thoracic shift to medial line and pt and sister will be IND with scoliosis -specific HEP in order to restore mobility at spine, pelvis, gait, posture  Baseline: scoliosis curve: flank space:  L 2 fingers, R 4 fingers width between low rib/ iliac crest.) ( thoracic kyphosis/ forward head : 21 cm from earlobe to wall )    Jerl Mina, PT 10/02/2022, 9:53 AM

## 2022-10-08 ENCOUNTER — Other Ambulatory Visit (HOSPITAL_COMMUNITY): Payer: Self-pay | Admitting: Physician Assistant

## 2022-10-08 DIAGNOSIS — R413 Other amnesia: Secondary | ICD-10-CM

## 2022-10-09 ENCOUNTER — Ambulatory Visit: Payer: Medicare HMO | Admitting: Physical Therapy

## 2022-10-09 ENCOUNTER — Encounter: Payer: Self-pay | Admitting: Physical Therapy

## 2022-10-09 DIAGNOSIS — M4125 Other idiopathic scoliosis, thoracolumbar region: Secondary | ICD-10-CM

## 2022-10-09 DIAGNOSIS — R2689 Other abnormalities of gait and mobility: Secondary | ICD-10-CM

## 2022-10-09 DIAGNOSIS — R278 Other lack of coordination: Secondary | ICD-10-CM

## 2022-10-09 NOTE — Patient Instructions (Signed)
Neck roll with towel   Instead of pillow for this exercise  __ Maintain head contact on bed for all movements  Chin up, chin down  Rotate ( turning head to change lanes)   Side bend (like puppy dog : ear to shoulder)    5 each

## 2022-10-09 NOTE — Therapy (Signed)
OUTPATIENT PHYSICAL THERAPY Treatment / [place]    Patient Name: Doris [name] MRN: [id number] DOB:[birth year], 72 y.o., female Today's Date: 10/09/2022   PT End of Session - 10/09/22 0936     Visit Number 8    Number of Visits 10    Date for PT Re-Evaluation 12/18/2022    PT Start Time 0934    PT Stop Time 1015    PT Time Calculation (min) 41 min    Activity Tolerance Patient tolerated treatment well    Behavior During Therapy [facility] for tasks assessed/performed             Past Medical History:  Diagnosis Date   A-fib ([place])    Cancer ([place])    Basal cell ( bridge of her nose)    CHF (congestive heart failure) (HCC)    Headache    Migraines   Hx of migraine headaches    Hypertension    Medical history non-contributory    Mitral valve prolapse at 19 yrs of age   Past Surgical History:  Procedure Laterality Date   CARDIAC CATHETERIZATION     CATARACT EXTRACTION  02/2017   COLONOSCOPY WITH PROPOFOL N/A 03/03/2017   Procedure: COLONOSCOPY WITH PROPOFOL;  Surgeon: Garlan Fair, MD;  Location: [facility];  Service: Endoscopy;  Laterality: N/A;   COLONOSCOPY WITH PROPOFOL N/A 02/18/2022   Procedure: COLONOSCOPY WITH PROPOFOL;  Surgeon: Lesly Rubenstein, MD;  Location: [facility] END[facility];  Service: Endoscopy;  Laterality: N/A;   OOPHORECTOMY  2005   Patient Active Problem List   Diagnosis Date Noted   Painful veins 08/07/2018    PCP: Lovie Macadamia MD    REFERRING PROVIDER:Schermerhorn MD  REFERRING DIAG: Pelvic Pain  Rationale for Evaluation and Treatment Rehabilitation  THERAPY DIAG:  Other idiopathic scoliosis, thoracolumbar region  Other abnormalities of gait and mobility  Other lack of coordination  ONSET DATE: 6+ months ago  SUBJECTIVE:                       SUBJECTIVE TODAY  Pt bumped into her dresser and since then she noticed pain down her R LBP to back of her knee.    R glut pain today is 6/10.  Pain at R by ASIS : 4/10   Pt also reports  R radiating pain down to outside leg to knee / leg is more noticeable. Foot is better .      SUBJECTIVE STATEMENT EVAl on 07/31/22             Sister present and answered some questions for pt.   1) R posterior glut pain started 6 + months ago. Driving stickshift , sitting on non-cushion chairs caused pain. Pain radiates to back of thigh started more recently. Pt walks 4000 steps  in her yard but feels the pain coming on at 2000 steps . Pt used to do 8000 steps prior to this pain.  o 3/10. Currently sitting on plinth 5/10.   Pain 6/10 2) sometimes pain at R by ASIS : 4-5/10 , pt thinks it is related to bowel movements. Pt has Type 1 , 3 stool consistency.  Drinks 3-4 glasses a day. 2 glasses juice, herbal tea, coffee.    PERTINENT HISTORY:  Scoliosis, CA skin, A-fib/ CHF, tachycardia , pacemaker ( total  7 different heart conditions) , recent dementia Dx, twin has Alzheimers   PAIN:  Are you having pain? See above  PRECAUTIONS: No  WEIGHT BEARING RESTRICTIONS: No  FALLS:  Has patient fallen in last 6 months? Yes, fell on the grass   LIVING ENVIRONMENT: Lives with: lives alone  Lives in: House/apartment Stairs: Yes STE 3 with rail only in the back  Has following equipment at home: No   OCCUPATION: retired Pharmacist, hospital , hobbies: gardening.   PLOF: Independent  PATIENT GOALS:   Not have pain, walk without pain    OBJECTIVE:     [facility] PT Assessment - 10/09/22 0944       Observation/Other Assessments   Scoliosis (less scoliosis curve: flank space: L 3 fingers, R 3 fingers width between low rib/ iliac crest.)      AROM   Overall AROM Comments B trunk rotation reproduced the radiating pain to back of R knee, less pain qwhen therapist provided tactile pressure to lengthen spine to decrease thoracic kyphosis  '   post Tx: L rotation , pain to glut centralized from back of knee, R rotation: no pain     Palpation   SI assessment  R iliac crest higher,    Palpation comment  deviated T2,3 L, interspinals tightness L,      Ambulation/Gait   Gait Comments longer strides, much less thoracic shift to L,               [facility] Adult PT Treatment/Exercise - 10/09/22 0944       Neuro Re-ed    Neuro Re-ed Details  cued for 6 directions of spine, log rolling      Modalities   Modalities Moist Heat      Moist Heat Therapy   Number Minutes Moist Heat 5 Minutes    Moist Heat Location --   C/T junction     Manual Therapy   Manual therapy comments medial glide at cerviical and T-spine to realign neck ,              HOME EXERCISE PROGRAM: See pt instruction section    ASSESSMENT:  CLINICAL IMPRESSION:               Pt has met 1/7 goals and progressing gradually towards remaining goals.  Pt 's scoliosis alignment and thoracic kyphosis is improving. Pt's thoracic shift to L has improved since last session. These structural changes has enhanced her gait speed and pt demo'd longer stride which will help minimize fall risks.    Today focused on addressing the deviations at C/T junction with manual Tx whichpt tolerated without pain. Pt showed centralization of R sciatic pain from back of knee to glut after this realignment.  Provided neck ROM for HEP.                 Plan to reassessed lower kinetic chain and apply  regional interdependent approach given pt's severe scoliosis. Pt showed hallus valgus bilaterally with L foot > R and R leg with significant tightness at midfoot joints/ hamstrings which will help with her R glut/ hip pain and pelvic issues.  Pt benefits from skilled PT.    OBJECTIVE IMPAIRMENTS decreased activity tolerance, decreased coordination, decreased endurance, decreased mobility, difficulty walking, decreased ROM, decreased strength, decreased safety awareness, hypomobility, increased muscle spasms, impaired flexibility, improper body mechanics, postural dysfunction, and pain.    ACTIVITY LIMITATIONS  self-care,  sleep, home chores, work  tasks    PARTICIPATION LIMITATIONS:  community, gardening    PERSONAL FACTORS   scoliosis, recent dementia Dx, lives alone are factors also affecting patient's functional outcome.    REHAB POTENTIAL: Good  CLINICAL DECISION MAKING: Evolving/moderate complexity   EVALUATION COMPLEXITY: Moderate    PATIENT EDUCATION:    Education details: Showed pt anatomy images. Explained muscles attachments/ connection, physiology of deep core system/ spinal- thoracic-pelvis-lower kinetic chain as they relate to pt's presentation, Sx, and past Hx. Explained what and how these areas of deficits need to be restored to balance and function    See Therapeutic activity / neuromuscular re-education section  Answered pt's questions.   Person educated: Patient Education method: Explanation, Demonstration, Tactile cues, Verbal cues, and Handouts Education comprehension: verbalized understanding, returned demonstration, verbal cues required, tactile cues required, and needs further education     PLAN: PT FREQUENCY: 1x/week   PT DURATION: 10 weeks   PLANNED INTERVENTIONS: Therapeutic exercises, Therapeutic activity, Neuromuscular re-education, Balance training, Gait training, Patient/Family education, Self Care, Joint mobilization, Spinal mobilization, Moist heat, Taping, and Manual therapy, dry needling.   PLAN FOR NEXT SESSION: See clinical impression for plan     GOALS: Goals reviewed with patient? Yes  SHORT TERM GOALS: Target date: 08/29/2022    Pt will demo IND with HEP                    Baseline: Not IND            Goal status: MET    LONG TERM GOALS: Target date:  12/18/2022    1.Pt will demo proper deep core coordination without chest breathing and optimal excursion of diaphragm/pelvic floor in order to promote spinal stability and pelvic floor function  Baseline: dyscoordination Goal status:  On going   2.  Pt will demo > 5 pt change on FOTO  to improve QOL and  function   Pelvic Pain baseline -33  Bowel  constipation baseline -68 Bowel Leakage baseline -69    Goal status:Ongoing   3.  Pt will demo proper body mechanics in against gravity tasks and ADLs  work tasks, fitness  to minimize straining pelvic floor / back                  Baseline: not IND, improper form that places strain on pelvic floor                Goal status:  On going     4. Pt will walk > 3000 steps before the onset of pain in order to maintain mobility   Baseline: Pt walks 4000 steps  in her yard but feels the pain coming on at 2000 steps  Goal status: On going     5. Pt will be able drive and sit on non-soft cushions for > 30 min with 50% less glut pain in order to participate in community activities  Baseline:  driving and sitting on non-soft cushions caused R glut pain  Goal status: On going   6. Pt will demo increased gait speed > 1.3 m/s in order to ambulate safely in community with decreased risk for falls   Baseline: 1.15 m/s, limited trunk rotation, decreased stance on L, thorax shifted to L) ,  Goal status: MET                         7. Pt will demo levelled pelvic girdle or flank space B with equal fingers width to indicate thoracic shift to medial line and pt and sister will be IND with scoliosis -specific HEP in order to restore mobility at spine, pelvis, gait, posture  Baseline: scoliosis curve:  flank space: L 2 fingers, R 4 fingers width between low rib/ iliac crest.) ( thoracic kyphosis/ forward head : 21 cm from earlobe to wall )  Goal Status: PARTIALLY  ( 3 fingers B at flank space, 10/09/22) ( worked on decreasing forward head posture today)    Jerl Mina, PT 10/09/2022, 9:38 AM

## 2022-10-13 ENCOUNTER — Ambulatory Visit (HOSPITAL_COMMUNITY)
Admission: RE | Admit: 2022-10-13 | Discharge: 2022-10-13 | Disposition: A | Discharge status: Home / Self Care | Payer: Medicare HMO | Source: Ambulatory Visit | Attending: Orthopaedic Surgery | Admitting: Orthopaedic Surgery

## 2022-10-13 DIAGNOSIS — M5441 Lumbago with sciatica, right side: Secondary | ICD-10-CM | POA: Insufficient documentation

## 2022-10-13 DIAGNOSIS — G8929 Other chronic pain: Secondary | ICD-10-CM | POA: Diagnosis not present

## 2022-10-13 DIAGNOSIS — M48061 Spinal stenosis, lumbar region without neurogenic claudication: Secondary | ICD-10-CM | POA: Diagnosis not present

## 2022-10-13 DIAGNOSIS — R413 Other amnesia: Secondary | ICD-10-CM | POA: Insufficient documentation

## 2022-10-13 NOTE — Progress Notes (Signed)
Patient here today at [facility] for MRI brain wo contrast and Lumbar spine wo contrast. Patient has medtronic device followed by [facility]. CLE sent. Orders received for DOO 95. Will re program once scan is completed.

## 2022-10-16 ENCOUNTER — Ambulatory Visit: Payer: Medicare HMO | Admitting: Physical Therapy

## 2022-10-16 DIAGNOSIS — M4125 Other idiopathic scoliosis, thoracolumbar region: Secondary | ICD-10-CM

## 2022-10-16 DIAGNOSIS — R278 Other lack of coordination: Secondary | ICD-10-CM

## 2022-10-16 DIAGNOSIS — R2689 Other abnormalities of gait and mobility: Secondary | ICD-10-CM | POA: Diagnosis not present

## 2022-10-16 NOTE — Addendum Note (Signed)
Addended by: Jerl Mina on: 10/16/2022 09:48 AM   Modules accepted: Orders

## 2022-10-16 NOTE — Therapy (Addendum)
OUTPATIENT PHYSICAL THERAPY Treatment    Patient Name: Doris [name] MRN: [id number] DOB:10-04-1950, 72 y.o., female Today's Date: 10/16/2022   PT End of Session - 10/16/22 0936     Visit Number 9    Number of Visits 18    Date for PT Re-Evaluation 12/18/2022    PT Start Time 0940   PT Stop Time 1022   PT Time Calculation (min) 42 min    Activity Tolerance Patient tolerated treatment well    Behavior During Therapy [facility] for tasks assessed/performed             Past Medical History:  Diagnosis Date   A-fib ([place])    Cancer ([place])    Basal cell ( bridge of her nose)    CHF (congestive heart failure) (HCC)    Headache    Migraines   Hx of migraine headaches    Hypertension    Medical history non-contributory    Mitral valve prolapse at 19 yrs of age   Past Surgical History:  Procedure Laterality Date   CARDIAC CATHETERIZATION     CATARACT EXTRACTION  02/2017   COLONOSCOPY WITH PROPOFOL N/A 03/03/2017   Procedure: COLONOSCOPY WITH PROPOFOL;  Surgeon: Garlan Fair, MD;  Location: [facility];  Service: Endoscopy;  Laterality: N/A;   COLONOSCOPY WITH PROPOFOL N/A 02/18/2022   Procedure: COLONOSCOPY WITH PROPOFOL;  Surgeon: Lesly Rubenstein, MD;  Location: [facility] ENDOSCOPY;  Service: Endoscopy;  Laterality: N/A;   OOPHORECTOMY  2005   Patient Active Problem List   Diagnosis Date Noted   Painful veins 08/07/2018    PCP: Lovie Macadamia MD    REFERRING PROVIDER:Schermerhorn MD  REFERRING DIAG: Pelvic Pain  Rationale for Evaluation and Treatment Rehabilitation  THERAPY DIAG:  Other idiopathic scoliosis, thoracolumbar region   Other abnormalities of gait and mobility   Other lack of coordination  ONSET DATE: 6+ months ago  SUBJECTIVE:                       SUBJECTIVE TODAY  Pt's sister reported that pt said she had no pain after last session but the pain relief lasted an hour.   Today , pelvic pain is still the same as the beginning of eval 5/10.    Radiating pain down RLE to knee across last week has decreased. It is exacerbated with  bowel movements with burning pain by R vulva.    SUBJECTIVE STATEMENT EVAl on 07/31/22             Sister present and answered some questions for pt.   1) R posterior glut pain started 6 + months ago. Driving stickshift , sitting on non-cushion chairs caused pain. Pain radiates to back of thigh started more recently. Pt walks 4000 steps  in her yard but feels the pain coming on at 2000 steps . Pt used to do 8000 steps prior to this pain.  o 3/10. Currently sitting on plinth 5/10.   Pain 6/10 2) sometimes pain at R by ASIS : 4-5/10 , pt thinks it is related to bowel movements. Pt has Type 1 , 3 stool consistency.  Drinks 3-4 glasses a day. 2 glasses juice, herbal tea, coffee.    PERTINENT HISTORY:  Scoliosis, CA skin, A-fib/ CHF, tachycardia , pacemaker ( total  7 different heart conditions) , recent dementia Dx, twin has Alzheimers   PAIN:  Are you having pain? See above  PRECAUTIONS: No  WEIGHT BEARING RESTRICTIONS: No  FALLS:  Has patient fallen in last 6 months? Yes, fell on the grass   LIVING ENVIRONMENT: Lives with: lives alone  Lives in: House/apartment Stairs: Yes STE 3 with rail only in the back  Has following equipment at home: No   OCCUPATION: retired Pharmacist, hospital , hobbies: gardening.   PLOF: Independent  PATIENT GOALS:   Not have pain, walk without pain    OBJECTIVE:   [facility] PT Assessment - 10/16/22 0955       Ambulation/Gait   Gait Comments less trunk lean L, decreased WBing on R LE             [facility] Adult PT Treatment/Exercise - 10/16/22 0955       Neuro Re-ed    Neuro Re-ed Details  cued for 6 directions      Modalities   Modalities Moist Heat      Moist Heat Therapy   Number Minutes Moist Heat 3 Minutes    Moist Heat Location --   ( unbilled)  at C/T junction     Manual Therapy   Manual therapy comments STM/MWM at problem areas noted in assessment to  promote medial alignment of L thorax , optimize diaphragmatic excursion               HOME EXERCISE PROGRAM: See pt instruction section    ASSESSMENT:  CLINICAL IMPRESSION:                             Pt continues to require manual Tx at L thoracic scoliotic curve and cervical spine. Pt is gradually more medially aligned and with less forward head.  Pt was cued for neck ROM HEP.  Plan to address pelvic floor at next session.                Pt benefits from skilled PT.    OBJECTIVE IMPAIRMENTS decreased activity tolerance, decreased coordination, decreased endurance, decreased mobility, difficulty walking, decreased ROM, decreased strength, decreased safety awareness, hypomobility, increased muscle spasms, impaired flexibility, improper body mechanics, postural dysfunction, and pain.    ACTIVITY LIMITATIONS  self-care,  sleep, home chores, work tasks    PARTICIPATION LIMITATIONS:  community, gardening    PERSONAL FACTORS   scoliosis, recent dementia Dx, lives alone are factors also affecting patient's functional outcome.    REHAB POTENTIAL: Good   CLINICAL DECISION MAKING: Evolving/moderate complexity   EVALUATION COMPLEXITY: Moderate    PATIENT EDUCATION:    Education details: Showed pt anatomy images. Explained muscles attachments/ connection, physiology of deep core system/ spinal- thoracic-pelvis-lower kinetic chain as they relate to pt's presentation, Sx, and past Hx. Explained what and how these areas of deficits need to be restored to balance and function    See Therapeutic activity / neuromuscular re-education section  Answered pt's questions.   Person educated: Patient Education method: Explanation, Demonstration, Tactile cues, Verbal cues, and Handouts Education comprehension: verbalized understanding, returned demonstration, verbal cues required, tactile cues required, and needs further education     PLAN: PT FREQUENCY: 1x/week   PT DURATION: 10  weeks   PLANNED INTERVENTIONS: Therapeutic exercises, Therapeutic activity, Neuromuscular re-education, Balance training, Gait training, Patient/Family education, Self Care, Joint mobilization, Spinal mobilization, Moist heat, Taping, and Manual therapy, dry needling.   PLAN FOR NEXT SESSION: See clinical impression for plan     GOALS: Goals reviewed with patient? Yes  SHORT TERM GOALS: Target date: 08/29/2022    Pt will demo IND with HEP  Baseline: Not IND            Goal status: MET    LONG TERM GOALS: Target date:  12/18/2022    1.Pt will demo proper deep core coordination without chest breathing and optimal excursion of diaphragm/pelvic floor in order to promote spinal stability and pelvic floor function  Baseline: dyscoordination Goal status:  On going   2.  Pt will demo > 5 pt change on FOTO  to improve QOL and function   Pelvic Pain baseline -33  Bowel  constipation baseline -68 Bowel Leakage baseline -69    Goal status:Ongoing   3.  Pt will demo proper body mechanics in against gravity tasks and ADLs  work tasks, fitness  to minimize straining pelvic floor / back                  Baseline: not IND, improper form that places strain on pelvic floor                Goal status:  On going     4. Pt will walk > 3000 steps before the onset of pain in order to maintain mobility   Baseline: Pt walks 4000 steps  in her yard but feels the pain coming on at 2000 steps  Goal status: On going     5. Pt will be able drive and sit on non-soft cushions for > 30 min with 50% less glut pain in order to participate in community activities  Baseline:  driving and sitting on non-soft cushions caused R glut pain  Goal status: On going   6. Pt will demo increased gait speed > 1.3 m/s in order to ambulate safely in community with decreased risk for falls   Baseline: 1.15 m/s, limited trunk rotation, decreased stance on L, thorax shifted to L) ,  Goal  status: MET                         7. Pt will demo levelled pelvic girdle or flank space B with equal fingers width to indicate thoracic shift to medial line and pt and sister will be IND with scoliosis -specific HEP in order to restore mobility at spine, pelvis, gait, posture  Baseline: scoliosis curve: flank space: L 2 fingers, R 4 fingers width between low rib/ iliac crest.) ( thoracic kyphosis/ forward head : 21 cm from earlobe to wall )  Goal Status: PARTIALLY  ( 3 fingers B at flank space, 10/09/22) ( worked on decreasing forward head posture today)    Jerl Mina, PT 10/16/2022, 9:44 AM

## 2022-10-23 ENCOUNTER — Ambulatory Visit: Payer: Medicare HMO | Attending: Obstetrics and Gynecology | Admitting: Physical Therapy

## 2022-10-23 DIAGNOSIS — R2689 Other abnormalities of gait and mobility: Secondary | ICD-10-CM

## 2022-10-23 DIAGNOSIS — M4125 Other idiopathic scoliosis, thoracolumbar region: Secondary | ICD-10-CM | POA: Diagnosis not present

## 2022-10-23 DIAGNOSIS — R278 Other lack of coordination: Secondary | ICD-10-CM | POA: Diagnosis not present

## 2022-10-23 NOTE — Therapy (Addendum)
OUTPATIENT PHYSICAL THERAPY Treatment /Progress Note across 10 visits from  07/31/22 to 10/23/22    Patient Name: Doris [name] MRN: [id number] DOB:07-25-1951, 72 y.o., female Today's Date: 10/23/2022   PT End of Session - 10/23/22 1001     Visit Number 10    Number of Visits 18    Date for PT Re-Evaluation 12/18/22    PT Start Time 0938   PT Stop Time 1016   PT Time Calculation (min) 38 min    Activity Tolerance Patient tolerated treatment well    Behavior During Therapy [facility] for tasks assessed/performed             Past Medical History:  Diagnosis Date   A-fib ([place])    Cancer ([place])    Basal cell ( bridge of her nose)    CHF (congestive heart failure) (HCC)    Headache    Migraines   Hx of migraine headaches    Hypertension    Medical history non-contributory    Mitral valve prolapse at 19 yrs of age   Past Surgical History:  Procedure Laterality Date   CARDIAC CATHETERIZATION     CATARACT EXTRACTION  02/2017   COLONOSCOPY WITH PROPOFOL N/A 03/03/2017   Procedure: COLONOSCOPY WITH PROPOFOL;  Surgeon: Garlan Fair, MD;  Location: [facility];  Service: Endoscopy;  Laterality: N/A;   COLONOSCOPY WITH PROPOFOL N/A 02/18/2022   Procedure: COLONOSCOPY WITH PROPOFOL;  Surgeon: Lesly Rubenstein, MD;  Location: [facility] END[facility]COPY;  Service: Endoscopy;  Laterality: N/A;   OOPHORECTOMY  2005   Patient Active Problem List   Diagnosis Date Noted   Painful veins 08/07/2018    PCP: Lovie Macadamia MD    REFERRING PROVIDER:Schermerhorn MD  REFERRING DIAG: Pelvic Pain  Rationale for Evaluation and Treatment Rehabilitation  THERAPY DIAG:  Other idiopathic scoliosis, thoracolumbar region   Other abnormalities of gait and mobility   Other lack of coordination  ONSET DATE: 6+ months ago  SUBJECTIVE:                       SUBJECTIVE TODAY  Pt reports pelvic pain has not increased but it comes on and off.  Scoliosis related pain is better.     SUBJECTIVE  STATEMENT EVAl on 07/31/22             Sister present and answered some questions for pt.   1) R posterior glut pain started 6 + months ago. Driving stickshift , sitting on non-cushion chairs caused pain. Pain radiates to back of thigh started more recently. Pt walks 4000 steps  in her yard but feels the pain coming on at 2000 steps . Pt used to do 8000 steps prior to this pain.  o 3/10. Currently sitting on plinth 5/10.   Pain 6/10 2) sometimes pain at R by ASIS : 4-5/10 , pt thinks it is related to bowel movements. Pt has Type 1 , 3 stool consistency.  Drinks 3-4 glasses a day. 2 glasses juice, herbal tea, coffee.    PERTINENT HISTORY:  Scoliosis, CA skin, A-fib/ CHF, tachycardia , pacemaker ( total  7 different heart conditions) , recent dementia Dx, twin has Alzheimers   PAIN:  Are you having pain? See above  PRECAUTIONS: No  WEIGHT BEARING RESTRICTIONS: No  FALLS:  Has patient fallen in last 6 months? Yes, fell on the grass   LIVING ENVIRONMENT: Lives with: lives alone  Lives in: House/apartment Stairs: Yes STE 3 with rail only in  the back  Has following equipment at home: No   OCCUPATION: retired Pharmacist, hospital , hobbies: gardening.   PLOF: Independent  PATIENT GOALS:   Not have pain, walk without pain    OBJECTIVE:      [facility] PT Assessment - 10/23/22 1502       Palpation   Palpation comment tightness at scalenes/ upper trap, L             Pelvic Floor Special Questions - 10/23/22 1111     External Perineal Exam through clothing: tightness along ischial rami mm attachments R tightness and tenderness              [facility] Adult PT Treatment/Exercise - 10/23/22 1500       Neuro Re-ed    Neuro Re-ed Details  cued for hip extension L standing with hand on counter, cued with tactile and verbal/ visual cues      Modalities   Modalities Moist Heat      Moist Heat Therapy   Number Minutes Moist Heat 3 Minutes    Moist Heat Location --   butterfly     Manual  Therapy   Manual therapy comments STM/MWM at R pelvic floor noted assessment to promote ER of thigh/ tibia               HOME EXERCISE PROGRAM: See pt instruction section    ASSESSMENT:  CLINICAL IMPRESSION:                              Pt has met 1/7 goals and progressing gradually towards remaining goals.  Pt 's scoliosis alignment and thoracic kyphosis is improving. Pt's thoracic shift to L has improved since last session. These structural changes has enhanced her gait speed and pt demo'd longer stride which will help minimize fall risks. Posterior glut pain / R LE radiating to knee has improved slowly.   Pelvic pain is still being addressed with today's manual Tx at R pelvic floor mm tightness.  Anticipate R pelvic floor tightness and pain will improve as L thoracic spine is corrected with medial alignment.   Continued addressing the deviations at C/T junction with manual Tx which pt tolerated without pain. Pt reports more centralization of R sciatic pain from back of knee to glut after this realignment. Pt continues to show less thoracic shift to L and less forward head posture but will still need more manual Tx to medially align scoliotic spine.                 Plan to continue to realign spine, address lower kinetic chain, and apply regional interdependent approach given pt's severe scoliosis.  Pt required cues for   Recent dementia Dx is a factor in her prognosis. So far, pt is maintaining compliance.  Continue to film HEP on pt's phone which she rewatches. Friend was present today and provided cues for pt. Pt was not able to find clinic room after using bathroom.   Pt benefits from skilled PT.    OBJECTIVE IMPAIRMENTS decreased activity tolerance, decreased coordination, decreased endurance, decreased mobility, difficulty walking, decreased ROM, decreased strength, decreased safety awareness, hypomobility, increased muscle spasms, impaired flexibility, improper body  mechanics, postural dysfunction, and pain.    ACTIVITY LIMITATIONS  self-care,  sleep, home chores, work tasks    PARTICIPATION LIMITATIONS:  community, gardening    PERSONAL FACTORS   scoliosis, recent dementia Dx, lives alone are factors  also affecting patient's functional outcome.    REHAB POTENTIAL: Good   CLINICAL DECISION MAKING: Evolving/moderate complexity   EVALUATION COMPLEXITY: Moderate    PATIENT EDUCATION:    Education details: Showed pt anatomy images. Explained muscles attachments/ connection, physiology of deep core system/ spinal- thoracic-pelvis-lower kinetic chain as they relate to pt's presentation, Sx, and past Hx. Explained what and how these areas of deficits need to be restored to balance and function    See Therapeutic activity / neuromuscular re-education section  Answered pt's questions.   Person educated: Patient Education method: Explanation, Demonstration, Tactile cues, Verbal cues, and Handouts Education comprehension: verbalized understanding, returned demonstration, verbal cues required, tactile cues required, and needs further education     PLAN: PT FREQUENCY: 1x/week   PT DURATION: 10 weeks   PLANNED INTERVENTIONS: Therapeutic exercises, Therapeutic activity, Neuromuscular re-education, Balance training, Gait training, Patient/Family education, Self Care, Joint mobilization, Spinal mobilization, Moist heat, Taping, and Manual therapy, dry needling.   PLAN FOR NEXT SESSION: See clinical impression for plan     GOALS: Goals reviewed with patient? Yes  SHORT TERM GOALS: Target date: 08/29/2022    Pt will demo IND with HEP                    Baseline: Not IND            Goal status: MET    LONG TERM GOALS: Target date:  12/18/2022    1.Pt will demo proper deep core coordination without chest breathing and optimal excursion of diaphragm/pelvic floor in order to promote spinal stability and pelvic floor function  Baseline:  dyscoordination Goal status:  On going   2.  Pt will demo > 5 pt change on FOTO  to improve QOL and function   Pelvic Pain baseline -33  Bowel  constipation baseline -68 Bowel Leakage baseline -69    Goal status:Ongoing   3.  Pt will demo proper body mechanics in against gravity tasks and ADLs  work tasks, fitness  to minimize straining pelvic floor / back                  Baseline: not IND, improper form that places strain on pelvic floor                Goal status:  On going     4. Pt will walk > 3000 steps before the onset of pain in order to maintain mobility   Baseline: Pt walks 4000 steps  in her yard but feels the pain coming on at 2000 steps  Goal status: On going     5. Pt will be able drive and sit on non-soft cushions for > 30 min with 50% less glut pain in order to participate in community activities  Baseline:  driving and sitting on non-soft cushions caused R glut pain  Goal status: On going   6. Pt will demo increased gait speed > 1.3 m/s in order to ambulate safely in community with decreased risk for falls   Baseline: 1.15 m/s, limited trunk rotation, decreased stance on L, thorax shifted to L) ,  Goal status: MET                         7. Pt will demo levelled pelvic girdle or flank space B with equal fingers width to indicate thoracic shift to medial line and pt and sister will be IND with scoliosis -specific HEP in  order to restore mobility at spine, pelvis, gait, posture  Baseline: scoliosis curve: flank space: L 2 fingers, R 4 fingers width between low rib/ iliac crest.) ( thoracic kyphosis/ forward head : 21 cm from earlobe to wall )  Goal Status: PARTIALLY  ( 3 fingers B at flank space, 10/09/22) ( worked on decreasing forward head posture today)    Jerl Mina, PT 10/23/2022, 10:04 AM

## 2022-10-23 NOTE — Patient Instructions (Addendum)
L hand at kitchen counter  L hip tap back 20 reps   R hand at kitchen counter, L hip tap back 20 reps __  __  Butterfly pose with pillows under knees 5 min  to stretch pelvic floor

## 2022-10-29 DIAGNOSIS — G479 Sleep disorder, unspecified: Secondary | ICD-10-CM | POA: Diagnosis not present

## 2022-10-29 DIAGNOSIS — F411 Generalized anxiety disorder: Secondary | ICD-10-CM | POA: Diagnosis not present

## 2022-10-29 DIAGNOSIS — R413 Other amnesia: Secondary | ICD-10-CM | POA: Diagnosis not present

## 2022-10-30 ENCOUNTER — Ambulatory Visit: Payer: Medicare HMO | Admitting: Physical Therapy

## 2022-10-30 DIAGNOSIS — M4125 Other idiopathic scoliosis, thoracolumbar region: Secondary | ICD-10-CM | POA: Diagnosis not present

## 2022-10-30 DIAGNOSIS — R2689 Other abnormalities of gait and mobility: Secondary | ICD-10-CM | POA: Diagnosis not present

## 2022-10-30 DIAGNOSIS — R278 Other lack of coordination: Secondary | ICD-10-CM

## 2022-10-30 NOTE — Therapy (Addendum)
OUTPATIENT PHYSICAL THERAPY Treatment  Patient Name: Doris [name] MRN: [id number] DOB:02-16-1951, 72 y.o., female Today's Date: 11/01/2022   PT End of Session - 10/30/22     Visit Number 11    Number of Visits 18    Date for PT Re-Evaluation 12/18/22    Activity Tolerance Patient tolerated treatment well    Behavior During Therapy [facility] for tasks assessed/performed           Time in: 09:40  , end of session  10:20  Duration 40 min    Past Medical History:  Diagnosis Date   A-fib ([place])    Cancer ([place])    Basal cell ( bridge of her nose)    CHF (congestive heart failure) ([place])    Headache    Migraines   Hx of migraine headaches    Hypertension    Medical history non-contributory    Mitral valve prolapse at 19 yrs of age   Past Surgical History:  Procedure Laterality Date   CARDIAC CATHETERIZATION     CATARACT EXTRACTION  02/2017   COLONOSCOPY WITH PROPOFOL N/A 03/03/2017   Procedure: COLONOSCOPY WITH PROPOFOL;  Surgeon: Garlan Fair, MD;  Location: [facility];  Service: Endoscopy;  Laterality: N/A;   COLONOSCOPY WITH PROPOFOL N/A 02/18/2022   Procedure: COLONOSCOPY WITH PROPOFOL;  Surgeon: Lesly Rubenstein, MD;  Location: [facility] END[facility]COPY;  Service: Endoscopy;  Laterality: N/A;   OOPHORECTOMY  2005   Patient Active Problem List   Diagnosis Date Noted   Painful veins 08/07/2018    PCP: Lovie Macadamia MD    REFERRING PROVIDER:Schermerhorn MD  REFERRING DIAG: Pelvic Pain  Rationale for Evaluation and Treatment Rehabilitation  THERAPY DIAG:  Other idiopathic scoliosis, thoracolumbar region   Other abnormalities of gait and mobility   Other lack of coordination  ONSET DATE: 6+ months ago  SUBJECTIVE:                       SUBJECTIVE TODAY  Pt reports radiating pain has not been occurring as often and has been staying at gluts and less going down her leg   SUBJECTIVE STATEMENT EVAl on 07/31/22             Sister present and answered some  questions for pt.   1) R posterior glut pain started 6 + months ago. Driving stickshift , sitting on non-cushion chairs caused pain. Pain radiates to back of thigh started more recently. Pt walks 4000 steps  in her yard but feels the pain coming on at 2000 steps . Pt used to do 8000 steps prior to this pain.  o 3/10. Currently sitting on plinth 5/10.   Pain 6/10 2) sometimes pain at R by ASIS : 4-5/10 , pt thinks it is related to bowel movements. Pt has Type 1 , 3 stool consistency.  Drinks 3-4 glasses a day. 2 glasses juice, herbal tea, coffee.    PERTINENT HISTORY:  Scoliosis, CA skin, A-fib/ CHF, tachycardia , pacemaker ( total  7 different heart conditions) , recent dementia Dx, twin has Alzheimers   PAIN:  Are you having pain? See above  PRECAUTIONS: No  WEIGHT BEARING RESTRICTIONS: No  FALLS:  Has patient fallen in last 6 months? Yes, fell on the grass   LIVING ENVIRONMENT: Lives with: lives alone  Lives in: House/apartment Stairs: Yes STE 3 with rail only in the back  Has following equipment at home: No   OCCUPATION: retired Pharmacist, hospital , hobbies: gardening.  PLOF: Independent  PATIENT GOALS:   Not have pain, walk without pain    OBJECTIVE:   [facility] PT Assessment -        Palpation   Spinal mobility tightness along intercostals L, interspinals/  paraspinals/ medial scapula , hypomobile T3-7              [facility] Adult PT Treatment/Exercise -       Therapeutic Activites    Other Therapeutic Activities Discussed with pt's sister in law who was present today and with pt to contact PCP about her ingrown toe nail which can be a falls risk as pt had to wear her bedroom slipper today since her regular shoes were too tight on her with the ingrown toenail      Neuro Re-ed    Neuro Re-ed Details  reviewed past HEP for standing glut strengthening and scoliosis      Modalities   Modalities Moist Heat      Moist Heat Therapy   Moist Heat Location --   posterior trunk  rotation in supported pillows     Manual Therapy   Manual therapy comments STM/MWM to realign rotation of scoliosis on L thorax                HOME EXERCISE PROGRAM: See pt instruction section    ASSESSMENT:  CLINICAL IMPRESSION:                              Past Tx helped ot centralize her radiating pain at R pelvic floor. Further addressed R ischal rami mm attachments today which pt reported provided relief of pain. Pt demo'd les tightness at this area.                 Pt 's scoliosis alignment and thoracic kyphosis is improving. Pt's thoracic shift to L required more manual Tx today to straighten and minimize posterior rotation.   Anticipate R pelvic floor tightness and pain will improve as L thoracic spine is corrected with medial alignment.   Continued addressing the deviations at C/T junction with manual Tx which pt tolerated without pain.                Plan to continue to realign spine, address lower kinetic chain, and apply regional interdependent approach given pt's severe scoliosis.    Recent dementia Dx is a factor in her prognosis. So far, pt is maintaining compliance.  Continue to film HEP on pt's phone which she rewatches. Sister in law was present today.    Discussed with pt's sister in law who was present today and with pt to contact PCP about her ingrown toe nail which can be a falls risk as pt had to wear her bedroom slipper today since her regular shoes were too tight on her with the ingrown toenail. Pt and sister-in-law voiced agreement. Pt was advised to find Crocs shoes which have a ankle strap and better toe box space with sole traction and advised to not wear her bedroom slipper due to poor traction and fall risk.   Pt benefits from skilled PT.    OBJECTIVE IMPAIRMENTS decreased activity tolerance, decreased coordination, decreased endurance, decreased mobility, difficulty walking, decreased ROM, decreased strength, decreased safety awareness,  hypomobility, increased muscle spasms, impaired flexibility, improper body mechanics, postural dysfunction, and pain.    ACTIVITY LIMITATIONS  self-care,  sleep, home chores, work tasks    PARTICIPATION LIMITATIONS:  community,  gardening    PERSONAL FACTORS   scoliosis, recent dementia Dx, lives alone are factors also affecting patient's functional outcome.    REHAB POTENTIAL: Good   CLINICAL DECISION MAKING: Evolving/moderate complexity   EVALUATION COMPLEXITY: Moderate    PATIENT EDUCATION:    Education details: Showed pt anatomy images. Explained muscles attachments/ connection, physiology of deep core system/ spinal- thoracic-pelvis-lower kinetic chain as they relate to pt's presentation, Sx, and past Hx. Explained what and how these areas of deficits need to be restored to balance and function    See Therapeutic activity / neuromuscular re-education section  Answered pt's questions.   Person educated: Patient Education method: Explanation, Demonstration, Tactile cues, Verbal cues, and Handouts Education comprehension: verbalized understanding, returned demonstration, verbal cues required, tactile cues required, and needs further education     PLAN: PT FREQUENCY: 1x/week   PT DURATION: 10 weeks   PLANNED INTERVENTIONS: Therapeutic exercises, Therapeutic activity, Neuromuscular re-education, Balance training, Gait training, Patient/Family education, Self Care, Joint mobilization, Spinal mobilization, Moist heat, Taping, and Manual therapy, dry needling.   PLAN FOR NEXT SESSION: See clinical impression for plan     GOALS: Goals reviewed with patient? Yes  SHORT TERM GOALS: Target date: 08/29/2022    Pt will demo IND with HEP                    Baseline: Not IND            Goal status: MET    LONG TERM GOALS: Target date:  12/18/2022    1.Pt will demo proper deep core coordination without chest breathing and optimal excursion of diaphragm/pelvic floor in order to  promote spinal stability and pelvic floor function  Baseline: dyscoordination Goal status:  On going   2.  Pt will demo > 5 pt change on FOTO  to improve QOL and function   Pelvic Pain baseline -33  Bowel  constipation baseline -68 Bowel Leakage baseline -69    Goal status:Ongoing   3.  Pt will demo proper body mechanics in against gravity tasks and ADLs  work tasks, fitness  to minimize straining pelvic floor / back                  Baseline: not IND, improper form that places strain on pelvic floor                Goal status:  On going     4. Pt will walk > 3000 steps before the onset of pain in order to maintain mobility   Baseline: Pt walks 4000 steps  in her yard but feels the pain coming on at 2000 steps  Goal status: On going     5. Pt will be able drive and sit on non-soft cushions for > 30 min with 50% less glut pain in order to participate in community activities  Baseline:  driving and sitting on non-soft cushions caused R glut pain  Goal status: On going   6. Pt will demo increased gait speed > 1.3 m/s in order to ambulate safely in community with decreased risk for falls   Baseline: 1.15 m/s, limited trunk rotation, decreased stance on L, thorax shifted to L) ,  Goal status: MET                         7. Pt will demo levelled pelvic girdle or flank space B with equal fingers width to indicate thoracic  shift to medial line and pt and sister will be IND with scoliosis -specific HEP in order to restore mobility at spine, pelvis, gait, posture  Baseline: scoliosis curve: flank space: L 2 fingers, R 4 fingers width between low rib/ iliac crest.) ( thoracic kyphosis/ forward head : 21 cm from earlobe to wall )  Goal Status: PARTIALLY  ( 3 fingers B at flank space, 10/09/22) ( worked on decreasing forward head posture today)    Jerl Mina, PT 11/04/2022, 10:40 AM

## 2022-11-04 ENCOUNTER — Ambulatory Visit: Payer: Medicare HMO

## 2022-11-04 ENCOUNTER — Ambulatory Visit: Payer: Medicare HMO | Admitting: Physical Therapy

## 2022-11-04 DIAGNOSIS — R278 Other lack of coordination: Secondary | ICD-10-CM | POA: Diagnosis not present

## 2022-11-04 DIAGNOSIS — I442 Atrioventricular block, complete: Secondary | ICD-10-CM

## 2022-11-04 DIAGNOSIS — M4125 Other idiopathic scoliosis, thoracolumbar region: Secondary | ICD-10-CM

## 2022-11-04 DIAGNOSIS — R2689 Other abnormalities of gait and mobility: Secondary | ICD-10-CM

## 2022-11-04 LAB — CUP PACEART REMOTE DEVICE CHECK
Battery Remaining Longevity: 119 mo
Battery Voltage: 3.01 V
Brady Statistic AP VP Percent: 0.38 %
Brady Statistic AP VS Percent: 8.22 %
Brady Statistic AS VP Percent: 0.03 %
Brady Statistic AS VS Percent: 91.37 %
Brady Statistic RA Percent Paced: 9.49 %
Brady Statistic RV Percent Paced: 0.41 %
Date Time Interrogation Session: 20240213004253
Implantable Lead Connection Status: 753985
Implantable Lead Connection Status: 753985
Implantable Lead Connection Status: 753985
Implantable Lead Implant Date: 20190805
Implantable Lead Implant Date: 20190805
Implantable Lead Implant Date: 20190805
Implantable Lead Location: 753858
Implantable Lead Location: 753859
Implantable Lead Location: 753860
Implantable Lead Model: 4398
Implantable Lead Model: 5076
Implantable Lead Model: 5076
Implantable Lead Serial Number: 11111
Implantable Pulse Generator Implant Date: 20190805
Lead Channel Impedance Value: 1007 Ohm
Lead Channel Impedance Value: 1045 Ohm
Lead Channel Impedance Value: 304 Ohm
Lead Channel Impedance Value: 399 Ohm
Lead Channel Impedance Value: 399 Ohm
Lead Channel Impedance Value: 418 Ohm
Lead Channel Impedance Value: 494 Ohm
Lead Channel Impedance Value: 532 Ohm
Lead Channel Impedance Value: 608 Ohm
Lead Channel Impedance Value: 646 Ohm
Lead Channel Impedance Value: 817 Ohm
Lead Channel Impedance Value: 855 Ohm
Lead Channel Impedance Value: 874 Ohm
Lead Channel Impedance Value: 912 Ohm
Lead Channel Pacing Threshold Amplitude: 0.625 V
Lead Channel Pacing Threshold Amplitude: 0.75 V
Lead Channel Pacing Threshold Amplitude: 1.125 V
Lead Channel Pacing Threshold Pulse Width: 0.4 ms
Lead Channel Pacing Threshold Pulse Width: 0.4 ms
Lead Channel Pacing Threshold Pulse Width: 0.4 ms
Lead Channel Sensing Intrinsic Amplitude: 1.125 mV
Lead Channel Sensing Intrinsic Amplitude: 1.125 mV
Lead Channel Sensing Intrinsic Amplitude: 10.75 mV
Lead Channel Sensing Intrinsic Amplitude: 10.75 mV
Lead Channel Setting Pacing Amplitude: 1.5 V
Lead Channel Setting Pacing Amplitude: 2 V
Lead Channel Setting Pacing Pulse Width: 0.4 ms
Lead Channel Setting Sensing Sensitivity: 2 mV
Zone Setting Status: 755011
Zone Setting Status: 755011

## 2022-11-04 NOTE — Patient Instructions (Addendum)
Try [name] for vaginal tissue , not [name]     Moisturizers              They are used in the vagina to hydrate the mucous membrane that make up the vaginal canal.              Designed to keep a more normal acid balance (ph)              Once placed in the vagina, it will last between two to three days.              Use 2-3 times per week at bedtime and last longer than 60 min.              Ingredients to avoid is glycerin and fragrance, can increase chance of infection              Should not be used just before sex due to causing irritation              Most are gels administered either in a tampon-shaped applicator or as a vaginal suppository.                They  are non-hormonal.     Types of Moisturizers              Doris [name]- drug store              Doris [name]- Whole foods, Amazon              Doris [name]              Coconut oil- can break down condoms              Doris [name]- (Do no use if on Tamoxifen) amazon              Doris [name]- amazon              Doris [name] , Doris [name] for menopausal or over 65 (if have severe vaginal atrophy or cancer                treatments use Doris [name] for  1 month than move to [employer])- [employer], [email or web address]              Doris [name]- [email or web address]              Doris [name]- Amazon      Lubrication              Used for intercourse to reduce friction              Avoid ones that have glycerin, warming gels, tingling gels, icing or cooling gel, scented              Avoid parabens due to a preservative similar to female sex hormone              May need to be reapplied once or several times during sexual activity              Can be applied to both partners genitals prior to vaginal penetration to minimize friction or irritation              Prevent irritation and mucosal tears that cause post coital pain and increased the risk of vaginal and                urinary tract  infections              Oil-based lubricants cannot be used with condoms due to breaking them down.  Least likely to irritate                vaginal tissue.              Plant based-lubes are safe              Silicone-based lubrication are thicker and last long and used for post-menopausal women   Vaginal Lubricators Here is a list of some suggested lubricators you can use for intercourse. Use the most hypoallergenic product.  You can place on you or your partner. Doris [name]     Doris [name] or Doris [name] ( good  if frequent UTI's)                Doris [name] ([name])                Doris [name]                Coconut oil                Doris [name]- water based [name], amazon                Doris [name]- Amazon                Doris [name]                Doris [name], Target, [employer]                Doris [name]-  [email or web address]  Things to avoid in lubricants are glycerin, warming gels, tingling gels, icing or cooling gels, and scented gels.  Also avoid Vaseline. KY jelly, Replens, and Astroglide kills good bacteria(lactobacilli)   Things to avoid in the vaginal area              Do not use things to irritate the vulvar area              No lotions- see below              No soaps; can use Aveeno or Calendula cleanser if needed. Must be gentle              No deodorants              No douches              Good to sleep without underwear to let the vaginal area to air out              No scrubbing: spread the lips to let warm water rinse over labias and pat dry   ___ See video   Butterfly stretch with pilows under knees   With angel wings, dragging arms on bed   ___  ensuring her living spaces are cleared of clutter and pathways are open and well-lit  to minimize her bumping her dresser and minimize falls.

## 2022-11-04 NOTE — Therapy (Unsigned)
OUTPATIENT PHYSICAL THERAPY Treatment  Patient Name: Doris [name] MRN: [id number] DOB:[birth year], 72 y.o., female Today's Date:   PT End of Session - 11/04/22 1046     Visit Number 12    Number of Visits 18    Date for PT Re-Evaluation 12/18/22    PT Start Time 1020    PT Stop Time 1105    PT Time Calculation (min) 45 min    Activity Tolerance Patient tolerated treatment well    Behavior During Therapy WFL for tasks assessed/performed               Past Medical History:  Diagnosis Date   A-fib ([place])    Cancer ([place])    Basal cell ( bridge of her nose)    CHF (congestive heart failure) ([facility])    Headache    Migraines   Hx of migraine headaches    Hypertension    Medical history non-contributory    Mitral valve prolapse at 19 yrs of age   Past Surgical History:  Procedure Laterality Date   CARDIAC CATHETERIZATION     CATARACT EXTRACTION  02/2017   COLONOSCOPY WITH PROPOFOL N/A 03/03/2017   Procedure: COLONOSCOPY WITH PROPOFOL;  Surgeon: Garlan Fair, MD;  Location: [facility];  Service: Endoscopy;  Laterality: N/A;   COLONOSCOPY WITH PROPOFOL N/A 02/18/2022   Procedure: COLONOSCOPY WITH PROPOFOL;  Surgeon: Lesly Rubenstein, MD;  Location: [facility];  Service: Endoscopy;  Laterality: N/A;   OOPHORECTOMY  2005   Patient Active Problem List   Diagnosis Date Noted   Painful veins 08/07/2018    PCP: Lovie Macadamia MD    REFERRING PROVIDER:Schermerhorn MD  REFERRING DIAG: Pelvic Pain  Rationale for Evaluation and Treatment Rehabilitation  THERAPY DIAG:  Other idiopathic scoliosis, thoracolumbar region   Other abnormalities of gait and mobility   Other lack of coordination  ONSET DATE: 6+ months ago  SUBJECTIVE:                       SUBJECTIVE TODAY  Pt reports after last session , immediately, pt felt radiating pain down both inner thighs and the same area by the R glut muscle. It occurs more with sitting. But does not wake her up at  night nor with bowel / urination.  Pt has one episode of urine leakage of a small amount when she was not aware.    Pt reports she bumped into her dresser again this week.     SUBJECTIVE STATEMENT EVAl on 07/31/22             Sister present and answered some questions for pt.   1) R posterior glut pain started 6 + months ago. Driving stickshift , sitting on non-cushion chairs caused pain. Pain radiates to back of thigh started more recently. Pt walks 4000 steps  in her yard but feels the pain coming on at 2000 steps . Pt used to do 8000 steps prior to this pain.  o 3/10. Currently sitting on plinth 5/10.   Pain 6/10 2) sometimes pain at R by ASIS : 4-5/10 , pt thinks it is related to bowel movements. Pt has Type 1 , 3 stool consistency.  Drinks 3-4 glasses a day. 2 glasses juice, herbal tea, coffee.    PERTINENT HISTORY:  Scoliosis, CA skin, A-fib/ CHF, tachycardia , pacemaker ( total  7 different heart conditions) , recent dementia Dx, twin has Alzheimers   PAIN:  Are you having pain? See  above  PRECAUTIONS: No  WEIGHT BEARING RESTRICTIONS: No  FALLS:  Has patient fallen in last 6 months? Yes, fell on the grass   LIVING ENVIRONMENT: Lives with: lives alone  Lives in: House/apartment Stairs: Yes STE 3 with rail only in the back  Has following equipment at home: No   OCCUPATION: retired Pharmacist, hospital , hobbies: gardening.   PLOF: Independent  PATIENT GOALS:   Not have pain, walk without pain    OBJECTIVE:         HOME EXERCISE PROGRAM: See pt instruction section    ASSESSMENT:  CLINICAL IMPRESSION:                                Recent dementia Dx is a factor in her prognosis. So far, pt is maintaining compliance.  Continue to film HEP on pt's phone which she rewatches. Sister in law was present today.    Discussed with pt's friend who was present today about ensuring her living spaces are cleared of clutter and pathways are open and well-lit  to minimize her  bumping her dresser and minimize falls.    Pt and sister-in-law voiced agreement.  Pt benefits from skilled PT.    OBJECTIVE IMPAIRMENTS decreased activity tolerance, decreased coordination, decreased endurance, decreased mobility, difficulty walking, decreased ROM, decreased strength, decreased safety awareness, hypomobility, increased muscle spasms, impaired flexibility, improper body mechanics, postural dysfunction, and pain.    ACTIVITY LIMITATIONS  self-care,  sleep, home chores, work tasks    PARTICIPATION LIMITATIONS:  community, gardening    PERSONAL FACTORS   scoliosis, recent dementia Dx, lives alone are factors also affecting patient's functional outcome.    REHAB POTENTIAL: Good   CLINICAL DECISION MAKING: Evolving/moderate complexity   EVALUATION COMPLEXITY: Moderate    PATIENT EDUCATION:    Education details: Showed pt anatomy images. Explained muscles attachments/ connection, physiology of deep core system/ spinal- thoracic-pelvis-lower kinetic chain as they relate to pt's presentation, Sx, and past Hx. Explained what and how these areas of deficits need to be restored to balance and function    See Therapeutic activity / neuromuscular re-education section  Answered pt's questions.   Person educated: Patient Education method: Explanation, Demonstration, Tactile cues, Verbal cues, and Handouts Education comprehension: verbalized understanding, returned demonstration, verbal cues required, tactile cues required, and needs further education     PLAN: PT FREQUENCY: 1x/week   PT DURATION: 10 weeks   PLANNED INTERVENTIONS: Therapeutic exercises, Therapeutic activity, Neuromuscular re-education, Balance training, Gait training, Patient/Family education, Self Care, Joint mobilization, Spinal mobilization, Moist heat, Taping, and Manual therapy, dry needling.   PLAN FOR NEXT SESSION: See clinical impression for plan     GOALS: Goals reviewed with patient?  Yes  SHORT TERM GOALS: Target date: 08/29/2022    Pt will demo IND with HEP                    Baseline: Not IND            Goal status: MET    LONG TERM GOALS: Target date:  12/18/2022    1.Pt will demo proper deep core coordination without chest breathing and optimal excursion of diaphragm/pelvic floor in order to promote spinal stability and pelvic floor function  Baseline: dyscoordination Goal status:  On going   2.  Pt will demo > 5 pt change on FOTO  to improve QOL and function   Pelvic Pain baseline -33  Bowel  constipation baseline -68 Bowel Leakage baseline -69    Goal status:Ongoing   3.  Pt will demo proper body mechanics in against gravity tasks and ADLs  work tasks, fitness  to minimize straining pelvic floor / back                  Baseline: not IND, improper form that places strain on pelvic floor                Goal status:  On going     4. Pt will walk > 3000 steps before the onset of pain in order to maintain mobility   Baseline: Pt walks 4000 steps  in her yard but feels the pain coming on at 2000 steps  Goal status: On going     5. Pt will be able drive and sit on non-soft cushions for > 30 min with 50% less glut pain in order to participate in community activities  Baseline:  driving and sitting on non-soft cushions caused R glut pain  Goal status: On going   6. Pt will demo increased gait speed > 1.3 m/s in order to ambulate safely in community with decreased risk for falls   Baseline: 1.15 m/s, limited trunk rotation, decreased stance on L, thorax shifted to L) ,  Goal status: MET                         7. Pt will demo levelled pelvic girdle or flank space B with equal fingers width to indicate thoracic shift to medial line and pt and sister will be IND with scoliosis -specific HEP in order to restore mobility at spine, pelvis, gait, posture  Baseline: scoliosis curve: flank space: L 2 fingers, R 4 fingers width between low rib/ iliac  crest.) ( thoracic kyphosis/ forward head : 21 cm from earlobe to wall )  Goal Status: PARTIALLY  ( 3 fingers B at flank space, 10/09/22) ( worked on decreasing forward head posture today)    Jerl Mina, PT 11/04/2022, 10:46 AM

## 2022-11-06 ENCOUNTER — Encounter: Payer: Medicare HMO | Admitting: Physical Therapy

## 2022-11-06 DIAGNOSIS — H43813 Vitreous degeneration, bilateral: Secondary | ICD-10-CM | POA: Diagnosis not present

## 2022-11-06 DIAGNOSIS — H3530 Unspecified macular degeneration: Secondary | ICD-10-CM | POA: Diagnosis not present

## 2022-11-11 DIAGNOSIS — M79672 Pain in left foot: Secondary | ICD-10-CM | POA: Diagnosis not present

## 2022-11-11 DIAGNOSIS — M79671 Pain in right foot: Secondary | ICD-10-CM | POA: Diagnosis not present

## 2022-11-13 ENCOUNTER — Ambulatory Visit: Payer: Medicare HMO | Admitting: Physical Therapy

## 2022-11-13 DIAGNOSIS — M4125 Other idiopathic scoliosis, thoracolumbar region: Secondary | ICD-10-CM

## 2022-11-13 DIAGNOSIS — R278 Other lack of coordination: Secondary | ICD-10-CM

## 2022-11-13 DIAGNOSIS — R2689 Other abnormalities of gait and mobility: Secondary | ICD-10-CM

## 2022-11-13 NOTE — Therapy (Signed)
OUTPATIENT PHYSICAL THERAPY Treatment  Patient Name: Doris [name] MRN: [id number] DOB:1950-11-30, 72 y.o., female Today's Date:   PT End of Session - 11/13/22 0936     Visit Number 13    Number of Visits 18    Date for PT Re-Evaluation 12/18/22    PT Start Time 0933    PT Stop Time [id number]    PT Time Calculation (min) 42 min    Activity Tolerance Patient tolerated treatment well    Behavior During Therapy [facility] for tasks assessed/performed               Past Medical History:  Diagnosis Date   A-fib ([place])    Cancer ([place])    Basal cell ( bridge of her nose)    CHF (congestive heart failure) (HCC)    Headache    Migraines   Hx of migraine headaches    Hypertension    Medical history non-contributory    Mitral valve prolapse at 19 yrs of age   Past Surgical History:  Procedure Laterality Date   CARDIAC CATHETERIZATION     CATARACT EXTRACTION  02/2017   COLONOSCOPY WITH PROPOFOL N/A 03/03/2017   Procedure: COLONOSCOPY WITH PROPOFOL;  Surgeon: Garlan Fair, MD;  Location: [facility];  Service: Endoscopy;  Laterality: N/A;   COLONOSCOPY WITH PROPOFOL N/A 02/18/2022   Procedure: COLONOSCOPY WITH PROPOFOL;  Surgeon: Lesly Rubenstein, MD;  Location: [facility] ENDOSCOPY;  Service: Endoscopy;  Laterality: N/A;   OOPHORECTOMY  2005   Patient Active Problem List   Diagnosis Date Noted   Painful veins 08/07/2018    PCP: Lovie Macadamia MD    REFERRING PROVIDER:Schermerhorn MD  REFERRING DIAG: Pelvic Pain  Rationale for Evaluation and Treatment Rehabilitation  THERAPY DIAG:  Other idiopathic scoliosis, thoracolumbar region   Other abnormalities of gait and mobility   Other lack of coordination  ONSET DATE: 6+ months ago  SUBJECTIVE:                       SUBJECTIVE TODAY  Pt reports after last session , the pain still hurts in the R glut but it is no longer traveling down to ankle, it stops at the back of the back of the knee.   It no longer is a 6/10 but at  4/10.  Pt's sister said they went to her PCP who looked at her B foot and confirmed she didn't have an ingrown toe nail and swelling has gone down. He recommended larger shoes.   Sister and pt's friend plans to assess her furniture to minimize bumping in dressers and minimize falls risk.    SUBJECTIVE STATEMENT EVAl on 07/31/22             Sister present and answered some questions for pt.   1) R posterior glut pain started 6 + months ago. Driving stickshift , sitting on non-cushion chairs caused pain. Pain radiates to back of thigh started more recently. Pt walks 4000 steps  in her yard but feels the pain coming on at 2000 steps . Pt used to do 8000 steps prior to this pain.  o 3/10. Currently sitting on plinth 5/10.   Pain 6/10 2) sometimes pain at R by ASIS : 4-5/10 , pt thinks it is related to bowel movements. Pt has Type 1 , 3 stool consistency.  Drinks 3-4 glasses a day. 2 glasses juice, herbal tea, coffee.    PERTINENT HISTORY:  Scoliosis, CA skin, A-fib/ CHF, tachycardia ,  pacemaker ( total  7 different heart conditions) , recent dementia Dx, twin has Alzheimers   PAIN:  Are you having pain? See above  PRECAUTIONS: No  WEIGHT BEARING RESTRICTIONS: No  FALLS:  Has patient fallen in last 6 months? Yes, fell on the grass   LIVING ENVIRONMENT: Lives with: lives alone  Lives in: House/apartment Stairs: Yes STE 3 with rail only in the back  Has following equipment at home: No   OCCUPATION: retired Pharmacist, hospital , hobbies: gardening.   PLOF: Independent  PATIENT GOALS:   Not have pain, walk without pain    OBJECTIVE:     [facility] PT Assessment - 11/13/22 1246       Palpation   Spinal mobility tightness along intercostals R, interspinals/  paraspinals/ medial scapula R ,             [facility] Adult PT Treatment/Exercise - 11/13/22 1246       Posture/Postural Control   Posture Comments posture: L trunk lean , less thoracic kyphosis, rotational scoliosis still present       Neuro Re-ed    Neuro Re-ed Details  cued for seated R trunk rotation to address rotational component of scoliosis      Modalities   Modalities Moist Heat      Moist Heat Therapy   Moist Heat Location --   posterior trunk rotation in supported pillows     Manual Therapy   Manual therapy comments STM/MWM at R intercostals / iliocostals tightness ,  to realign rotation of scoliosis on L thorax               HOME EXERCISE PROGRAM: See pt instruction section    ASSESSMENT:  CLINICAL IMPRESSION:                  Pt reports after last session , the pain still hurts in the R glut but it is no longer traveling down to ankle, it stops at the back of the back of the knee.   It no longer is a 6/10 but at 4/10. This indicates last session's Tx was successful and discontinuation of standing hip extensions is still in place. Pt's rotational component of scoliosis must improve first but adding hip extension back into HEP.   Seated R trunk rotation HEP was better for pt to follow compared to standing. Manual Tx was applied to R thorax after which pt reported decreased burning sensation at R glut.   Reviewed butterfly pose with less hip flex/ abduction with pillows supported under knees and guided angel wings for lengthening L thoracic area.              Pt reported no increased pain after today's Tx.  Pt was pleased to notice that her umbilicus was more centered which had not been center for a long time.   Recent dementia Dx is a factor in her prognosis.    Discussed with pt's sister who was present today about ensuring her living spaces are cleared of clutter and pathways are open and well-lit  to minimize her bumping her dresser and minimize falls.   Plan to consolidate past HEP to simplify and focus on scoliosis as pain relief at pelvic area. Pt benefits from skilled PT.    OBJECTIVE IMPAIRMENTS decreased activity tolerance, decreased coordination, decreased endurance, decreased  mobility, difficulty walking, decreased ROM, decreased strength, decreased safety awareness, hypomobility, increased muscle spasms, impaired flexibility, improper body mechanics, postural dysfunction, and pain.    ACTIVITY  LIMITATIONS  self-care,  sleep, home chores, work tasks    PARTICIPATION LIMITATIONS:  community, gardening    PERSONAL FACTORS   scoliosis, recent dementia Dx, lives alone are factors also affecting patient's functional outcome.    REHAB POTENTIAL: Good   CLINICAL DECISION MAKING: Evolving/moderate complexity   EVALUATION COMPLEXITY: Moderate    PATIENT EDUCATION:    Education details: Showed pt anatomy images. Explained muscles attachments/ connection, physiology of deep core system/ spinal- thoracic-pelvis-lower kinetic chain as they relate to pt's presentation, Sx, and past Hx. Explained what and how these areas of deficits need to be restored to balance and function    See Therapeutic activity / neuromuscular re-education section  Answered pt's questions.   Person educated: Patient Education method: Explanation, Demonstration, Tactile cues, Verbal cues, and Handouts Education comprehension: verbalized understanding, returned demonstration, verbal cues required, tactile cues required, and needs further education     PLAN: PT FREQUENCY: 1x/week   PT DURATION: 10 weeks   PLANNED INTERVENTIONS: Therapeutic exercises, Therapeutic activity, Neuromuscular re-education, Balance training, Gait training, Patient/Family education, Self Care, Joint mobilization, Spinal mobilization, Moist heat, Taping, and Manual therapy, dry needling.   PLAN FOR NEXT SESSION: See clinical impression for plan     GOALS: Goals reviewed with patient? Yes  SHORT TERM GOALS: Target date: 08/29/2022    Pt will demo IND with HEP                    Baseline: Not IND            Goal status: MET    LONG TERM GOALS: Target date:  12/18/2022    1.Pt will demo proper deep core  coordination without chest breathing and optimal excursion of diaphragm/pelvic floor in order to promote spinal stability and pelvic floor function  Baseline: dyscoordination Goal status:  On going   2.  Pt will demo > 5 pt change on FOTO  to improve QOL and function   Pelvic Pain baseline -33  Bowel  constipation baseline -68 Bowel Leakage baseline -69    Goal status:Ongoing   3.  Pt will demo proper body mechanics in against gravity tasks and ADLs  work tasks, fitness  to minimize straining pelvic floor / back                  Baseline: not IND, improper form that places strain on pelvic floor                Goal status:  On going     4. Pt will walk > 3000 steps before the onset of pain in order to maintain mobility   Baseline: Pt walks 4000 steps  in her yard but feels the pain coming on at 2000 steps  Goal status: On going     5. Pt will be able drive and sit on non-soft cushions for > 30 min with 50% less glut pain in order to participate in community activities  Baseline:  driving and sitting on non-soft cushions caused R glut pain  Goal status: On going   6. Pt will demo increased gait speed > 1.3 m/s in order to ambulate safely in community with decreased risk for falls   Baseline: 1.15 m/s, limited trunk rotation, decreased stance on L, thorax shifted to L) ,  Goal status: MET                         7. Pt  will demo levelled pelvic girdle or flank space B with equal fingers width to indicate thoracic shift to medial line and pt and sister will be IND with scoliosis -specific HEP in order to restore mobility at spine, pelvis, gait, posture  Baseline: scoliosis curve: flank space: L 2 fingers, R 4 fingers width between low rib/ iliac crest.) ( thoracic kyphosis/ forward head : 21 cm from earlobe to wall )  Goal Status: PARTIALLY  ( 3 fingers B at flank space, 10/09/22) ( worked on decreasing forward head posture today)    Jerl Mina, PT 11/13/2022, 9:37 AM

## 2022-11-13 NOTE — Patient Instructions (Signed)
Continue with butterfly ( knees less bent)   Angel wings   NEW:  Seated.  Inhale tall, exhale turn navel to the R, heart to the R, and then look ofer R shoulder 10 reps  __  Countertop:   L elbow lean to L

## 2022-11-20 ENCOUNTER — Ambulatory Visit: Payer: Medicare HMO | Admitting: Physical Therapy

## 2022-11-20 DIAGNOSIS — M4125 Other idiopathic scoliosis, thoracolumbar region: Secondary | ICD-10-CM

## 2022-11-20 DIAGNOSIS — R278 Other lack of coordination: Secondary | ICD-10-CM | POA: Diagnosis not present

## 2022-11-20 DIAGNOSIS — R2689 Other abnormalities of gait and mobility: Secondary | ICD-10-CM | POA: Diagnosis not present

## 2022-11-20 NOTE — Patient Instructions (Signed)
  Kitchen counter twist   Lunge position R foot forward , knee over ankle, inhale, stand tall, twist to R at ribs

## 2022-11-20 NOTE — Therapy (Signed)
OUTPATIENT PHYSICAL THERAPY Treatment  Patient Name: Doris [name] MRN: [id number] DOB:09-21-1951, 72 y.o., female Today's Date:   PT End of Session - 11/20/22 0944     Visit Number 14    Number of Visits 18    Date for PT Re-Evaluation 12/18/22    PT Start Time 0937    PT Stop Time 1015    PT Time Calculation (min) 38 min    Activity Tolerance Patient tolerated treatment well    Behavior During Therapy [facility] for tasks assessed/performed               Past Medical History:  Diagnosis Date   A-fib ([place])    Cancer ([place])    Basal cell ( bridge of her nose)    CHF (congestive heart failure) (HCC)    Headache    Migraines   Hx of migraine headaches    Hypertension    Medical history non-contributory    Mitral valve prolapse at 19 yrs of age   Past Surgical History:  Procedure Laterality Date   CARDIAC CATHETERIZATION     CATARACT EXTRACTION  02/2017   COLONOSCOPY WITH PROPOFOL N/A 03/03/2017   Procedure: COLONOSCOPY WITH PROPOFOL;  Surgeon: Garlan Fair, MD;  Location: [facility];  Service: Endoscopy;  Laterality: N/A;   COLONOSCOPY WITH PROPOFOL N/A 02/18/2022   Procedure: COLONOSCOPY WITH PROPOFOL;  Surgeon: Lesly Rubenstein, MD;  Location: [facility];  Service: Endoscopy;  Laterality: N/A;   OOPHORECTOMY  2005   Patient Active Problem List   Diagnosis Date Noted   Painful veins 08/07/2018    PCP: Lovie Macadamia MD    REFERRING PROVIDER:Schermerhorn MD  REFERRING DIAG: Pelvic Pain  Rationale for Evaluation and Treatment Rehabilitation  THERAPY DIAG:  Other idiopathic scoliosis, thoracolumbar region   Other abnormalities of gait and mobility   Other lack of coordination  ONSET DATE: 6+ months ago  SUBJECTIVE:                       SUBJECTIVE TODAY  Pt reports the pain still hurts in the R glut but it is no longer traveling down to ankle, it stops at the back of the back of the knee.  Pt had one night when it hurt bad to the back of the  knee out of 1 week at 4/10 pain. One night it was not hurting at the back of the knee at all.  Overall, it does not happen as often. The burning is still at the labia. Pt has hemorrhoids. Pt felt pain with the seated twist HEP  SUBJECTIVE STATEMENT EVAl on 07/31/22             Sister present and answered some questions for pt.   1) R posterior glut pain started 6 + months ago. Driving stickshift , sitting on non-cushion chairs caused pain. Pain radiates to back of thigh started more recently. Pt walks 4000 steps  in her yard but feels the pain coming on at 2000 steps . Pt used to do 8000 steps prior to this pain.  o 3/10. Currently sitting on plinth 5/10.   Pain 6/10 2) sometimes pain at R by ASIS : 4-5/10 , pt thinks it is related to bowel movements. Pt has Type 1 , 3 stool consistency.  Drinks 3-4 glasses a day. 2 glasses juice, herbal tea, coffee.    PERTINENT HISTORY:  Scoliosis, CA skin, A-fib/ CHF, tachycardia , pacemaker ( total  7 different heart  conditions) , recent dementia Dx, twin has Alzheimers   PAIN:  Are you having pain? See above  PRECAUTIONS: No  WEIGHT BEARING RESTRICTIONS: No  FALLS:  Has patient fallen in last 6 months? Yes, fell on the grass   LIVING ENVIRONMENT: Lives with: lives alone  Lives in: House/apartment Stairs: Yes STE 3 with rail only in the back  Has following equipment at home: No   OCCUPATION: retired Pharmacist, hospital , hobbies: gardening.   PLOF: Independent  PATIENT GOALS:   Not have pain, walk without pain    OBJECTIVE:           HOME EXERCISE PROGRAM: See pt instruction section    ASSESSMENT:  CLINICAL IMPRESSION:                Pt reports the pain still hurts in the R glut but it is no longer traveling down to ankle, it stops at the back of the back of the knee.  Pt had one night when it hurt bad to the back of the knee out of 1 week at 4/10 pain. One night it was not hurting at the back of the knee at all.  Overall, it does not  happen as often.  Focused on minimizing L posterior trunk rotation of scoliosis with manual Tx which pt tolerated without complaints as this realignment is helping to centralize her radiating pain at R glut due to the cross-diagonal relationship of posterior chain system.   Seated R trunk rotation HEP is witheld from HEP due to pain. Standing version with BUE support  was added which did not cause pain.         Pt reported no increased pain after today's Tx.    Burning vaginal pain remains an area of focus along with the centralizing pain at R glut area.   Recent dementia Dx is a factor in her prognosis.   Plan to consolidate past HEP to simplify and focus on scoliosis as pain relief at pelvic area. Pt benefits from skilled PT.    OBJECTIVE IMPAIRMENTS decreased activity tolerance, decreased coordination, decreased endurance, decreased mobility, difficulty walking, decreased ROM, decreased strength, decreased safety awareness, hypomobility, increased muscle spasms, impaired flexibility, improper body mechanics, postural dysfunction, and pain.    ACTIVITY LIMITATIONS  self-care,  sleep, home chores, work tasks    PARTICIPATION LIMITATIONS:  community, gardening    PERSONAL FACTORS   scoliosis, recent dementia Dx, lives alone are factors also affecting patient's functional outcome.    REHAB POTENTIAL: Good   CLINICAL DECISION MAKING: Evolving/moderate complexity   EVALUATION COMPLEXITY: Moderate    PATIENT EDUCATION:    Education details: Showed pt anatomy images. Explained muscles attachments/ connection, physiology of deep core system/ spinal- thoracic-pelvis-lower kinetic chain as they relate to pt's presentation, Sx, and past Hx. Explained what and how these areas of deficits need to be restored to balance and function    See Therapeutic activity / neuromuscular re-education section  Answered pt's questions.   Person educated: Patient Education method: Explanation,  Demonstration, Tactile cues, Verbal cues, and Handouts Education comprehension: verbalized understanding, returned demonstration, verbal cues required, tactile cues required, and needs further education     PLAN: PT FREQUENCY: 1x/week   PT DURATION: 10 weeks   PLANNED INTERVENTIONS: Therapeutic exercises, Therapeutic activity, Neuromuscular re-education, Balance training, Gait training, Patient/Family education, Self Care, Joint mobilization, Spinal mobilization, Moist heat, Taping, and Manual therapy, dry needling.   PLAN FOR NEXT SESSION: See clinical impression for plan  GOALS: Goals reviewed with patient? Yes  SHORT TERM GOALS: Target date: 08/29/2022    Pt will demo IND with HEP                    Baseline: Not IND            Goal status: MET    LONG TERM GOALS: Target date:  12/18/2022    1.Pt will demo proper deep core coordination without chest breathing and optimal excursion of diaphragm/pelvic floor in order to promote spinal stability and pelvic floor function  Baseline: dyscoordination Goal status:  On going   2.  Pt will demo > 5 pt change on FOTO  to improve QOL and function   Pelvic Pain baseline -33  Bowel  constipation baseline -68 Bowel Leakage baseline -69    Goal status:Ongoing   3.  Pt will demo proper body mechanics in against gravity tasks and ADLs  work tasks, fitness  to minimize straining pelvic floor / back                  Baseline: not IND, improper form that places strain on pelvic floor                Goal status:  On going     4. Pt will walk > 3000 steps before the onset of pain in order to maintain mobility   Baseline: Pt walks 4000 steps  in her yard but feels the pain coming on at 2000 steps  Goal status: On going     5. Pt will be able drive and sit on non-soft cushions for > 30 min with 50% less glut pain in order to participate in community activities  Baseline:  driving and sitting on non-soft cushions caused R glut  pain  Goal status: On going   6. Pt will demo increased gait speed > 1.3 m/s in order to ambulate safely in community with decreased risk for falls   Baseline: 1.15 m/s, limited trunk rotation, decreased stance on L, thorax shifted to L) ,  Goal status: MET                         7. Pt will demo levelled pelvic girdle or flank space B with equal fingers width to indicate thoracic shift to medial line and pt and sister will be IND with scoliosis -specific HEP in order to restore mobility at spine, pelvis, gait, posture  Baseline: scoliosis curve: flank space: L 2 fingers, R 4 fingers width between low rib/ iliac crest.) ( thoracic kyphosis/ forward head : 21 cm from earlobe to wall )  Goal Status: PARTIALLY  ( 3 fingers B at flank space, 10/09/22) ( worked on decreasing forward head posture today)    Jerl Mina, PT 11/20/2022, 9:44 AM

## 2022-11-27 ENCOUNTER — Ambulatory Visit: Payer: Medicare HMO | Attending: Obstetrics and Gynecology | Admitting: Physical Therapy

## 2022-11-27 DIAGNOSIS — M4125 Other idiopathic scoliosis, thoracolumbar region: Secondary | ICD-10-CM | POA: Insufficient documentation

## 2022-11-27 DIAGNOSIS — R278 Other lack of coordination: Secondary | ICD-10-CM | POA: Diagnosis not present

## 2022-11-27 DIAGNOSIS — R2689 Other abnormalities of gait and mobility: Secondary | ICD-10-CM | POA: Insufficient documentation

## 2022-11-27 NOTE — Patient Instructions (Signed)
Apply the moisturizer externally at labia  __   Review past exercises

## 2022-11-27 NOTE — Therapy (Signed)
OUTPATIENT PHYSICAL THERAPY Treatment  Patient Name: Doris [name] MRN: [id number] DOB:02/16/1951, 72 y.o., female Today's Date:   PT End of Session - 11/27/22 1122     Visit Number 15    Number of Visits 18    Date for PT Re-Evaluation 12/18/22    PT Start Time 1120    PT Stop Time 1158    PT Time Calculation (min) 38 min    Activity Tolerance Patient tolerated treatment well    Behavior During Therapy WFL for tasks assessed/performed               Past Medical History:  Diagnosis Date   A-fib ([place])    Cancer ([place])    Basal cell ( bridge of her nose)    CHF (congestive heart failure) (HCC)    Headache    Migraines   Hx of migraine headaches    Hypertension    Medical history non-contributory    Mitral valve prolapse at 19 yrs of age   Past Surgical History:  Procedure Laterality Date   CARDIAC CATHETERIZATION     CATARACT EXTRACTION  02/2017   COLONOSCOPY WITH PROPOFOL N/A 03/03/2017   Procedure: COLONOSCOPY WITH PROPOFOL;  Surgeon: Garlan Fair, MD;  Location: [facility];  Service: Endoscopy;  Laterality: N/A;   COLONOSCOPY WITH PROPOFOL N/A 02/18/2022   Procedure: COLONOSCOPY WITH PROPOFOL;  Surgeon: Lesly Rubenstein, MD;  Location: [facility];  Service: Endoscopy;  Laterality: N/A;   OOPHORECTOMY  2005   Patient Active Problem List   Diagnosis Date Noted   Painful veins 08/07/2018    PCP: Lovie Macadamia MD    REFERRING PROVIDER:Schermerhorn MD  REFERRING DIAG: Pelvic Pain  Rationale for Evaluation and Treatment Rehabilitation  THERAPY DIAG:  Other idiopathic scoliosis, thoracolumbar region   Other abnormalities of gait and mobility   Other lack of coordination  ONSET DATE: 6+ months ago  SUBJECTIVE:                       SUBJECTIVE TODAY  Pt reports no pain to the ankle the past week. It only stops at the knee. It is less with walking now.   SUBJECTIVE STATEMENT EVAl on 07/31/22             Sister present and answered some  questions for pt.   1) R posterior glut pain started 6 + months ago. Driving stickshift , sitting on non-cushion chairs caused pain. Pain radiates to back of thigh started more recently. Pt walks 4000 steps  in her yard but feels the pain coming on at 2000 steps . Pt used to do 8000 steps prior to this pain.  o 3/10. Currently sitting on plinth 5/10.   Pain 6/10 2) sometimes pain at R by ASIS : 4-5/10 , pt thinks it is related to bowel movements. Pt has Type 1 , 3 stool consistency.  Drinks 3-4 glasses a day. 2 glasses juice, herbal tea, coffee.    PERTINENT HISTORY:  Scoliosis, CA skin, A-fib/ CHF, tachycardia , pacemaker ( total  7 different heart conditions) , recent dementia Dx, twin has Alzheimers   PAIN:  Are you having pain? See above  PRECAUTIONS: No  WEIGHT BEARING RESTRICTIONS: No  FALLS:  Has patient fallen in last 6 months? Yes, fell on the grass   LIVING ENVIRONMENT: Lives with: lives alone  Lives in: House/apartment Stairs: Yes STE 3 with rail only in the back  Has following equipment at home:  No   OCCUPATION: retired Pharmacist, hospital , hobbies: gardening.   PLOF: Independent  PATIENT GOALS:   Not have pain, walk without pain    OBJECTIVE:     [facility] PT Assessment - 11/27/22 1150       Posture/Postural Control   Posture Comments slight lean to L      Palpation   Spinal mobility R T1-2 deviated to R, upper trap tightness, intercostals / iliocostals tightness , to realign rotation of scoliosis on R thorax             [facility] Adult PT Treatment/Exercise - 11/27/22 1147       Neuro Re-ed    Neuro Re-ed Details  cued for past HEP , friend took pictures to guide her at home      Manual Therapy   Manual therapy comments STM/MWM at R T1-2 deviated to R, upper trap tightness,   intercostals / iliocostals tightness  to realign rotation of scoliosis on  R thorax                HOME EXERCISE PROGRAM: See pt instruction section    ASSESSMENT:  CLINICAL  IMPRESSION:                Centralization of pain remains this week as pt reports no more radiating pain to ankle while walking. Pain has centralized to behind knee. This imporvement correlates with gradually improved medially alignment of upper spine.   Educated pt and friend to apply vaginal moisturizer to externally  instead of internally to address burning in vaginally.  Manual Tx applied again  to address deviated T2-3 segments,  upper trap tightness,   intercostals / iliocostals tightness  to realign rotation of scoliosis on  R thorax.    Recent dementia Dx is a factor in her prognosis.   Plan to consolidate past HEP to simplify and focus on scoliosis as pain relief at pelvic area. Pt benefits from skilled PT.    OBJECTIVE IMPAIRMENTS decreased activity tolerance, decreased coordination, decreased endurance, decreased mobility, difficulty walking, decreased ROM, decreased strength, decreased safety awareness, hypomobility, increased muscle spasms, impaired flexibility, improper body mechanics, postural dysfunction, and pain.    ACTIVITY LIMITATIONS  self-care,  sleep, home chores, work tasks    PARTICIPATION LIMITATIONS:  community, gardening    PERSONAL FACTORS   scoliosis, recent dementia Dx, lives alone are factors also affecting patient's functional outcome.    REHAB POTENTIAL: Good   CLINICAL DECISION MAKING: Evolving/moderate complexity   EVALUATION COMPLEXITY: Moderate    PATIENT EDUCATION:    Education details: Showed pt anatomy images. Explained muscles attachments/ connection, physiology of deep core system/ spinal- thoracic-pelvis-lower kinetic chain as they relate to pt's presentation, Sx, and past Hx. Explained what and how these areas of deficits need to be restored to balance and function    See Therapeutic activity / neuromuscular re-education section  Answered pt's questions.   Person educated: Patient Education method: Explanation, Demonstration, Tactile  cues, Verbal cues, and Handouts Education comprehension: verbalized understanding, returned demonstration, verbal cues required, tactile cues required, and needs further education     PLAN: PT FREQUENCY: 1x/week   PT DURATION: 10 weeks   PLANNED INTERVENTIONS: Therapeutic exercises, Therapeutic activity, Neuromuscular re-education, Balance training, Gait training, Patient/Family education, Self Care, Joint mobilization, Spinal mobilization, Moist heat, Taping, and Manual therapy, dry needling.   PLAN FOR NEXT SESSION: See clinical impression for plan     GOALS: Goals reviewed with patient? Yes  SHORT TERM GOALS:  Target date: 08/29/2022    Pt will demo IND with HEP                    Baseline: Not IND            Goal status: MET    LONG TERM GOALS: Target date:  12/18/2022    1.Pt will demo proper deep core coordination without chest breathing and optimal excursion of diaphragm/pelvic floor in order to promote spinal stability and pelvic floor function  Baseline: dyscoordination Goal status:  On going   2.  Pt will demo > 5 pt change on FOTO  to improve QOL and function   Pelvic Pain baseline -33  Bowel  constipation baseline -68 Bowel Leakage baseline -69    Goal status:Ongoing   3.  Pt will demo proper body mechanics in against gravity tasks and ADLs  work tasks, fitness  to minimize straining pelvic floor / back                  Baseline: not IND, improper form that places strain on pelvic floor                Goal status:  On going     4. Pt will walk > 3000 steps before the onset of pain in order to maintain mobility   Baseline: Pt walks 4000 steps  in her yard but feels the pain coming on at 2000 steps  Goal status: On going     5. Pt will be able drive and sit on non-soft cushions for > 30 min with 50% less glut pain in order to participate in community activities  Baseline:  driving and sitting on non-soft cushions caused R glut pain  Goal status: On  going   6. Pt will demo increased gait speed > 1.3 m/s in order to ambulate safely in community with decreased risk for falls   Baseline: 1.15 m/s, limited trunk rotation, decreased stance on L, thorax shifted to L) ,  Goal status: MET                         7. Pt will demo levelled pelvic girdle or flank space B with equal fingers width to indicate thoracic shift to medial line and pt and sister will be IND with scoliosis -specific HEP in order to restore mobility at spine, pelvis, gait, posture  Baseline: scoliosis curve: flank space: L 2 fingers, R 4 fingers width between low rib/ iliac crest.) ( thoracic kyphosis/ forward head : 21 cm from earlobe to wall )  Goal Status: PARTIALLY  ( 3 fingers B at flank space, 10/09/22) ( worked on decreasing forward head posture today)    Jerl Mina, PT 11/27/2022, 11:23 AM

## 2022-12-04 ENCOUNTER — Ambulatory Visit: Payer: Medicare HMO | Admitting: Physical Therapy

## 2022-12-08 DIAGNOSIS — R102 Pelvic and perineal pain: Secondary | ICD-10-CM | POA: Diagnosis not present

## 2022-12-08 DIAGNOSIS — Z9889 Other specified postprocedural states: Secondary | ICD-10-CM | POA: Diagnosis not present

## 2022-12-08 DIAGNOSIS — E785 Hyperlipidemia, unspecified: Secondary | ICD-10-CM | POA: Diagnosis not present

## 2022-12-08 DIAGNOSIS — M810 Age-related osteoporosis without current pathological fracture: Secondary | ICD-10-CM | POA: Diagnosis not present

## 2022-12-08 DIAGNOSIS — Z8679 Personal history of other diseases of the circulatory system: Secondary | ICD-10-CM | POA: Diagnosis not present

## 2022-12-08 DIAGNOSIS — I509 Heart failure, unspecified: Secondary | ICD-10-CM | POA: Diagnosis not present

## 2022-12-08 DIAGNOSIS — Z0000 Encounter for general adult medical examination without abnormal findings: Secondary | ICD-10-CM | POA: Diagnosis not present

## 2022-12-08 DIAGNOSIS — Z1331 Encounter for screening for depression: Secondary | ICD-10-CM | POA: Diagnosis not present

## 2022-12-08 NOTE — Progress Notes (Signed)
Remote pacemaker transmission.   

## 2022-12-11 ENCOUNTER — Ambulatory Visit: Payer: Medicare HMO | Admitting: Physical Therapy

## 2022-12-11 DIAGNOSIS — R2689 Other abnormalities of gait and mobility: Secondary | ICD-10-CM

## 2022-12-11 DIAGNOSIS — R278 Other lack of coordination: Secondary | ICD-10-CM | POA: Diagnosis not present

## 2022-12-11 DIAGNOSIS — M4125 Other idiopathic scoliosis, thoracolumbar region: Secondary | ICD-10-CM

## 2022-12-11 NOTE — Therapy (Addendum)
OUTPATIENT PHYSICAL THERAPY Treatment  Patient Name: Doris [name] MRN: [id number] DOB:23-Jul-1951, 72 y.o., female Today's Date:   PT End of Session - 12/11/22 1123     Visit Number 16    Number of Visits 18    Date for PT Re-Evaluation 12/18/22    PT Start Time 1110    PT Stop Time 1148    PT Time Calculation (min) 38 min    Activity Tolerance Patient tolerated treatment well    Behavior During Therapy WFL for tasks assessed/performed               Past Medical History:  Diagnosis Date   A-fib ([place])    Cancer ([place])    Basal cell ( bridge of her nose)    CHF (congestive heart failure) (HCC)    Headache    Migraines   Hx of migraine headaches    Hypertension    Medical history non-contributory    Mitral valve prolapse at 19 yrs of age   Past Surgical History:  Procedure Laterality Date   CARDIAC CATHETERIZATION     CATARACT EXTRACTION  02/2017   COLONOSCOPY WITH PROPOFOL N/A 03/03/2017   Procedure: COLONOSCOPY WITH PROPOFOL;  Surgeon: Garlan Fair, MD;  Location: [facility];  Service: Endoscopy;  Laterality: N/A;   COLONOSCOPY WITH PROPOFOL N/A 02/18/2022   Procedure: COLONOSCOPY WITH PROPOFOL;  Surgeon: Lesly Rubenstein, MD;  Location: [facility];  Service: Endoscopy;  Laterality: N/A;   OOPHORECTOMY  2005   Patient Active Problem List   Diagnosis Date Noted   Painful veins 08/07/2018    PCP: Lovie Macadamia MD    REFERRING PROVIDER:Schermerhorn MD  REFERRING DIAG: Pelvic Pain  Rationale for Evaluation and Treatment Rehabilitation  THERAPY DIAG:  Other idiopathic scoliosis, thoracolumbar region   Other abnormalities of gait and mobility   Other lack of coordination  ONSET DATE: 6+ months ago  SUBJECTIVE:                       SUBJECTIVE TODAY  Pt reports L LBP after picking sticks with her brother. Burning labia pain is still present.   SUBJECTIVE STATEMENT EVAl on 07/31/22             Sister present and answered some questions  for pt.   1) R posterior glut pain started 6 + months ago. Driving stickshift , sitting on non-cushion chairs caused pain. Pain radiates to back of thigh started more recently. Pt walks 4000 steps  in her yard but feels the pain coming on at 2000 steps . Pt used to do 8000 steps prior to this pain.  o 3/10. Currently sitting on plinth 5/10.   Pain 6/10 2) sometimes pain at R by ASIS : 4-5/10 , pt thinks it is related to bowel movements. Pt has Type 1 , 3 stool consistency.  Drinks 3-4 glasses a day. 2 glasses juice, herbal tea, coffee.    PERTINENT HISTORY:  Scoliosis, CA skin, A-fib/ CHF, tachycardia , pacemaker ( total  7 different heart conditions) , recent dementia Dx, twin has Alzheimers   PAIN:  Are you having pain? See above  PRECAUTIONS: No  WEIGHT BEARING RESTRICTIONS: No  FALLS:  Has patient fallen in last 6 months? Yes, fell on the grass   LIVING ENVIRONMENT: Lives with: lives alone  Lives in: House/apartment Stairs: Yes STE 3 with rail only in the back  Has following equipment at home: No   OCCUPATION: retired Pharmacist, hospital ,  hobbies: gardening.   PLOF: Independent  PATIENT GOALS:   Not have pain, walk without pain    OBJECTIVE:    [facility] PT Assessment - 12/12/22 0844       Posture/Postural Control   Posture Comments slight lean to L with rotation posteriorly      Palpation   Spinal mobility hypomobile upper trap tightness, intercostals / iliocostals tightness , to realign rotation of scoliosis on L thorax             [facility] Adult PT Treatment/Exercise - 12/12/22 [id number]       Therapeutic Activites    Other Therapeutic Activities discussed with sister about d/c at next session      Neuro Re-ed    Neuro Re-ed Details  cued for consolidated HEP ,      Manual Therapy   Manual therapy comments STM/MWM at hypomobile upper trap tightness,   intercostals / iliocostals tightness ,  to realign rotation of scoliosis on  L thorax                HOME  EXERCISE PROGRAM: See pt instruction section    ASSESSMENT:  CLINICAL IMPRESSION:                Pt;s dementia is evident as pt still demo'd confusion with directions to clinic treatment after 16 visits. Today, pt went to the bathroom and was about to doff pants over the chair in the bathroom instead of the toilet. Therapist redirected her and gave her privacy to complete urination. Pt got lost when leaving bathroom and was not able to return directly to treatment room. Therapist spoke with sister about upcoming d/c and had emailed dementia resources. Last session, her friend who accompanied pt expressed she noticed pt's dementia has progressed. Sister voiced today that she finally was able to obtain Power of Attorney for pt.    Pt's upright posture has improved but the L thoracic lean is still present. Manual Tx applied again  to address deviated T2-3 segments,  upper trap tightness,   intercostals / iliocostals tightness  to realign rotation of scoliosis on L  thorax.   Recent dementia Dx is a factor in her prognosis.   Plan to review  HEP chart with pt and caregivers. Plan to assess pelvic floor at next session.   Pt benefits from skilled PT.    OBJECTIVE IMPAIRMENTS decreased activity tolerance, decreased coordination, decreased endurance, decreased mobility, difficulty walking, decreased ROM, decreased strength, decreased safety awareness, hypomobility, increased muscle spasms, impaired flexibility, improper body mechanics, postural dysfunction, and pain.    ACTIVITY LIMITATIONS  self-care,  sleep, home chores, work tasks    PARTICIPATION LIMITATIONS:  community, gardening    PERSONAL FACTORS   scoliosis, recent dementia Dx, lives alone are factors also affecting patient's functional outcome.    REHAB POTENTIAL: Good   CLINICAL DECISION MAKING: Evolving/moderate complexity   EVALUATION COMPLEXITY: Moderate    PATIENT EDUCATION:    Education details: Showed pt anatomy images.  Explained muscles attachments/ connection, physiology of deep core system/ spinal- thoracic-pelvis-lower kinetic chain as they relate to pt's presentation, Sx, and past Hx. Explained what and how these areas of deficits need to be restored to balance and function    See Therapeutic activity / neuromuscular re-education section  Answered pt's questions.   Person educated: Patient Education method: Explanation, Demonstration, Tactile cues, Verbal cues, and Handouts Education comprehension: verbalized understanding, returned demonstration, verbal cues required, tactile cues required, and needs further education  PLAN: PT FREQUENCY: 1x/week   PT DURATION: 10 weeks   PLANNED INTERVENTIONS: Therapeutic exercises, Therapeutic activity, Neuromuscular re-education, Balance training, Gait training, Patient/Family education, Self Care, Joint mobilization, Spinal mobilization, Moist heat, Taping, and Manual therapy, dry needling.   PLAN FOR NEXT SESSION: See clinical impression for plan     GOALS: Goals reviewed with patient? Yes  SHORT TERM GOALS: Target date: 08/29/2022    Pt will demo IND with HEP                    Baseline: Not IND            Goal status: MET    LONG TERM GOALS: Target date:  12/18/2022    1.Pt will demo proper deep core coordination without chest breathing and optimal excursion of diaphragm/pelvic floor in order to promote spinal stability and pelvic floor function  Baseline: dyscoordination Goal status:  On going   2.  Pt will demo > 5 pt change on FOTO  to improve QOL and function   Pelvic Pain baseline -33  Bowel  constipation baseline -68 Bowel Leakage baseline -69    Goal status:Ongoing   3.  Pt will demo proper body mechanics in against gravity tasks and ADLs  work tasks, fitness  to minimize straining pelvic floor / back                  Baseline: not IND, improper form that places strain on pelvic floor                Goal status:  On going      4. Pt will walk > 3000 steps before the onset of pain in order to maintain mobility   Baseline: Pt walks 4000 steps  in her yard but feels the pain coming on at 2000 steps  Goal status: On going     5. Pt will be able drive and sit on non-soft cushions for > 30 min with 50% less glut pain in order to participate in community activities  Baseline:  driving and sitting on non-soft cushions caused R glut pain  Goal status: On going   6. Pt will demo increased gait speed > 1.3 m/s in order to ambulate safely in community with decreased risk for falls   Baseline: 1.15 m/s, limited trunk rotation, decreased stance on L, thorax shifted to L) ,  Goal status: MET                         7. Pt will demo levelled pelvic girdle or flank space B with equal fingers width to indicate thoracic shift to medial line and pt and sister will be IND with scoliosis -specific HEP in order to restore mobility at spine, pelvis, gait, posture  Baseline: scoliosis curve: flank space: L 2 fingers, R 4 fingers width between low rib/ iliac crest.) ( thoracic kyphosis/ forward head : 21 cm from earlobe to wall )  Goal Status: PARTIALLY  ( 3 fingers B at flank space, 10/09/22) ( worked on decreasing forward head posture today)    Jerl Mina, PT 12/11/2022, 11:27 AM

## 2022-12-11 NOTE — Patient Instructions (Addendum)
     Mon Tue Wed Thurs Fri  Sat Sun  Neck/ midback stretches SEATED    Seated :  3 sets a day  1) Rainbow with L arm  -10 reps  2) Seated twist, R  hand on L  thigh,  Inhale tall,  Exhale turn L  at navel, heart, look over L shoulder             Lying on your back  Neck roll with towel  - Instead of pillow for this exercise _ Maintain head contact on bed for all 6 movements -10 reps    1 and 2) Chin up, chin down   3 and 4) Rotate ( turning head to change lanes)    5 and 6 )  Side bend (like puppy dog : ear to shoulder)     7) Angel wings   8) Butterfly : open knees on top of pillow  To lengthen pelvic floor             Standing at the countertop     Countertop:    L elbow lean to L     Lunge position R foot back , knee over ankle, inhale, stand tall, twist to R at ribs

## 2022-12-16 DIAGNOSIS — Z9889 Other specified postprocedural states: Secondary | ICD-10-CM | POA: Diagnosis not present

## 2022-12-16 DIAGNOSIS — E782 Mixed hyperlipidemia: Secondary | ICD-10-CM | POA: Diagnosis not present

## 2022-12-16 DIAGNOSIS — Z950 Presence of cardiac pacemaker: Secondary | ICD-10-CM | POA: Diagnosis not present

## 2022-12-16 DIAGNOSIS — I5022 Chronic systolic (congestive) heart failure: Secondary | ICD-10-CM | POA: Diagnosis not present

## 2022-12-16 DIAGNOSIS — I420 Dilated cardiomyopathy: Secondary | ICD-10-CM | POA: Diagnosis not present

## 2022-12-18 ENCOUNTER — Ambulatory Visit: Payer: Medicare HMO | Admitting: Physical Therapy

## 2022-12-18 DIAGNOSIS — M4125 Other idiopathic scoliosis, thoracolumbar region: Secondary | ICD-10-CM

## 2022-12-18 DIAGNOSIS — R2689 Other abnormalities of gait and mobility: Secondary | ICD-10-CM

## 2022-12-18 DIAGNOSIS — R278 Other lack of coordination: Secondary | ICD-10-CM

## 2022-12-18 NOTE — Therapy (Signed)
OUTPATIENT PHYSICAL THERAPY Treatment / Discharge Summary across 16 visits   Patient Name: Doris [name] MRN: [id number] DOB:09/07/1951, 72 y.o., female Today's Date:   PT End of Session - 12/11/22 1123     Visit Number 16    Number of Visits 18    Date for PT Re-Evaluation 12/18/22    PT Start Time 1110    PT Stop Time 1148    PT Time Calculation (min) 38 min    Activity Tolerance Patient tolerated treatment well    Behavior During Therapy WFL for tasks assessed/performed               Past Medical History:  Diagnosis Date   A-fib ([place])    Cancer ([place])    Basal cell ( bridge of her nose)    CHF (congestive heart failure) ([facility])    Headache    Migraines   Hx of migraine headaches    Hypertension    Medical history non-contributory    Mitral valve prolapse at 19 yrs of age   Past Surgical History:  Procedure Laterality Date   CARDIAC CATHETERIZATION     CATARACT EXTRACTION  02/2017   COLONOSCOPY WITH PROPOFOL N/A 03/03/2017   Procedure: COLONOSCOPY WITH PROPOFOL;  Surgeon: Garlan Fair, MD;  Location: [facility];  Service: Endoscopy;  Laterality: N/A;   COLONOSCOPY WITH PROPOFOL N/A 02/18/2022   Procedure: COLONOSCOPY WITH PROPOFOL;  Surgeon: Lesly Rubenstein, MD;  Location: [facility] ENDOSCOPY;  Service: Endoscopy;  Laterality: N/A;   OOPHORECTOMY  2005   Patient Active Problem List   Diagnosis Date Noted   Painful veins 08/07/2018    PCP: Lovie Macadamia MD    REFERRING PROVIDER:Schermerhorn MD  REFERRING DIAG: Pelvic Pain  Rationale for Evaluation and Treatment Rehabilitation  THERAPY DIAG:  Other idiopathic scoliosis, thoracolumbar region   Other abnormalities of gait and mobility   Other lack of coordination  ONSET DATE: 6+ months ago  SUBJECTIVE:                       SUBJECTIVE TODAY  Pt reports she has not noticed the radiating pain from her LBP lately. It stops at the labia  SUBJECTIVE STATEMENT EVAl on 07/31/22             Sister  present and answered some questions for pt.   1) R posterior glut pain started 6 + months ago. Driving stickshift , sitting on non-cushion chairs caused pain. Pain radiates to back of thigh started more recently. Pt walks 4000 steps  in her yard but feels the pain coming on at 2000 steps . Pt used to do 8000 steps prior to this pain.  o 3/10. Currently sitting on plinth 5/10.   Pain 6/10 2) sometimes pain at R by ASIS : 4-5/10 , pt thinks it is related to bowel movements. Pt has Type 1 , 3 stool consistency.  Drinks 3-4 glasses a day. 2 glasses juice, herbal tea, coffee.    PERTINENT HISTORY:  Scoliosis, CA skin, A-fib/ CHF, tachycardia , pacemaker ( total  7 different heart conditions) , recent dementia Dx, twin has Alzheimers   PAIN:  Are you having pain? See above  PRECAUTIONS: No  WEIGHT BEARING RESTRICTIONS: No  FALLS:  Has patient fallen in last 6 months? Yes, fell on the grass   LIVING ENVIRONMENT: Lives with: lives alone  Lives in: House/apartment Stairs: Yes STE 3 with rail only in the back  Has following equipment  at home: No   OCCUPATION: retired Pharmacist, hospital , hobbies: gardening.   PLOF: Independent  PATIENT GOALS:   Not have pain, walk without pain    OBJECTIVE:     [facility] PT Assessment - 12/18/22 1001       Observation/Other Assessments   Observations good carry over with past HEP      Coordination   Coordination and Movement Description reviewed past HEP, pt demo'd good carry over      Bed Mobility   Bed Mobility --   poor carry over with log rolling            [facility] Adult PT Treatment/Exercise - 12/18/22 1001       Therapeutic Activites    Other Therapeutic Activities reviewed past HEP, provided pt's family info about [facility] and [facility]      Neuro Re-ed    Neuro Re-ed Details  cued for glut strenghtening exercises                   HOME EXERCISE PROGRAM: See pt instruction section    ASSESSMENT:  CLINICAL  IMPRESSION:                Pt has met 2 /2 goals and the rest of the goals were deferred.    Pt reports she has not noticed the radiating pain from her LBP lately. It stops at the labia  Pt has demo'd improved posture from scoliosis and thoracic kyphosis with manual Tx and HEP which has helped with centralizing of lumbar pain. Recommended pt to keep applying moisturizer cream on labia for labia pain.   Caregivers have been present for all visits. Videos have been made on pt's phone during past visits to promote compliance.   Therapist has provided caregivers resources to support them and pt as progression of dementia is evident.    Pt is d/c at this time.     OBJECTIVE IMPAIRMENTS decreased activity tolerance, decreased coordination, decreased endurance, decreased mobility, difficulty walking, decreased ROM, decreased strength, decreased safety awareness, hypomobility, increased muscle spasms, impaired flexibility, improper body mechanics, postural dysfunction, and pain.    ACTIVITY LIMITATIONS  self-care,  sleep, home chores, work tasks    PARTICIPATION LIMITATIONS:  community, gardening    PERSONAL FACTORS   scoliosis, recent dementia Dx, lives alone are factors also affecting patient's functional outcome.    REHAB POTENTIAL: Good   CLINICAL DECISION MAKING: Evolving/moderate complexity   EVALUATION COMPLEXITY: Moderate    PATIENT EDUCATION:    Education details: Showed pt anatomy images. Explained muscles attachments/ connection, physiology of deep core system/ spinal- thoracic-pelvis-lower kinetic chain as they relate to pt's presentation, Sx, and past Hx. Explained what and how these areas of deficits need to be restored to balance and function    See Therapeutic activity / neuromuscular re-education section  Answered pt's questions.   Person educated: Patient Education method: Explanation, Demonstration, Tactile cues, Verbal cues, and Handouts Education comprehension:  verbalized understanding, returned demonstration, verbal cues required, tactile cues required, and needs further education     PLAN: PT FREQUENCY: 1x/week   PT DURATION: 10 weeks   PLANNED INTERVENTIONS: Therapeutic exercises, Therapeutic activity, Neuromuscular re-education, Balance training, Gait training, Patient/Family education, Self Care, Joint mobilization, Spinal mobilization, Moist heat, Taping, and Manual therapy, dry needling.   PLAN FOR NEXT SESSION: See clinical impression for plan     GOALS: Goals reviewed with patient? Yes  SHORT TERM GOALS: Target date: 08/29/2022    Pt  will demo IND with HEP                    Baseline: Not IND            Goal status: MET    LONG TERM GOALS: Target date:  12/18/2022    1.Pt will demo proper deep core coordination without chest breathing and optimal excursion of diaphragm/pelvic floor in order to promote spinal stability and pelvic floor function  Baseline: dyscoordination Goal status:  deferred   2.  Pt will demo > 5 pt change on FOTO  to improve QOL and function   Pelvic Pain baseline -33  Bowel  constipation baseline -68 Bowel Leakage baseline -69    Goal status: deferred  3.  Pt will demo proper body mechanics in against gravity tasks and ADLs  work tasks, fitness  to minimize straining pelvic floor / back                  Baseline: not IND, improper form that places strain on pelvic floor                Goal status:  On going     4. Pt will walk > 3000 steps before the onset of pain in order to maintain mobility   Baseline: Pt walks 4000 steps  in her yard but feels the pain coming on at 2000 steps  Goal status: On going  (deferred)     5. Pt will be able drive and sit on non-soft cushions for > 30 min with 50% less glut pain in order to participate in community activities  Baseline:  driving and sitting on non-soft cushions caused R glut pain  Goal status: deferred ( not driving)    6. Pt will demo  increased gait speed > 1.3 m/s in order to ambulate safely in community with decreased risk for falls   Baseline: 1.15 m/s, limited trunk rotation, decreased stance on L, thorax shifted to L) ,  Goal status: MET                         7. Pt will demo levelled pelvic girdle or flank space B with equal fingers width to indicate thoracic shift to medial line and pt and sister will be IND with scoliosis -specific HEP in order to restore mobility at spine, pelvis, gait, posture  Baseline: scoliosis curve: flank space: L 2 fingers, R 4 fingers width between low rib/ iliac crest.) ( thoracic kyphosis/ forward head : 21 cm from earlobe to wall )  Goal Status: MET   ( 3 fingers B at flank space)   Jerl Mina, PT 12/11/2022, 11:27 AM

## 2022-12-18 NOTE — Patient Instructions (Addendum)
  Sit to stand  Scoot to front part of chair chair Heels behind knees, feet are hip width apart, nose over toes  Inhale like you are smelling roses Exhale to stand   10 reps  X 3 x different times of day __  Side stepping along the kitchen counter L and R  = 1 rep  5 reps    ___  Stand perpendicular to the counter and step back  10 reps  X 3 x different times of day

## 2022-12-27 ENCOUNTER — Other Ambulatory Visit: Payer: Self-pay | Admitting: Internal Medicine

## 2023-01-27 DIAGNOSIS — F411 Generalized anxiety disorder: Secondary | ICD-10-CM | POA: Diagnosis not present

## 2023-01-27 DIAGNOSIS — R413 Other amnesia: Secondary | ICD-10-CM | POA: Diagnosis not present

## 2023-01-27 DIAGNOSIS — G479 Sleep disorder, unspecified: Secondary | ICD-10-CM | POA: Diagnosis not present

## 2023-02-03 ENCOUNTER — Ambulatory Visit (INDEPENDENT_AMBULATORY_CARE_PROVIDER_SITE_OTHER): Payer: Medicare HMO

## 2023-02-03 DIAGNOSIS — I442 Atrioventricular block, complete: Secondary | ICD-10-CM | POA: Diagnosis not present

## 2023-02-04 LAB — CUP PACEART REMOTE DEVICE CHECK
Battery Remaining Longevity: 117 mo
Battery Voltage: 3 V
Brady Statistic AP VP Percent: 0.43 %
Brady Statistic AP VS Percent: 7.19 %
Brady Statistic AS VP Percent: 0.03 %
Brady Statistic AS VS Percent: 92.34 %
Brady Statistic RA Percent Paced: 8.05 %
Brady Statistic RV Percent Paced: 0.46 %
Date Time Interrogation Session: 20240515024101
Implantable Lead Connection Status: 753985
Implantable Lead Connection Status: 753985
Implantable Lead Connection Status: 753985
Implantable Lead Implant Date: 20190805
Implantable Lead Implant Date: 20190805
Implantable Lead Implant Date: 20190805
Implantable Lead Location: 753858
Implantable Lead Location: 753859
Implantable Lead Location: 753860
Implantable Lead Model: 4398
Implantable Lead Model: 5076
Implantable Lead Model: 5076
Implantable Lead Serial Number: 11111
Implantable Pulse Generator Implant Date: 20190805
Lead Channel Impedance Value: 1045 Ohm
Lead Channel Impedance Value: 1083 Ohm
Lead Channel Impedance Value: 323 Ohm
Lead Channel Impedance Value: 380 Ohm
Lead Channel Impedance Value: 418 Ohm
Lead Channel Impedance Value: 437 Ohm
Lead Channel Impedance Value: 513 Ohm
Lead Channel Impedance Value: 589 Ohm
Lead Channel Impedance Value: 646 Ohm
Lead Channel Impedance Value: 684 Ohm
Lead Channel Impedance Value: 836 Ohm
Lead Channel Impedance Value: 855 Ohm
Lead Channel Impedance Value: 893 Ohm
Lead Channel Impedance Value: 931 Ohm
Lead Channel Pacing Threshold Amplitude: 0.625 V
Lead Channel Pacing Threshold Amplitude: 0.75 V
Lead Channel Pacing Threshold Amplitude: 1.125 V
Lead Channel Pacing Threshold Pulse Width: 0.4 ms
Lead Channel Pacing Threshold Pulse Width: 0.4 ms
Lead Channel Pacing Threshold Pulse Width: 0.4 ms
Lead Channel Sensing Intrinsic Amplitude: 15.375 mV
Lead Channel Sensing Intrinsic Amplitude: 15.375 mV
Lead Channel Sensing Intrinsic Amplitude: 2.75 mV
Lead Channel Sensing Intrinsic Amplitude: 2.75 mV
Lead Channel Setting Pacing Amplitude: 1.5 V
Lead Channel Setting Pacing Amplitude: 2 V
Lead Channel Setting Pacing Pulse Width: 0.4 ms
Lead Channel Setting Sensing Sensitivity: 2 mV
Zone Setting Status: 755011
Zone Setting Status: 755011

## 2023-02-13 DIAGNOSIS — M545 Low back pain, unspecified: Secondary | ICD-10-CM | POA: Diagnosis not present

## 2023-02-13 DIAGNOSIS — R309 Painful micturition, unspecified: Secondary | ICD-10-CM | POA: Diagnosis not present

## 2023-02-26 ENCOUNTER — Telehealth: Payer: Self-pay | Admitting: Internal Medicine

## 2023-02-26 DIAGNOSIS — F02A0 Dementia in other diseases classified elsewhere, mild, without behavioral disturbance, psychotic disturbance, mood disturbance, and anxiety: Secondary | ICD-10-CM | POA: Diagnosis not present

## 2023-02-26 DIAGNOSIS — M25552 Pain in left hip: Secondary | ICD-10-CM | POA: Diagnosis not present

## 2023-02-26 DIAGNOSIS — M419 Scoliosis, unspecified: Secondary | ICD-10-CM | POA: Diagnosis not present

## 2023-02-26 DIAGNOSIS — R102 Pelvic and perineal pain: Secondary | ICD-10-CM | POA: Diagnosis not present

## 2023-02-26 DIAGNOSIS — G8929 Other chronic pain: Secondary | ICD-10-CM | POA: Diagnosis not present

## 2023-02-26 DIAGNOSIS — R413 Other amnesia: Secondary | ICD-10-CM | POA: Diagnosis not present

## 2023-02-26 DIAGNOSIS — G309 Alzheimer's disease, unspecified: Secondary | ICD-10-CM | POA: Diagnosis not present

## 2023-02-26 NOTE — Telephone Encounter (Signed)
There are no contraindications with wearing a life alert and having a Pacemaker.   Patient made aware.

## 2023-02-26 NOTE — Telephone Encounter (Signed)
Patient is asking to speak to nurse, to see if it would be okay for her to wear an life alert. Please advise

## 2023-03-03 NOTE — Progress Notes (Signed)
Remote pacemaker transmission.   

## 2023-03-18 DIAGNOSIS — M545 Low back pain, unspecified: Secondary | ICD-10-CM | POA: Diagnosis not present

## 2023-03-18 DIAGNOSIS — R413 Other amnesia: Secondary | ICD-10-CM | POA: Diagnosis not present

## 2023-03-19 DIAGNOSIS — R413 Other amnesia: Secondary | ICD-10-CM | POA: Insufficient documentation

## 2023-03-19 DIAGNOSIS — F0390 Unspecified dementia without behavioral disturbance: Secondary | ICD-10-CM | POA: Insufficient documentation

## 2023-03-29 ENCOUNTER — Other Ambulatory Visit: Payer: Self-pay

## 2023-03-29 ENCOUNTER — Encounter: Payer: Self-pay | Admitting: Emergency Medicine

## 2023-03-29 ENCOUNTER — Emergency Department
Admission: EM | Admit: 2023-03-29 | Discharge: 2023-03-29 | Disposition: A | Discharge status: Home / Self Care | Payer: Medicare HMO | Attending: Emergency Medicine | Admitting: Emergency Medicine

## 2023-03-29 DIAGNOSIS — R300 Dysuria: Secondary | ICD-10-CM | POA: Insufficient documentation

## 2023-03-29 DIAGNOSIS — Z87891 Personal history of nicotine dependence: Secondary | ICD-10-CM | POA: Insufficient documentation

## 2023-03-29 DIAGNOSIS — E860 Dehydration: Secondary | ICD-10-CM

## 2023-03-29 DIAGNOSIS — R002 Palpitations: Secondary | ICD-10-CM | POA: Diagnosis not present

## 2023-03-29 DIAGNOSIS — F419 Anxiety disorder, unspecified: Secondary | ICD-10-CM | POA: Diagnosis not present

## 2023-03-29 DIAGNOSIS — Z85828 Personal history of other malignant neoplasm of skin: Secondary | ICD-10-CM | POA: Insufficient documentation

## 2023-03-29 DIAGNOSIS — I110 Hypertensive heart disease with heart failure: Secondary | ICD-10-CM | POA: Insufficient documentation

## 2023-03-29 DIAGNOSIS — R531 Weakness: Secondary | ICD-10-CM | POA: Diagnosis not present

## 2023-03-29 DIAGNOSIS — I509 Heart failure, unspecified: Secondary | ICD-10-CM | POA: Insufficient documentation

## 2023-03-29 DIAGNOSIS — Z743 Need for continuous supervision: Secondary | ICD-10-CM | POA: Diagnosis not present

## 2023-03-29 DIAGNOSIS — I959 Hypotension, unspecified: Secondary | ICD-10-CM | POA: Diagnosis not present

## 2023-03-29 DIAGNOSIS — R42 Dizziness and giddiness: Secondary | ICD-10-CM | POA: Diagnosis not present

## 2023-03-29 LAB — CBC WITH DIFFERENTIAL/PLATELET
Abs Immature Granulocytes: 0.04 10*3/uL (ref 0.00–0.07)
Basophils Absolute: 0.1 10*3/uL (ref 0.0–0.1)
Basophils Relative: 1 %
Eosinophils Absolute: 0.2 10*3/uL (ref 0.0–0.5)
Eosinophils Relative: 2 %
HCT: 40.3 % (ref 36.0–46.0)
Hemoglobin: 13.2 g/dL (ref 12.0–15.0)
Immature Granulocytes: 1 %
Lymphocytes Relative: 18 %
Lymphs Abs: 1.4 10*3/uL (ref 0.7–4.0)
MCH: 30.6 pg (ref 26.0–34.0)
MCHC: 32.8 g/dL (ref 30.0–36.0)
MCV: 93.5 fL (ref 80.0–100.0)
Monocytes Absolute: 0.6 10*3/uL (ref 0.1–1.0)
Monocytes Relative: 7 %
Neutro Abs: 5.7 10*3/uL (ref 1.7–7.7)
Neutrophils Relative %: 71 %
Platelets: 316 10*3/uL (ref 150–400)
RBC: 4.31 MIL/uL (ref 3.87–5.11)
RDW: 12.9 % (ref 11.5–15.5)
WBC: 7.9 10*3/uL (ref 4.0–10.5)
nRBC: 0 % (ref 0.0–0.2)

## 2023-03-29 LAB — COMPREHENSIVE METABOLIC PANEL
ALT: 42 U/L (ref 0–44)
AST: 41 U/L (ref 15–41)
Albumin: 4.2 g/dL (ref 3.5–5.0)
Alkaline Phosphatase: 65 U/L (ref 38–126)
Anion gap: 10 (ref 5–15)
BUN: 22 mg/dL (ref 8–23)
CO2: 25 mmol/L (ref 22–32)
Calcium: 9.6 mg/dL (ref 8.9–10.3)
Chloride: 96 mmol/L — ABNORMAL LOW (ref 98–111)
Creatinine, Ser: 0.71 mg/dL (ref 0.44–1.00)
GFR, Estimated: >60 mL/min (ref >60)
Glucose, Bld: 91 mg/dL (ref 70–99)
Potassium: 3.7 mmol/L (ref 3.5–5.1)
Sodium: 131 mmol/L — ABNORMAL LOW (ref 135–145)
Total Bilirubin: 0.8 mg/dL (ref 0.3–1.2)
Total Protein: 7.1 g/dL (ref 6.5–8.1)

## 2023-03-29 LAB — URINALYSIS, ROUTINE W REFLEX MICROSCOPIC
Bilirubin Urine: NEGATIVE
Glucose, UA: NEGATIVE mg/dL
Hgb urine dipstick: NEGATIVE
Ketones, ur: 20 mg/dL — AB
Leukocytes,Ua: NEGATIVE
Nitrite: NEGATIVE
Protein, ur: NEGATIVE mg/dL
Specific Gravity, Urine: 1.018 (ref 1.005–1.030)
pH: 8 (ref 5.0–8.0)

## 2023-03-29 LAB — TROPONIN I (HIGH SENSITIVITY)
Troponin I (High Sensitivity): 6 ng/L (ref <18)
Troponin I (High Sensitivity): 5 ng/L (ref <18)

## 2023-03-29 MED ORDER — SODIUM CHLORIDE 0.9 % IV BOLUS
1000.0000 mL | Freq: Once | INTRAVENOUS | Status: AC
  Administered 2023-03-29: 1000 mL via INTRAVENOUS

## 2023-03-29 NOTE — ED Triage Notes (Signed)
First Nurse Note;  Pt via [facility] from home. Pt called out for lightheadness and dizziness for the last 2 hours. Pt is A&Ox4 and NAD 132/56 BP  80 HR  100% on RA

## 2023-03-29 NOTE — ED Triage Notes (Signed)
Pt sts that last night she had a large BM and had to dig herself out. Pt sts that since than she has been shaking. Pt is having a flight of ideas and is difficult to hold a steady conversation. RN asked pt if she has anxiety and she sts I dont know why I would be.

## 2023-03-29 NOTE — ED Provider Notes (Signed)
[facility] Emergency Department Provider Note   ____________________________________________   Event Date/Time   First MD Initiated Contact with Patient 03/29/23 1423     (approximate)  I have reviewed the triage vital signs and the nursing notes.   HISTORY  Chief Complaint Anxiety    HPI Doris [name] is a 72 y.o. female presents to the emergency room for complaint of feeling shaky/having feelings that her heart is racing with palpitations.  Patient reports that earlier this morning she had a large BM that required her to disimpact herself.  After that time she became quite anxious/nervous.  She reports that several hours later she started to shake/shiver and became short of breath, patient reports that over the last week she has also had some burning with urination.  Lightheaded, and had feelings that her heart was racing/palpitations.  Patient does have history of A-fib.  At this time patient denies any pain other than her chronic.   Past Medical History:  Diagnosis Date   A-fib ([facility])    Cancer ([facility])    Basal cell ( bridge of her nose)    CHF (congestive heart failure) (HCC)    Headache    Migraines   Hx of migraine headaches    Hypertension    Medical history non-contributory    Mitral valve prolapse at 71 yrs of age    Patient Active Problem List   Diagnosis Date Noted   Painful veins 08/07/2018    Past Surgical History:  Procedure Laterality Date   CARDIAC CATHETERIZATION     CATARACT EXTRACTION  02/2017   COLONOSCOPY WITH PROPOFOL N/A 03/03/2017   Procedure: COLONOSCOPY WITH PROPOFOL;  Surgeon: Charolett Bumpers, MD;  Location: [facility];  Service: Endoscopy;  Laterality: N/A;   COLONOSCOPY WITH PROPOFOL N/A 02/18/2022   Procedure: COLONOSCOPY WITH PROPOFOL;  Surgeon: Regis Bill, MD;  Location: [facility] END[facility]COPY;  Service: Endoscopy;  Laterality: N/A;   OOPHORECTOMY  2005    Prior to Admission medications   Medication  Sig Start Date End Date Taking? Authorizing Provider  b complex vitamins tablet Take 0.5 tablets by mouth daily.    [provider]  bisoprolol (ZEBETA) 5 MG tablet TAKE 1/2 TABLET BY MOUTH EVERY DAY 05/09/22   Duke Salvia, MD  Cholecalciferol (VITAMIN D3) LIQD Take 1 drop by mouth daily.     [provider]  empagliflozin (JARDIANCE) 10 MG TABS tablet Take 1 tablet (10 mg total) by mouth daily before breakfast. 05/08/22   Duke Salvia, MD  Krill Oil 350 MG CAPS Take 350 mg by mouth.    [provider]  Multiple Vitamins-Minerals (ICAPS) CAPS Take by mouth daily.     [provider]  Polyethyl Glycol-Propyl Glycol (SYSTANE ULTRA OP) Place 1 drop into the right eye every 2 (two) hours as needed (dry eyes).    [provider]  spironolactone (ALDACTONE) 25 MG tablet TAKE 1/2 TABLET BY MOUTH DAILY 12/29/22   Duke Salvia, MD    Allergies Benzocaine, Carboxymethylcellulose, Clarithromycin, Doxycycline, Fentanyl, Lidocaine, Metronidazole, Monosodium glutamate, and Penicillins  Family History  Problem Relation Age of Onset   Diabetes Mother    Cancer Mother    Cancer Father     Social History Social History   Tobacco Use   Smoking status: Former    Types: Cigarettes    Quit date: 1990    Years since quitting: 34.5   Smokeless tobacco: Never  Vaping Use  Vaping Use: Never used  Substance Use Topics   Alcohol use: Not Currently    Comment: Beer a month   Drug use: No    Review of Systems  Constitutional: No fever/chills Eyes: No visual changes. Cardiovascular: Positive for chest pain/heart racing/palpitations. Respiratory: Positive for complaint of shortness of breath. Gastrointestinal: No abdominal pain.  No nausea, no vomiting.  No diarrhea.  Constipation earlier today relieved by disimpaction. Genitourinary: Positive for burning with urination. Musculoskeletal: Negative for back pain. Skin: Negative for rash. Neurological:  Negative for headaches, focal weakness or numbness.   ____________________________________________   PHYSICAL EXAM:  VITAL SIGNS: ED Triage Vitals [03/29/23 1339]  Enc Vitals Group     BP (!) 117/57     Pulse Rate (!) 59     Resp 17     Temp 98.2 F (36.8 C)     Temp Source Oral     SpO2 100 %     Weight 130 lb (59 kg)     Height 5\' 3"  (1.6 m)     Head Circumference      Peak Flow      Pain Score 0     Pain Loc      Pain Edu?      Excl. in GC?     Constitutional: Alert and oriented with slight cognitive impairment per family member in room with patient.. Well appearing and in no acute distress. Eyes: Conjunctivae are normal. PERRL. EOMI. Head: Atraumatic. Mouth/Throat: Mucous membranes are moist.   Cardiovascular: Normal rate, regular rhythm. Grossly normal heart sounds.  Good peripheral circulation. Respiratory: Normal respiratory effort.  No retractions. Lungs CTAB. Gastrointestinal: Soft and nontender. No distention. No abdominal bruits. No CVA tenderness. Musculoskeletal: No lower extremity tenderness nor edema.  No joint effusions.  No edema/swelling noted to bilateral lower extremities. Neurologic:  Normal speech and language. No gross focal neurologic deficits are appreciated.  Skin:  Skin is warm, dry and intact. No rash noted. Psychiatric: Mood and affect are normal. Speech and behavior are normal.  ____________________________________________   LABS (all labs ordered are listed, but only abnormal results are displayed)  Labs Reviewed  COMPREHENSIVE METABOLIC PANEL - Abnormal; Notable for the following components:      Result Value   Sodium 131 (*)    Chloride 96 (*)    All other components within normal limits  URINALYSIS, ROUTINE W REFLEX MICROSCOPIC - Abnormal; Notable for the following components:   Color, Urine YELLOW (*)    APPearance HAZY (*)    Ketones, ur 20 (*)    All other components within normal limits  CBC WITH DIFFERENTIAL/PLATELET   TROPONIN I (HIGH SENSITIVITY)  TROPONIN I (HIGH SENSITIVITY)   ____________________________________________  EKG  EKG was obtained and reviewed/read by Dr. Marisa Severin ____________________________________________  RADIOLOGY  ED MD interpretation:    Official radiology report(s): No results found.  ____________________________________________   PROCEDURES  Procedure(s) performed: None  Procedures  Critical Care performed: No  ____________________________________________   INITIAL IMPRESSION / ASSESSMENT AND PLAN / ED COURSE    Doris [name] is a 72 y.o. female presents to the emergency room for complaint of feeling shaky/having feelings that her heart is racing with palpitations.  Patient reports that earlier this morning she had a large BM that required her to disimpact herself.  After that time she became quite anxious/nervous.  She reports that several hours later she started to shake/shiver and became short of breath, patient reports that over the last week  she has also had some burning with urination.  Lightheaded, and had feelings that her heart was racing/palpitations.  Patient does have history of A-fib.  At this time patient denies any pain other than her chronic.   Exam is reassuring for no signs of active A-fib or congestive heart failure. Will obtain CBC, CMP, troponin, EKG, and urine .  EKG was reviewed by Dr. Marisa Severin with no emergent abnormalities noted.  CBC normal.  CMP shows decreased sodium and decreased chloride.  Troponin is normal.  Urinalysis reveals ketones. Based on these findings slight dehydration is noted and will provide patient with saline bolus and reevaluate with plan to discharge home  On reevaluation, patient reports that she is not having any chest pain, shortness of breath, dizziness, anxiety type symptoms. Once saline bolus has completely infused patient will be discharged home in stable condition at this time.      ____________________________________________   FINAL CLINICAL IMPRESSION(S) / ED DIAGNOSES  Final diagnoses:  Anxiety  Palpitations  Mild dehydration     ED Discharge Orders     None        Note:  This document was prepared using Dragon voice recognition software and may include unintentional dictation errors.     Herschell Dimes, NP 03/29/23 1734    Phineas Semen, MD 03/31/23 [phone]

## 2023-03-29 NOTE — ED Provider Notes (Signed)
ED ECG REPORT I, Dionne Bucy, the attending physician, personally viewed and interpreted this ECG.  Date: 03/29/2023 EKG Time: 1517 Rate: 60 Rhythm: Atrial paced rhythm QRS Axis: normal Intervals: normal ST/T Wave abnormalities: Nonspecific T wave abnormality Narrative Interpretation: no evidence of acute ischemia    Dionne Bucy, MD 03/29/23 1525

## 2023-03-29 NOTE — Discharge Instructions (Signed)
You have been seen today in the emergency room for complaint of dizziness, shortness of breath, palpitations and possible anxiety reaction after removing impaction. Your overall lab work was reassuring with the exception of slight dehydration.  You were provided a saline bolus to correct this.  I encourage you to drink at least 64 ounces of water a day.  This will help not only with hydration status but also with constipation. Follow-up with your primary care provider if symptoms return.

## 2023-04-11 ENCOUNTER — Other Ambulatory Visit: Payer: Self-pay | Admitting: Internal Medicine

## 2023-04-11 DIAGNOSIS — I429 Cardiomyopathy, unspecified: Secondary | ICD-10-CM

## 2023-04-26 DIAGNOSIS — M810 Age-related osteoporosis without current pathological fracture: Secondary | ICD-10-CM | POA: Diagnosis not present

## 2023-04-26 DIAGNOSIS — F32A Depression, unspecified: Secondary | ICD-10-CM | POA: Diagnosis not present

## 2023-04-26 DIAGNOSIS — K219 Gastro-esophageal reflux disease without esophagitis: Secondary | ICD-10-CM | POA: Diagnosis not present

## 2023-04-26 DIAGNOSIS — M5440 Lumbago with sciatica, unspecified side: Secondary | ICD-10-CM | POA: Diagnosis not present

## 2023-04-26 DIAGNOSIS — K5900 Constipation, unspecified: Secondary | ICD-10-CM | POA: Diagnosis not present

## 2023-04-26 DIAGNOSIS — H3530 Unspecified macular degeneration: Secondary | ICD-10-CM | POA: Diagnosis not present

## 2023-04-26 DIAGNOSIS — M1990 Unspecified osteoarthritis, unspecified site: Secondary | ICD-10-CM | POA: Diagnosis not present

## 2023-04-26 DIAGNOSIS — E785 Hyperlipidemia, unspecified: Secondary | ICD-10-CM | POA: Diagnosis not present

## 2023-04-26 DIAGNOSIS — G309 Alzheimer's disease, unspecified: Secondary | ICD-10-CM | POA: Diagnosis not present

## 2023-04-26 DIAGNOSIS — G629 Polyneuropathy, unspecified: Secondary | ICD-10-CM | POA: Diagnosis not present

## 2023-04-26 DIAGNOSIS — R32 Unspecified urinary incontinence: Secondary | ICD-10-CM | POA: Diagnosis not present

## 2023-04-26 DIAGNOSIS — M4800 Spinal stenosis, site unspecified: Secondary | ICD-10-CM | POA: Diagnosis not present

## 2023-04-29 DIAGNOSIS — Z022 Encounter for examination for admission to residential institution: Secondary | ICD-10-CM | POA: Diagnosis not present

## 2023-05-05 ENCOUNTER — Ambulatory Visit (INDEPENDENT_AMBULATORY_CARE_PROVIDER_SITE_OTHER): Payer: Medicare HMO

## 2023-05-05 DIAGNOSIS — I442 Atrioventricular block, complete: Secondary | ICD-10-CM | POA: Diagnosis not present

## 2023-05-06 LAB — CUP PACEART REMOTE DEVICE CHECK
Battery Remaining Longevity: 115 mo
Battery Voltage: 3 V
Brady Statistic AP VP Percent: 0.51 %
Brady Statistic AP VS Percent: 9.7 %
Brady Statistic AS VP Percent: 0.03 %
Brady Statistic AS VS Percent: 89.76 %
Brady Statistic RA Percent Paced: 10.2 %
Brady Statistic RV Percent Paced: 0.54 %
Date Time Interrogation Session: 20240814012123
Implantable Lead Connection Status: 753985
Implantable Lead Connection Status: 753985
Implantable Lead Connection Status: 753985
Implantable Lead Implant Date: 20190805
Implantable Lead Implant Date: 20190805
Implantable Lead Implant Date: 20190805
Implantable Lead Location: 753858
Implantable Lead Location: 753859
Implantable Lead Location: 753860
Implantable Lead Model: 4398
Implantable Lead Model: 5076
Implantable Lead Model: 5076
Implantable Lead Serial Number: 11111
Implantable Pulse Generator Implant Date: 20190805
Lead Channel Impedance Value: 285 Ohm
Lead Channel Impedance Value: 361 Ohm
Lead Channel Impedance Value: 361 Ohm
Lead Channel Impedance Value: 361 Ohm
Lead Channel Impedance Value: 456 Ohm
Lead Channel Impedance Value: 513 Ohm
Lead Channel Impedance Value: 551 Ohm
Lead Channel Impedance Value: 589 Ohm
Lead Channel Impedance Value: 760 Ohm
Lead Channel Impedance Value: 779 Ohm
Lead Channel Impedance Value: 798 Ohm
Lead Channel Impedance Value: 817 Ohm
Lead Channel Impedance Value: 969 Ohm
Lead Channel Impedance Value: 988 Ohm
Lead Channel Pacing Threshold Amplitude: 0.625 V
Lead Channel Pacing Threshold Amplitude: 0.75 V
Lead Channel Pacing Threshold Amplitude: 1.125 V
Lead Channel Pacing Threshold Pulse Width: 0.4 ms
Lead Channel Pacing Threshold Pulse Width: 0.4 ms
Lead Channel Pacing Threshold Pulse Width: 0.4 ms
Lead Channel Sensing Intrinsic Amplitude: 1.25 mV
Lead Channel Sensing Intrinsic Amplitude: 1.25 mV
Lead Channel Sensing Intrinsic Amplitude: 11.5 mV
Lead Channel Sensing Intrinsic Amplitude: 11.5 mV
Lead Channel Setting Pacing Amplitude: 1.5 V
Lead Channel Setting Pacing Amplitude: 2 V
Lead Channel Setting Pacing Pulse Width: 0.4 ms
Lead Channel Setting Sensing Sensitivity: 2 mV
Zone Setting Status: 755011
Zone Setting Status: 755011

## 2023-05-19 DIAGNOSIS — Z961 Presence of intraocular lens: Secondary | ICD-10-CM | POA: Diagnosis not present

## 2023-05-19 DIAGNOSIS — H353131 Nonexudative age-related macular degeneration, bilateral, early dry stage: Secondary | ICD-10-CM | POA: Diagnosis not present

## 2023-05-20 NOTE — Progress Notes (Signed)
Remote pacemaker transmission.   

## 2023-05-27 DIAGNOSIS — G588 Other specified mononeuropathies: Secondary | ICD-10-CM | POA: Diagnosis not present

## 2023-05-27 DIAGNOSIS — R102 Pelvic and perineal pain: Secondary | ICD-10-CM | POA: Diagnosis not present

## 2023-06-10 DIAGNOSIS — Q763 Congenital scoliosis due to congenital bony malformation: Secondary | ICD-10-CM | POA: Diagnosis not present

## 2023-06-10 DIAGNOSIS — M810 Age-related osteoporosis without current pathological fracture: Secondary | ICD-10-CM | POA: Diagnosis not present

## 2023-06-10 DIAGNOSIS — I509 Heart failure, unspecified: Secondary | ICD-10-CM | POA: Diagnosis not present

## 2023-06-10 DIAGNOSIS — E559 Vitamin D deficiency, unspecified: Secondary | ICD-10-CM | POA: Diagnosis not present

## 2023-06-10 DIAGNOSIS — K5900 Constipation, unspecified: Secondary | ICD-10-CM | POA: Diagnosis not present

## 2023-06-10 DIAGNOSIS — R600 Localized edema: Secondary | ICD-10-CM | POA: Diagnosis not present

## 2023-06-10 DIAGNOSIS — G894 Chronic pain syndrome: Secondary | ICD-10-CM | POA: Diagnosis not present

## 2023-06-10 DIAGNOSIS — D519 Vitamin B12 deficiency anemia, unspecified: Secondary | ICD-10-CM | POA: Diagnosis not present

## 2023-06-18 DIAGNOSIS — I5022 Chronic systolic (congestive) heart failure: Secondary | ICD-10-CM | POA: Diagnosis not present

## 2023-06-18 DIAGNOSIS — Z9889 Other specified postprocedural states: Secondary | ICD-10-CM | POA: Diagnosis not present

## 2023-06-18 DIAGNOSIS — I420 Dilated cardiomyopathy: Secondary | ICD-10-CM | POA: Diagnosis not present

## 2023-06-18 DIAGNOSIS — E782 Mixed hyperlipidemia: Secondary | ICD-10-CM | POA: Diagnosis not present

## 2023-06-18 DIAGNOSIS — Z950 Presence of cardiac pacemaker: Secondary | ICD-10-CM | POA: Diagnosis not present

## 2023-06-24 ENCOUNTER — Ambulatory Visit: Payer: Medicare HMO | Attending: Cardiology | Admitting: Cardiology

## 2023-06-24 NOTE — Progress Notes (Deleted)
Cardiology Office Note Date:  06/24/2023  Patient ID:  Doris [name], Doris [name] 1951-07-03, MRN [id number] PCP:  Dorothey Baseman, MD  Cardiologist:  Marcina Millard, MD Electrophysiologist: Sherryl Manges, MD  ***refresh   Chief Complaint: yearly device follow-up  History of Present Illness: Doris [name] is a 72 y.o. 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  She last saw Dr. Graciela Husbands 04/2022. Some discussion regarding upgrade of device to CRT-D given reduced ejection fraction.   On follow-up today,  ** fluid status, exercise tolerance  - consider increasing spiro to 25mg   Since last being seen in our clinic the patient reports doing ***.  she denies chest pain, palpitations, dyspnea, PND, orthopnea, nausea, vomiting, dizziness, syncope, edema, weight gain, or early satiety.     Device Information: MDT CRT-P, imp 04/2018; dx CHB  Past Medical History:  Diagnosis Date   A-fib (HCC)    Cancer (HCC)    Basal cell ( bridge of her nose)    CHF (congestive heart failure) (HCC)    Headache    Migraines   Hx of migraine headaches    Hypertension    Medical history non-contributory    Mitral valve prolapse at 19 yrs of age    Past Surgical History:  Procedure Laterality Date   CARDIAC CATHETERIZATION     CATARACT EXTRACTION  02/2017   COLONOSCOPY WITH PROPOFOL N/A 03/03/2017   Procedure: COLONOSCOPY WITH PROPOFOL;  Surgeon: Charolett Bumpers, MD;  Location: [facility];  Service: Endoscopy;  Laterality: N/A;   COLONOSCOPY WITH PROPOFOL N/A 02/18/2022   Procedure: COLONOSCOPY WITH PROPOFOL;  Surgeon: Regis Bill, MD;  Location: [facility] ENDOSCOPY;  Service: Endoscopy;  Laterality: N/A;   OOPHORECTOMY  2005    Current Outpatient Medications  Medication Instructions   b complex vitamins tablet 0.5 tablets, Oral, Daily   bisoprolol (ZEBETA) 2.5 mg, Oral, Daily    Cholecalciferol (VITAMIN D3) LIQD 1 drop, Oral, Daily   empagliflozin (JARDIANCE) 10 mg, Oral, Daily before breakfast   Krill Oil 350 mg, Oral   Multiple Vitamins-Minerals (ICAPS) CAPS Oral, Daily   Polyethyl Glycol-Propyl Glycol (SYSTANE ULTRA OP) 1 drop, Right Eye, Every 2 hours PRN   spironolactone (ALDACTONE) 12.5 mg, Oral, Daily    Social History:  The patient  reports that she quit smoking about 34 years ago. Her smoking use included cigarettes. She has never used smokeless tobacco. She reports that she does not currently use alcohol. She reports that she does not use drugs.   Family History:  *** include only if pertinent The patient's family history includes Cancer in her father and mother; Diabetes in her mother.***  ROS:  Please see the history of present illness. All other systems are reviewed and otherwise negative.   PHYSICAL EXAM: *** VS:  There were no vitals taken for this visit. BMI: There is no height or weight on file to calculate BMI.  GEN- The patient is well appearing, alert and oriented x 3 today.   Lungs- Clear to ausculation bilaterally, normal work of breathing.  Heart- {Blank single:19197::"Regular","Irregularly irregular"} rate and rhythm, no murmurs, rubs or gallops Extremities- {EDEMA LEVEL:28147::"No"} peripheral edema, warm, dry Skin-  *** device pocket well-healed, no tethering   Device interrogation done today and reviewed by myself:  Battery *** Lead thresholds, impedence, sensing stable *** VP ***% *** episodes *** changes made today  EKG is ordered. Personal review  of EKG from today shows:  ***        Recent Labs: 03/29/2023: ALT 42; BUN 22; Creatinine, Ser 0.71; Hemoglobin 13.2; Platelets 316; Potassium 3.7; Sodium 131  No results found for requested labs within last 365 days.   CrCl cannot be calculated (Patient's most recent lab result is older than the maximum 21 days allowed.).   Wt Readings from Last 3 Encounters:  03/29/23 130 lb  (59 kg)  05/06/22 123 lb (55.8 kg)  02/18/22 120 lb (54.4 kg)     Additional studies reviewed include: Previous EP, cardiology notes.   TTE, 03/11/2022 (CE) MODERATE LV SYSTOLIC DYSFUNCTION (See above) -- 35% NORMAL RIGHT VENTRICULAR SYSTOLIC FUNCTION  MILD VALVULAR REGURGITATION (See above)  NO VALVULAR STENOSIS  S/p MV repair with 32 mm Simulus ring 2019- trivial MR, no stenosis (mean gradient 3  mmHg)  Mild RV dilation with mild pulmonary HTN  Mild - moderate AI (Pt1/2 440 ms)  PACER WIRE NOTED   TTE, 01/25/2020 MODERATE LV SYSTOLIC DYSFUNCTION (See above)  NORMAL RIGHT VENTRICULAR SYSTOLIC FUNCTION  MILD VALVULAR REGURGITATION (See above)  NO VALVULAR STENOSIS  MILD TR, PR  TRIVIAL MR  EF 35-40%  Contraction: REGIONALLY IMPAIRED  Closest EF: 40% (Estimated)  MVS: PROSTHETIC MV RING  Mitral: TRIVIAL MR  Tricuspid: MILD TR   ASSESSMENT AND PLAN:  #) CHB s/p MDT CRT-P Device functioning well, see paceart for details   #) HFrEF Most recent echo with LVEF 35% (via care everywhere) EKG with narrow QRS, does not meet criteria for ICD upgrade GDMT: bisprolol 2.5, jardiance 10, spiro 12.5 Intolerant of losartan d/t side effects   #) NSVT     {Are you ordering a CV Procedure (e.g. stress test, cath, DCCV, TEE, etc)?   Press F2        :[id number]}   Current medicines are reviewed at length with the patient today.   The patient {ACTIONS; HAS/DOES NOT HAVE:19233} concerns regarding her medicines.  The following changes were made today:  {NONE DEFAULTED:18576}  Labs/ tests ordered today include: *** No orders of the defined types were placed in this encounter.    Disposition: Follow up with {EPMDS:28135} or EP APP {EPFOLLOW UP:28173}   Signed, Sherie Don, NP  06/24/23  9:02 AM  Electrophysiology [facility]

## 2023-06-27 IMAGING — MG MM DIGITAL SCREENING BILAT W/ TOMO AND CAD
8 series · 8 of 24 positions shown · non-contrast
Comparison: Previous exam(s).

CLINICAL DATA: Screening.

EXAM:
DIGITAL SCREENING BILATERAL MAMMOGRAM WITH TOMOSYNTHESIS AND CAD
TECHNIQUE: Bilateral screening digital craniocaudal and mediolateral oblique
mammograms were obtained. Bilateral screening digital breast
tomosynthesis was performed. The images were evaluated with
computer-aided detection.

[R MLO synth-2D]
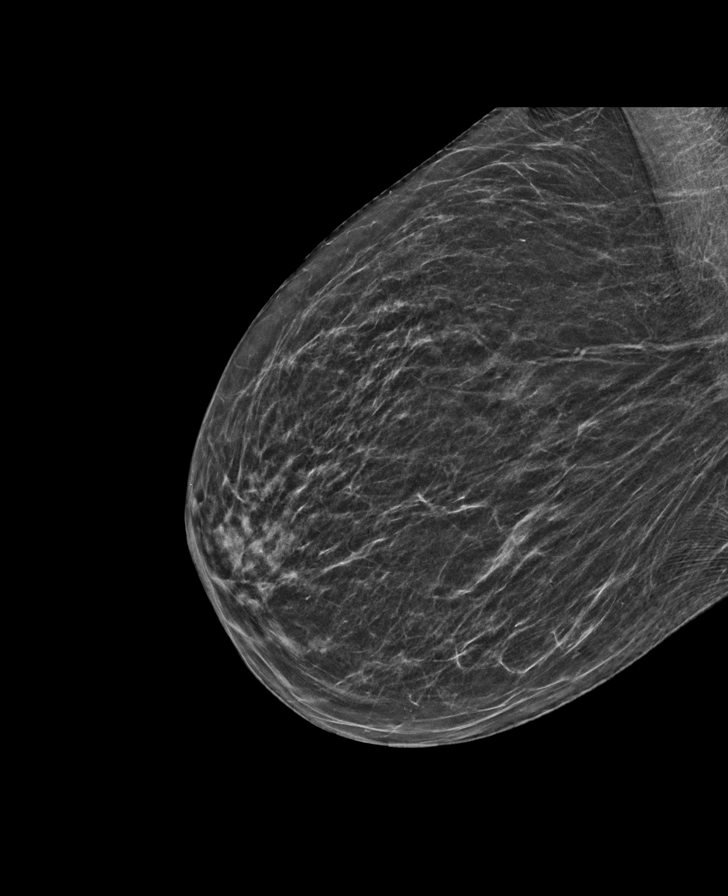

[L CC synth-2D]
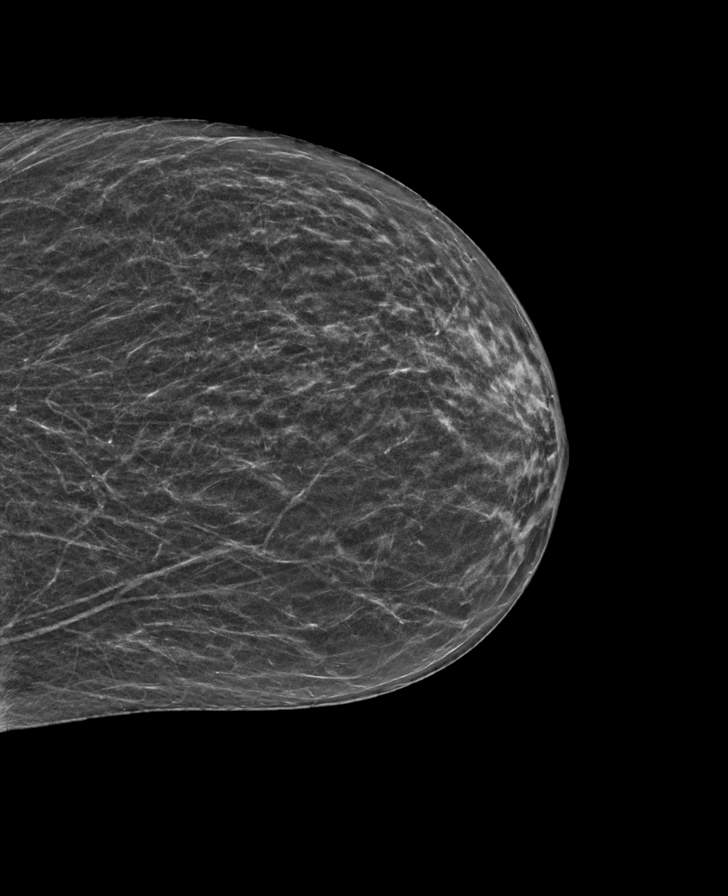

[R CC synth-2D]
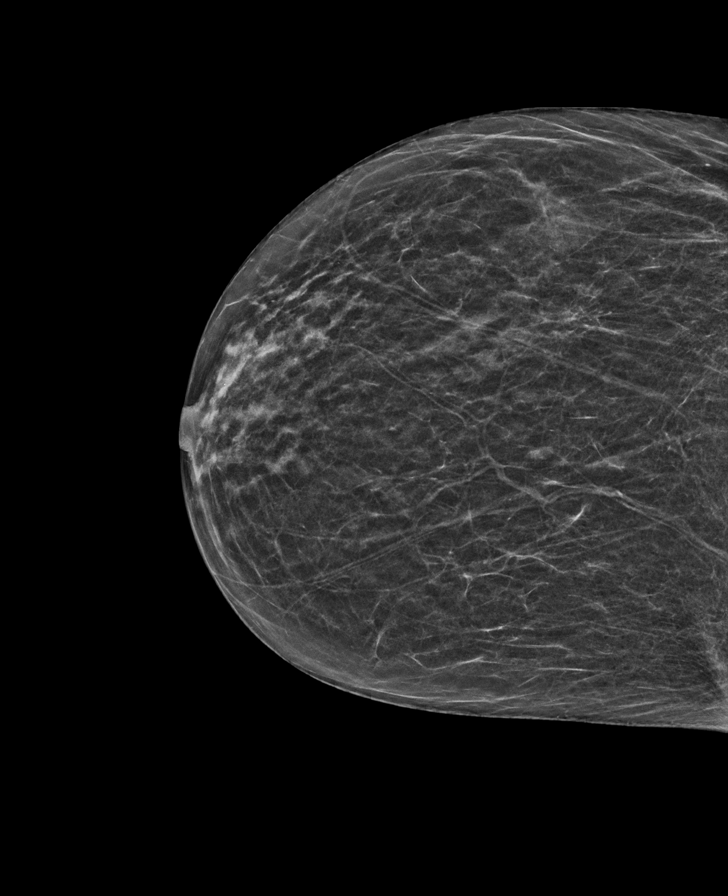

[L MLO synth-2D]
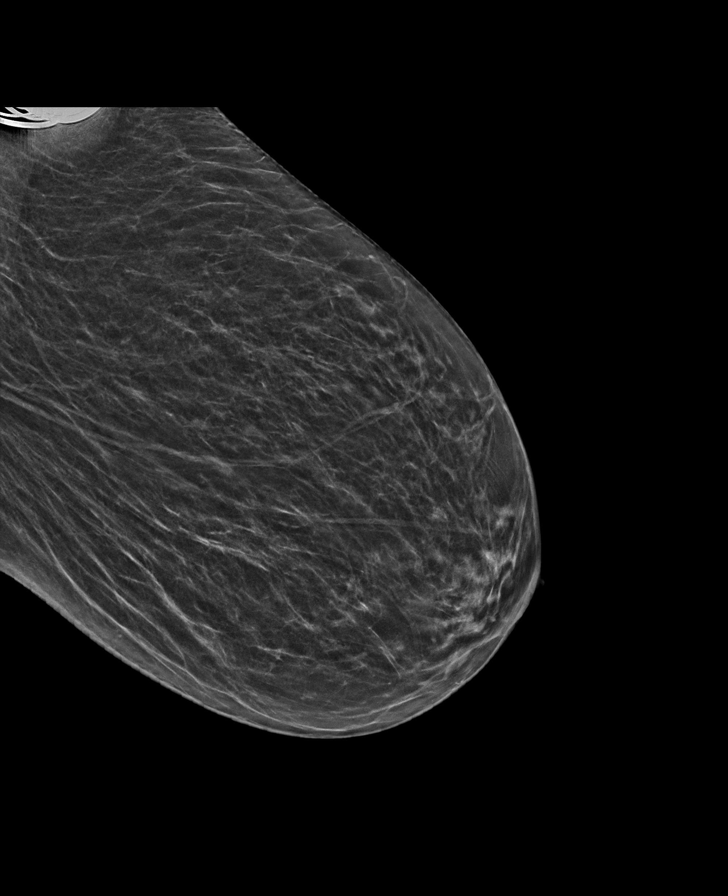

[R MLO tomo · tomo slice 27/52.0]
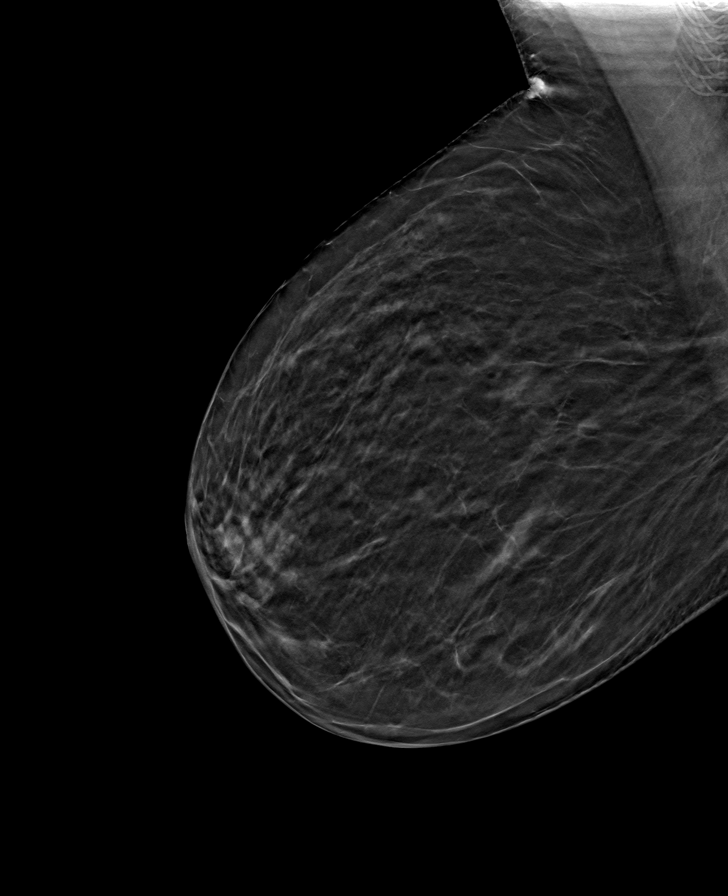

[L MLO tomo · tomo slice 27/54.0]
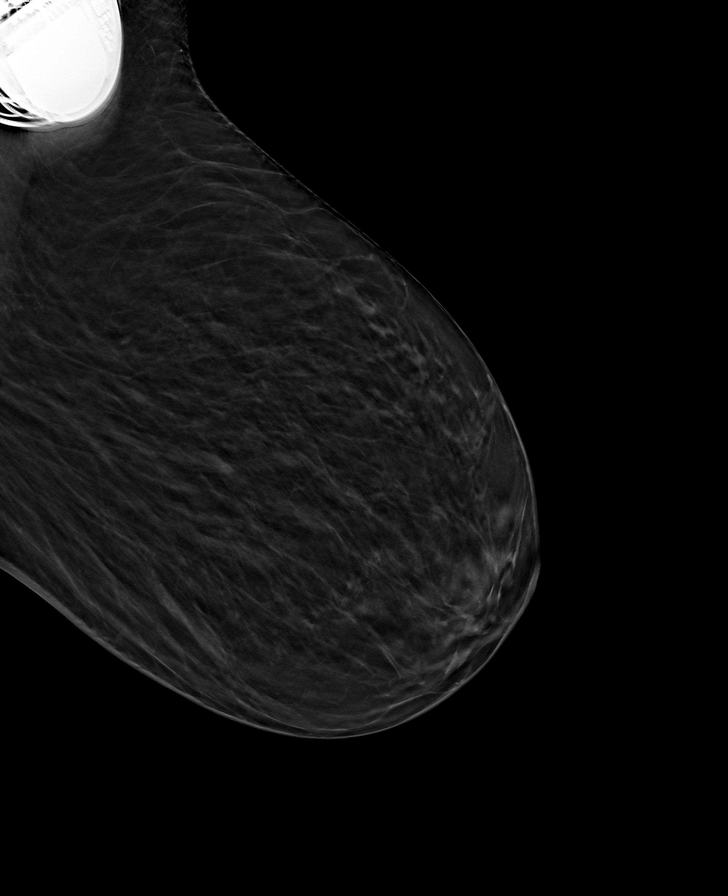

[L CC tomo · tomo slice 23/46.0]
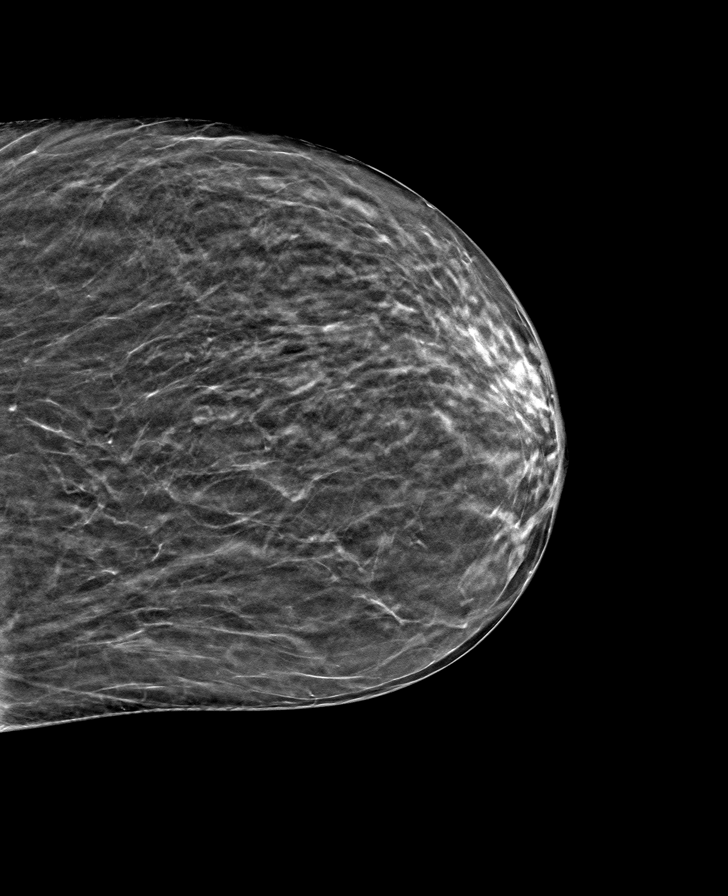

[R CC tomo · tomo slice 23/46.0]
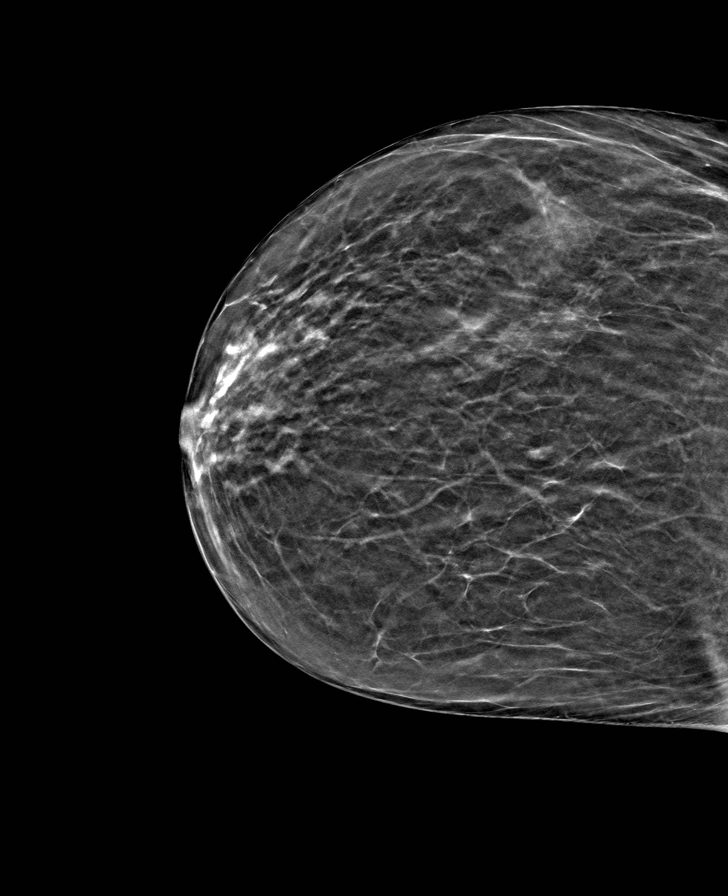

[8 of 24 positions shown; findings below may reference images not displayed]

ACR Breast Density Category b: There are scattered areas of
fibroglandular density.
FINDINGS: There are no findings suspicious for malignancy.
IMPRESSION: No mammographic evidence of malignancy. A result letter of this
screening mammogram will be mailed directly to the patient.

RECOMMENDATION:
Screening mammogram in one year. (Code:51-O-LD2)

BI-RADS CATEGORY  1: Negative.

## 2023-06-29 DIAGNOSIS — I5022 Chronic systolic (congestive) heart failure: Secondary | ICD-10-CM | POA: Diagnosis not present

## 2023-06-30 DIAGNOSIS — R600 Localized edema: Secondary | ICD-10-CM | POA: Diagnosis not present

## 2023-07-03 DIAGNOSIS — N390 Urinary tract infection, site not specified: Secondary | ICD-10-CM | POA: Diagnosis not present

## 2023-07-08 DIAGNOSIS — I5022 Chronic systolic (congestive) heart failure: Secondary | ICD-10-CM | POA: Diagnosis not present

## 2023-07-08 DIAGNOSIS — Q763 Congenital scoliosis due to congenital bony malformation: Secondary | ICD-10-CM | POA: Diagnosis not present

## 2023-07-08 DIAGNOSIS — G894 Chronic pain syndrome: Secondary | ICD-10-CM | POA: Diagnosis not present

## 2023-07-08 DIAGNOSIS — M810 Age-related osteoporosis without current pathological fracture: Secondary | ICD-10-CM | POA: Diagnosis not present

## 2023-07-08 DIAGNOSIS — K5900 Constipation, unspecified: Secondary | ICD-10-CM | POA: Diagnosis not present

## 2023-07-08 DIAGNOSIS — I509 Heart failure, unspecified: Secondary | ICD-10-CM | POA: Diagnosis not present

## 2023-07-08 DIAGNOSIS — R600 Localized edema: Secondary | ICD-10-CM | POA: Diagnosis not present

## 2023-07-08 DIAGNOSIS — D519 Vitamin B12 deficiency anemia, unspecified: Secondary | ICD-10-CM | POA: Diagnosis not present

## 2023-07-08 DIAGNOSIS — E559 Vitamin D deficiency, unspecified: Secondary | ICD-10-CM | POA: Diagnosis not present

## 2023-07-15 ENCOUNTER — Encounter: Payer: Self-pay | Admitting: Internal Medicine

## 2023-07-15 ENCOUNTER — Ambulatory Visit: Payer: Medicare HMO | Attending: Internal Medicine | Admitting: Internal Medicine

## 2023-07-15 VITALS — BP 114/62 | HR 80 | Wt 151.8 lb

## 2023-07-15 DIAGNOSIS — R000 Tachycardia, unspecified: Secondary | ICD-10-CM

## 2023-07-15 DIAGNOSIS — I442 Atrioventricular block, complete: Secondary | ICD-10-CM | POA: Diagnosis not present

## 2023-07-15 MED ORDER — FUROSEMIDE 20 MG PO TABS: ORAL_TABLET | ORAL | Status: DC

## 2023-07-15 NOTE — Progress Notes (Signed)
Patient ID: Doris [name], female   DOB: April 21, 1951, 72 y.o.   MRN: [id number]      Patient Care Team: Dorothey Baseman, MD as PCP - General (Family Medicine) [facility] Salvia, MD as PCP - Electrophysiology (Cardiology) Marcina Millard, MD as PCP - Cardiology (Cardiology)   HPI  Doris [name] is a 72 y.o. 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  Switched off because of side effects.   Started on beta-blockers for blood pressure and tachycardia.  8/20 had started her on spironolactone; it was stopped 2/21 for reasons I cannot easily discern.    The patient denies chest pain, shortness of breath, nocturnal dyspnea, orthopnea or peripheral edema.  There have been no palpitations, lightheadedness or syncope.    Has recently moved to [facility] because of an inabiltiy to reliably take care of herself Since then has noted more swelling LE  food is salty--I have increased her spironolactone without benefit.   DATE TEST EF   6/19 Echo   50 % MR Mod-Severe  9/19 Echo   40 % MR none  6/20 Echo  35-40%   5/21 Echo ([facility]) 40%   6/23 Echo ([facility]) 35%      Date Cr K Hgb  8/20 0.73 4.3 14.4   01/22 0.63 4.1   3/23 0.8 4.6 13.2   7/24 0.71 3.7 13.2      Past Medical History:  Diagnosis Date   A-fib (HCC)    Cancer (HCC)    Basal cell ( bridge of her nose)    CHF (congestive heart failure) (HCC)    Headache    Migraines   Hx of migraine headaches    Hypertension    Medical history non-contributory    Mitral valve prolapse at 19 yrs of age   Scoliosis     Past Surgical History:  Procedure Laterality Date   CARDIAC CATHETERIZATION     CATARACT EXTRACTION  02/2017   COLONOSCOPY WITH PROPOFOL N/A 03/03/2017   Procedure: COLONOSCOPY WITH PROPOFOL;  Surgeon: Charolett Bumpers, MD;  Location: [facility];  Service: Endoscopy;  Laterality: N/A;   COLONOSCOPY WITH PROPOFOL N/A 02/18/2022   Procedure: COLONOSCOPY WITH PROPOFOL;  Surgeon: Regis Bill, MD;  Location: [facility] ENDOSCOPY;  Service: Endoscopy;  Laterality: N/A;   OOPHORECTOMY  2005    Current Meds  Medication Sig   alendronate (FOSAMAX) 70 MG tablet Take 70 mg by mouth once a week.   b complex vitamins tablet Take 0.5 tablets by mouth daily.   bisoprolol (ZEBETA) 5 MG tablet TAKE 1/2 TABLET BY MOUTH DAILY   cholecalciferol (VITAMIN D3) 25 MCG (1000 UNIT) tablet Take 1,000 Units by mouth daily.   Cholecalciferol (VITAMIN D3) LIQD Take 1 drop by mouth daily.    donepezil (ARICEPT) 10 MG tablet Take 10 mg by mouth daily.   empagliflozin (JARDIANCE) 10 MG TABS tablet Take 1 tablet (10 mg total) by mouth daily before breakfast.   gabapentin (NEURONTIN) 300 MG capsule Take 300 mg by mouth 3 (three) times daily.   Krill Oil 350 MG CAPS Take 350 mg by mouth.   Menaquinone-7 (VITAMIN K2 PO) Take by mouth.   Multiple Vitamins-Minerals (ICAPS) CAPS Take by mouth daily.    omega-3 acid ethyl esters (LOVAZA) 1 g capsule Take by mouth daily.   Polyethyl Glycol-Propyl Glycol (  SYSTANE ULTRA OP) Place 1 drop into the right eye every 2 (two) hours as needed (dry eyes).   spironolactone (ALDACTONE) 25 MG tablet TAKE 1/2 TABLET BY MOUTH DAILY (Patient taking differently: Take 25 mg by mouth daily.)    Allergies  Allergen Reactions   Benzocaine Other (See Comments)    Peripheral Neuropathy   Carboxymethylcellulose Swelling   Clarithromycin Other (See Comments)    PERIPHERAL NEUROPATHY   Doxycycline Other (See Comments)    Stomach upset and Neuropathy   Fentanyl Other (See Comments)   Lidocaine Other (See Comments)    UNABLE TO SPEAK, Dr thinks he hit a blood vessal   Metronidazole Other (See Comments)    PERIPHERAL NEUROPATHY   Monosodium Glutamate    Penicillins Other (See Comments)    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- she was 73 years old Has patient had a PCN reaction causing immediate rash, facial/tongue/throat swelling, SOB or lightheadedness with hypotension: Unknown Has patient had a PCN reaction causing severe rash involving mucus membranes or skin necrosis: Unknown Has patient had a PCN reaction that required hospitalization: Yes Has patient had a PCN reaction occurring within the last 10 years: No If all of the above answers are "NO", then may proceed with Cephalos      Review of Systems negative except from HPI and PMH  Physical Exam BP 114/62 (BP Location: Left Arm, Patient Position: Sitting, Cuff Size: Normal)   Pulse 80   Wt 151 lb 12.8 oz (68.9 kg)   SpO2 98%   BMI 26.89 kg/m  Well developed and well nourished in no acute distress HENT normal Neck supple with JVP-flat Clear Device pocket well healed; without hematoma or erythema.  There is no tethering  Regular rate and rhythm, no  gallop No  murmur Abd-soft with active BS No Clubbing cyanosis 2+ edema Skin-warm and dry A & Oriented  Grossly normal sensory and motor function  ECG sinus at 80 Interval 16/07/38 PVCs with competitive atrial pacing  Device function is normal. Programming changes none  See Paceart for details     Assessment and  Plan  Sinus tachycardia  Can not exclude atrial  Mitral valve repair  Complete heart block-transient  VT- nonsustained   Junctional tachycardia  CRT-P-Medtronic LV lead off  HFmrEF  Cardio myopathy-mod--nonischemic   Alopecia  ACE/ARB intolerance  Volume overloaded.  We will begin her on a diuretic.  Lasix 20 mg daily x 3 days then q. OD.  Have encouraged [facility] to feed her a low-salt diet and suggested to her sister she could use Nu salt or Mrs. Dash to supplement flavor.  She will need follow-up regarding her edema MADIT with the PA or Dr. AP at [facility]

## 2023-07-15 NOTE — Patient Instructions (Signed)
Medication Instructions:  START Lasix 20 mg (take daily for 3 days, then every other day)  *If you need a refill on your cardiac medications before your next appointment, please call your pharmacy*   Follow-Up: At [facility], you and your health needs are our priority.  As part of our continuing mission to provide you with exceptional heart care, we have created designated Provider Care Teams.  These Care Teams include your primary Cardiologist (physician) and Advanced Practice Providers (APPs -  Physician Assistants and Nurse Practitioners) who all work together to provide you with the care you need, when you need it.  We recommend signing up for the patient portal called "MyChart".  Sign up information is provided on this After Visit Summary.  MyChart is used to connect with patients for Virtual Visits (Telemedicine).  Patients are able to view lab/test results, encounter notes, upcoming appointments, etc.  Non-urgent messages can be sent to your provider as well.   To learn more about what you can do with MyChart, go to [email or web address].    Your next appointment:   10 month(s)  Provider:   Sherryl Manges, MD

## 2023-07-16 DIAGNOSIS — I509 Heart failure, unspecified: Secondary | ICD-10-CM | POA: Diagnosis not present

## 2023-07-16 DIAGNOSIS — I5022 Chronic systolic (congestive) heart failure: Secondary | ICD-10-CM | POA: Diagnosis not present

## 2023-07-16 DIAGNOSIS — R600 Localized edema: Secondary | ICD-10-CM | POA: Diagnosis not present

## 2023-07-16 DIAGNOSIS — M810 Age-related osteoporosis without current pathological fracture: Secondary | ICD-10-CM | POA: Diagnosis not present

## 2023-07-16 DIAGNOSIS — E559 Vitamin D deficiency, unspecified: Secondary | ICD-10-CM | POA: Diagnosis not present

## 2023-07-17 DIAGNOSIS — D519 Vitamin B12 deficiency anemia, unspecified: Secondary | ICD-10-CM | POA: Diagnosis not present

## 2023-07-17 DIAGNOSIS — G894 Chronic pain syndrome: Secondary | ICD-10-CM | POA: Diagnosis not present

## 2023-08-04 ENCOUNTER — Ambulatory Visit (INDEPENDENT_AMBULATORY_CARE_PROVIDER_SITE_OTHER): Payer: Medicare HMO

## 2023-08-04 DIAGNOSIS — I442 Atrioventricular block, complete: Secondary | ICD-10-CM | POA: Diagnosis not present

## 2023-08-04 DIAGNOSIS — I429 Cardiomyopathy, unspecified: Secondary | ICD-10-CM

## 2023-08-06 LAB — CUP PACEART REMOTE DEVICE CHECK
Battery Remaining Longevity: 113 mo
Battery Voltage: 3 V
Brady Statistic AP VP Percent: 3.77 %
Brady Statistic AP VS Percent: 13.49 %
Brady Statistic AS VP Percent: 1.02 %
Brady Statistic AS VS Percent: 81.72 %
Brady Statistic RA Percent Paced: 15.81 %
Brady Statistic RV Percent Paced: 4.79 %
Date Time Interrogation Session: 20241112002409
Implantable Lead Connection Status: 753985
Implantable Lead Connection Status: 753985
Implantable Lead Connection Status: 753985
Implantable Lead Implant Date: 20190805
Implantable Lead Implant Date: 20190805
Implantable Lead Implant Date: 20190805
Implantable Lead Location: 753858
Implantable Lead Location: 753859
Implantable Lead Location: 753860
Implantable Lead Model: 4398
Implantable Lead Model: 5076
Implantable Lead Model: 5076
Implantable Lead Serial Number: 11111
Implantable Pulse Generator Implant Date: 20190805
Lead Channel Impedance Value: 1026 Ohm
Lead Channel Impedance Value: 1026 Ohm
Lead Channel Impedance Value: 304 Ohm
Lead Channel Impedance Value: 399 Ohm
Lead Channel Impedance Value: 399 Ohm
Lead Channel Impedance Value: 399 Ohm
Lead Channel Impedance Value: 475 Ohm
Lead Channel Impedance Value: 570 Ohm
Lead Channel Impedance Value: 608 Ohm
Lead Channel Impedance Value: 608 Ohm
Lead Channel Impedance Value: 798 Ohm
Lead Channel Impedance Value: 817 Ohm
Lead Channel Impedance Value: 874 Ohm
Lead Channel Impedance Value: 874 Ohm
Lead Channel Pacing Threshold Amplitude: 0.625 V
Lead Channel Pacing Threshold Amplitude: 0.75 V
Lead Channel Pacing Threshold Amplitude: 1.125 V
Lead Channel Pacing Threshold Pulse Width: 0.4 ms
Lead Channel Pacing Threshold Pulse Width: 0.4 ms
Lead Channel Pacing Threshold Pulse Width: 0.4 ms
Lead Channel Sensing Intrinsic Amplitude: 1.5 mV
Lead Channel Sensing Intrinsic Amplitude: 1.5 mV
Lead Channel Sensing Intrinsic Amplitude: 11.375 mV
Lead Channel Sensing Intrinsic Amplitude: 11.375 mV
Lead Channel Setting Pacing Amplitude: 1.5 V
Lead Channel Setting Pacing Amplitude: 2 V
Lead Channel Setting Pacing Pulse Width: 0.4 ms
Lead Channel Setting Sensing Sensitivity: 2 mV
Zone Setting Status: 755011
Zone Setting Status: 755011

## 2023-08-10 DIAGNOSIS — I5022 Chronic systolic (congestive) heart failure: Secondary | ICD-10-CM | POA: Diagnosis not present

## 2023-08-10 DIAGNOSIS — F0280 Dementia in other diseases classified elsewhere without behavioral disturbance: Secondary | ICD-10-CM | POA: Diagnosis not present

## 2023-08-10 DIAGNOSIS — R54 Age-related physical debility: Secondary | ICD-10-CM | POA: Diagnosis not present

## 2023-08-10 DIAGNOSIS — R600 Localized edema: Secondary | ICD-10-CM | POA: Diagnosis not present

## 2023-08-10 DIAGNOSIS — Q763 Congenital scoliosis due to congenital bony malformation: Secondary | ICD-10-CM | POA: Diagnosis not present

## 2023-08-10 DIAGNOSIS — M629 Disorder of muscle, unspecified: Secondary | ICD-10-CM | POA: Diagnosis not present

## 2023-08-10 DIAGNOSIS — E785 Hyperlipidemia, unspecified: Secondary | ICD-10-CM | POA: Diagnosis not present

## 2023-08-10 DIAGNOSIS — K5900 Constipation, unspecified: Secondary | ICD-10-CM | POA: Diagnosis not present

## 2023-08-10 DIAGNOSIS — G894 Chronic pain syndrome: Secondary | ICD-10-CM | POA: Diagnosis not present

## 2023-08-10 DIAGNOSIS — M810 Age-related osteoporosis without current pathological fracture: Secondary | ICD-10-CM | POA: Diagnosis not present

## 2023-08-10 DIAGNOSIS — E559 Vitamin D deficiency, unspecified: Secondary | ICD-10-CM | POA: Diagnosis not present

## 2023-08-10 DIAGNOSIS — D519 Vitamin B12 deficiency anemia, unspecified: Secondary | ICD-10-CM | POA: Diagnosis not present

## 2023-08-10 DIAGNOSIS — I509 Heart failure, unspecified: Secondary | ICD-10-CM | POA: Diagnosis not present

## 2023-08-11 DIAGNOSIS — I509 Heart failure, unspecified: Secondary | ICD-10-CM | POA: Diagnosis not present

## 2023-08-11 DIAGNOSIS — G894 Chronic pain syndrome: Secondary | ICD-10-CM | POA: Diagnosis not present

## 2023-08-12 DIAGNOSIS — M7918 Myalgia, other site: Secondary | ICD-10-CM | POA: Diagnosis not present

## 2023-08-12 DIAGNOSIS — G588 Other specified mononeuropathies: Secondary | ICD-10-CM | POA: Diagnosis not present

## 2023-08-13 DIAGNOSIS — F0280 Dementia in other diseases classified elsewhere without behavioral disturbance: Secondary | ICD-10-CM | POA: Diagnosis not present

## 2023-08-13 DIAGNOSIS — I509 Heart failure, unspecified: Secondary | ICD-10-CM | POA: Diagnosis not present

## 2023-08-13 DIAGNOSIS — M629 Disorder of muscle, unspecified: Secondary | ICD-10-CM | POA: Diagnosis not present

## 2023-08-13 DIAGNOSIS — R54 Age-related physical debility: Secondary | ICD-10-CM | POA: Diagnosis not present

## 2023-08-19 DIAGNOSIS — R54 Age-related physical debility: Secondary | ICD-10-CM | POA: Diagnosis not present

## 2023-08-19 DIAGNOSIS — I509 Heart failure, unspecified: Secondary | ICD-10-CM | POA: Diagnosis not present

## 2023-08-19 DIAGNOSIS — M629 Disorder of muscle, unspecified: Secondary | ICD-10-CM | POA: Diagnosis not present

## 2023-08-19 DIAGNOSIS — F0280 Dementia in other diseases classified elsewhere without behavioral disturbance: Secondary | ICD-10-CM | POA: Diagnosis not present

## 2023-08-22 DIAGNOSIS — R54 Age-related physical debility: Secondary | ICD-10-CM | POA: Diagnosis not present

## 2023-08-22 DIAGNOSIS — F0280 Dementia in other diseases classified elsewhere without behavioral disturbance: Secondary | ICD-10-CM | POA: Diagnosis not present

## 2023-08-22 DIAGNOSIS — I509 Heart failure, unspecified: Secondary | ICD-10-CM | POA: Diagnosis not present

## 2023-08-22 DIAGNOSIS — M629 Disorder of muscle, unspecified: Secondary | ICD-10-CM | POA: Diagnosis not present

## 2023-08-27 DIAGNOSIS — M629 Disorder of muscle, unspecified: Secondary | ICD-10-CM | POA: Diagnosis not present

## 2023-08-27 DIAGNOSIS — R54 Age-related physical debility: Secondary | ICD-10-CM | POA: Diagnosis not present

## 2023-08-27 DIAGNOSIS — F0280 Dementia in other diseases classified elsewhere without behavioral disturbance: Secondary | ICD-10-CM | POA: Diagnosis not present

## 2023-08-27 DIAGNOSIS — I509 Heart failure, unspecified: Secondary | ICD-10-CM | POA: Diagnosis not present

## 2023-08-28 DIAGNOSIS — I509 Heart failure, unspecified: Secondary | ICD-10-CM | POA: Diagnosis not present

## 2023-08-28 DIAGNOSIS — R54 Age-related physical debility: Secondary | ICD-10-CM | POA: Diagnosis not present

## 2023-08-28 DIAGNOSIS — F0280 Dementia in other diseases classified elsewhere without behavioral disturbance: Secondary | ICD-10-CM | POA: Diagnosis not present

## 2023-08-28 DIAGNOSIS — M629 Disorder of muscle, unspecified: Secondary | ICD-10-CM | POA: Diagnosis not present

## 2023-09-01 DIAGNOSIS — I509 Heart failure, unspecified: Secondary | ICD-10-CM | POA: Diagnosis not present

## 2023-09-01 DIAGNOSIS — F0280 Dementia in other diseases classified elsewhere without behavioral disturbance: Secondary | ICD-10-CM | POA: Diagnosis not present

## 2023-09-01 DIAGNOSIS — M629 Disorder of muscle, unspecified: Secondary | ICD-10-CM | POA: Diagnosis not present

## 2023-09-01 DIAGNOSIS — R54 Age-related physical debility: Secondary | ICD-10-CM | POA: Diagnosis not present

## 2023-09-01 NOTE — Progress Notes (Signed)
Remote pacemaker transmission.   

## 2023-09-02 DIAGNOSIS — D519 Vitamin B12 deficiency anemia, unspecified: Secondary | ICD-10-CM | POA: Diagnosis not present

## 2023-09-02 DIAGNOSIS — E559 Vitamin D deficiency, unspecified: Secondary | ICD-10-CM | POA: Diagnosis not present

## 2023-09-02 DIAGNOSIS — G894 Chronic pain syndrome: Secondary | ICD-10-CM | POA: Diagnosis not present

## 2023-09-02 DIAGNOSIS — Q763 Congenital scoliosis due to congenital bony malformation: Secondary | ICD-10-CM | POA: Diagnosis not present

## 2023-09-02 DIAGNOSIS — N760 Acute vaginitis: Secondary | ICD-10-CM | POA: Diagnosis not present

## 2023-09-02 DIAGNOSIS — K5900 Constipation, unspecified: Secondary | ICD-10-CM | POA: Diagnosis not present

## 2023-09-02 DIAGNOSIS — E785 Hyperlipidemia, unspecified: Secondary | ICD-10-CM | POA: Diagnosis not present

## 2023-09-02 DIAGNOSIS — I5022 Chronic systolic (congestive) heart failure: Secondary | ICD-10-CM | POA: Diagnosis not present

## 2023-09-02 DIAGNOSIS — R600 Localized edema: Secondary | ICD-10-CM | POA: Diagnosis not present

## 2023-09-02 DIAGNOSIS — M810 Age-related osteoporosis without current pathological fracture: Secondary | ICD-10-CM | POA: Diagnosis not present

## 2023-09-04 DIAGNOSIS — E785 Hyperlipidemia, unspecified: Secondary | ICD-10-CM | POA: Diagnosis not present

## 2023-09-04 DIAGNOSIS — Q763 Congenital scoliosis due to congenital bony malformation: Secondary | ICD-10-CM | POA: Diagnosis not present

## 2023-09-04 DIAGNOSIS — I509 Heart failure, unspecified: Secondary | ICD-10-CM | POA: Diagnosis not present

## 2023-09-04 DIAGNOSIS — I5022 Chronic systolic (congestive) heart failure: Secondary | ICD-10-CM | POA: Diagnosis not present

## 2023-09-04 DIAGNOSIS — E559 Vitamin D deficiency, unspecified: Secondary | ICD-10-CM | POA: Diagnosis not present

## 2023-09-04 DIAGNOSIS — M810 Age-related osteoporosis without current pathological fracture: Secondary | ICD-10-CM | POA: Diagnosis not present

## 2023-09-04 DIAGNOSIS — K5900 Constipation, unspecified: Secondary | ICD-10-CM | POA: Diagnosis not present

## 2023-09-04 DIAGNOSIS — G894 Chronic pain syndrome: Secondary | ICD-10-CM | POA: Diagnosis not present

## 2023-09-06 DIAGNOSIS — M629 Disorder of muscle, unspecified: Secondary | ICD-10-CM | POA: Diagnosis not present

## 2023-09-06 DIAGNOSIS — I509 Heart failure, unspecified: Secondary | ICD-10-CM | POA: Diagnosis not present

## 2023-09-06 DIAGNOSIS — R54 Age-related physical debility: Secondary | ICD-10-CM | POA: Diagnosis not present

## 2023-09-06 DIAGNOSIS — F0280 Dementia in other diseases classified elsewhere without behavioral disturbance: Secondary | ICD-10-CM | POA: Diagnosis not present

## 2023-09-07 DIAGNOSIS — F0280 Dementia in other diseases classified elsewhere without behavioral disturbance: Secondary | ICD-10-CM | POA: Diagnosis not present

## 2023-09-07 DIAGNOSIS — M629 Disorder of muscle, unspecified: Secondary | ICD-10-CM | POA: Diagnosis not present

## 2023-09-07 DIAGNOSIS — R54 Age-related physical debility: Secondary | ICD-10-CM | POA: Diagnosis not present

## 2023-09-07 DIAGNOSIS — I509 Heart failure, unspecified: Secondary | ICD-10-CM | POA: Diagnosis not present

## 2023-09-10 DIAGNOSIS — I509 Heart failure, unspecified: Secondary | ICD-10-CM | POA: Diagnosis not present

## 2023-09-10 DIAGNOSIS — M629 Disorder of muscle, unspecified: Secondary | ICD-10-CM | POA: Diagnosis not present

## 2023-09-10 DIAGNOSIS — F0280 Dementia in other diseases classified elsewhere without behavioral disturbance: Secondary | ICD-10-CM | POA: Diagnosis not present

## 2023-09-10 DIAGNOSIS — R54 Age-related physical debility: Secondary | ICD-10-CM | POA: Diagnosis not present

## 2023-09-19 DIAGNOSIS — R54 Age-related physical debility: Secondary | ICD-10-CM | POA: Diagnosis not present

## 2023-09-19 DIAGNOSIS — M629 Disorder of muscle, unspecified: Secondary | ICD-10-CM | POA: Diagnosis not present

## 2023-09-19 DIAGNOSIS — I509 Heart failure, unspecified: Secondary | ICD-10-CM | POA: Diagnosis not present

## 2023-09-19 DIAGNOSIS — F0280 Dementia in other diseases classified elsewhere without behavioral disturbance: Secondary | ICD-10-CM | POA: Diagnosis not present

## 2023-09-20 DIAGNOSIS — F0280 Dementia in other diseases classified elsewhere without behavioral disturbance: Secondary | ICD-10-CM | POA: Diagnosis not present

## 2023-09-20 DIAGNOSIS — I509 Heart failure, unspecified: Secondary | ICD-10-CM | POA: Diagnosis not present

## 2023-09-20 DIAGNOSIS — M629 Disorder of muscle, unspecified: Secondary | ICD-10-CM | POA: Diagnosis not present

## 2023-09-20 DIAGNOSIS — R54 Age-related physical debility: Secondary | ICD-10-CM | POA: Diagnosis not present

## 2023-09-21 DIAGNOSIS — F0280 Dementia in other diseases classified elsewhere without behavioral disturbance: Secondary | ICD-10-CM | POA: Diagnosis not present

## 2023-09-21 DIAGNOSIS — I509 Heart failure, unspecified: Secondary | ICD-10-CM | POA: Diagnosis not present

## 2023-09-21 DIAGNOSIS — M629 Disorder of muscle, unspecified: Secondary | ICD-10-CM | POA: Diagnosis not present

## 2023-09-21 DIAGNOSIS — R54 Age-related physical debility: Secondary | ICD-10-CM | POA: Diagnosis not present

## 2023-09-24 DIAGNOSIS — R54 Age-related physical debility: Secondary | ICD-10-CM | POA: Diagnosis not present

## 2023-09-24 DIAGNOSIS — I509 Heart failure, unspecified: Secondary | ICD-10-CM | POA: Diagnosis not present

## 2023-09-24 DIAGNOSIS — F0280 Dementia in other diseases classified elsewhere without behavioral disturbance: Secondary | ICD-10-CM | POA: Diagnosis not present

## 2023-09-24 DIAGNOSIS — M629 Disorder of muscle, unspecified: Secondary | ICD-10-CM | POA: Diagnosis not present

## 2023-10-01 DIAGNOSIS — F0280 Dementia in other diseases classified elsewhere without behavioral disturbance: Secondary | ICD-10-CM | POA: Diagnosis not present

## 2023-10-01 DIAGNOSIS — M629 Disorder of muscle, unspecified: Secondary | ICD-10-CM | POA: Diagnosis not present

## 2023-10-01 DIAGNOSIS — I509 Heart failure, unspecified: Secondary | ICD-10-CM | POA: Diagnosis not present

## 2023-10-01 DIAGNOSIS — R54 Age-related physical debility: Secondary | ICD-10-CM | POA: Diagnosis not present

## 2023-10-07 DIAGNOSIS — Q763 Congenital scoliosis due to congenital bony malformation: Secondary | ICD-10-CM | POA: Diagnosis not present

## 2023-10-07 DIAGNOSIS — E785 Hyperlipidemia, unspecified: Secondary | ICD-10-CM | POA: Diagnosis not present

## 2023-10-07 DIAGNOSIS — I5022 Chronic systolic (congestive) heart failure: Secondary | ICD-10-CM | POA: Diagnosis not present

## 2023-10-07 DIAGNOSIS — R600 Localized edema: Secondary | ICD-10-CM | POA: Diagnosis not present

## 2023-10-07 DIAGNOSIS — E559 Vitamin D deficiency, unspecified: Secondary | ICD-10-CM | POA: Diagnosis not present

## 2023-10-07 DIAGNOSIS — K5900 Constipation, unspecified: Secondary | ICD-10-CM | POA: Diagnosis not present

## 2023-10-07 DIAGNOSIS — G894 Chronic pain syndrome: Secondary | ICD-10-CM | POA: Diagnosis not present

## 2023-10-07 DIAGNOSIS — D519 Vitamin B12 deficiency anemia, unspecified: Secondary | ICD-10-CM | POA: Diagnosis not present

## 2023-10-07 DIAGNOSIS — M810 Age-related osteoporosis without current pathological fracture: Secondary | ICD-10-CM | POA: Diagnosis not present

## 2023-10-08 DIAGNOSIS — R54 Age-related physical debility: Secondary | ICD-10-CM | POA: Diagnosis not present

## 2023-10-08 DIAGNOSIS — M629 Disorder of muscle, unspecified: Secondary | ICD-10-CM | POA: Diagnosis not present

## 2023-10-08 DIAGNOSIS — I509 Heart failure, unspecified: Secondary | ICD-10-CM | POA: Diagnosis not present

## 2023-10-08 DIAGNOSIS — F0280 Dementia in other diseases classified elsewhere without behavioral disturbance: Secondary | ICD-10-CM | POA: Diagnosis not present

## 2023-10-09 DIAGNOSIS — I509 Heart failure, unspecified: Secondary | ICD-10-CM | POA: Diagnosis not present

## 2023-10-09 DIAGNOSIS — M629 Disorder of muscle, unspecified: Secondary | ICD-10-CM | POA: Diagnosis not present

## 2023-10-09 DIAGNOSIS — F0280 Dementia in other diseases classified elsewhere without behavioral disturbance: Secondary | ICD-10-CM | POA: Diagnosis not present

## 2023-10-09 DIAGNOSIS — R54 Age-related physical debility: Secondary | ICD-10-CM | POA: Diagnosis not present

## 2023-10-15 DIAGNOSIS — I509 Heart failure, unspecified: Secondary | ICD-10-CM | POA: Diagnosis not present

## 2023-10-15 DIAGNOSIS — R54 Age-related physical debility: Secondary | ICD-10-CM | POA: Diagnosis not present

## 2023-10-15 DIAGNOSIS — M629 Disorder of muscle, unspecified: Secondary | ICD-10-CM | POA: Diagnosis not present

## 2023-10-15 DIAGNOSIS — F0280 Dementia in other diseases classified elsewhere without behavioral disturbance: Secondary | ICD-10-CM | POA: Diagnosis not present

## 2023-10-16 DIAGNOSIS — R54 Age-related physical debility: Secondary | ICD-10-CM | POA: Diagnosis not present

## 2023-10-16 DIAGNOSIS — M629 Disorder of muscle, unspecified: Secondary | ICD-10-CM | POA: Diagnosis not present

## 2023-10-16 DIAGNOSIS — F0280 Dementia in other diseases classified elsewhere without behavioral disturbance: Secondary | ICD-10-CM | POA: Diagnosis not present

## 2023-10-16 DIAGNOSIS — I509 Heart failure, unspecified: Secondary | ICD-10-CM | POA: Diagnosis not present

## 2023-10-23 DIAGNOSIS — I509 Heart failure, unspecified: Secondary | ICD-10-CM | POA: Diagnosis not present

## 2023-10-23 DIAGNOSIS — M629 Disorder of muscle, unspecified: Secondary | ICD-10-CM | POA: Diagnosis not present

## 2023-10-23 DIAGNOSIS — R54 Age-related physical debility: Secondary | ICD-10-CM | POA: Diagnosis not present

## 2023-10-23 DIAGNOSIS — F0280 Dementia in other diseases classified elsewhere without behavioral disturbance: Secondary | ICD-10-CM | POA: Diagnosis not present

## 2023-10-29 DIAGNOSIS — F0280 Dementia in other diseases classified elsewhere without behavioral disturbance: Secondary | ICD-10-CM | POA: Diagnosis not present

## 2023-10-29 DIAGNOSIS — M629 Disorder of muscle, unspecified: Secondary | ICD-10-CM | POA: Diagnosis not present

## 2023-10-29 DIAGNOSIS — R54 Age-related physical debility: Secondary | ICD-10-CM | POA: Diagnosis not present

## 2023-10-29 DIAGNOSIS — I509 Heart failure, unspecified: Secondary | ICD-10-CM | POA: Diagnosis not present

## 2023-10-30 DIAGNOSIS — I509 Heart failure, unspecified: Secondary | ICD-10-CM | POA: Diagnosis not present

## 2023-10-30 DIAGNOSIS — F0280 Dementia in other diseases classified elsewhere without behavioral disturbance: Secondary | ICD-10-CM | POA: Diagnosis not present

## 2023-10-30 DIAGNOSIS — M629 Disorder of muscle, unspecified: Secondary | ICD-10-CM | POA: Diagnosis not present

## 2023-10-30 DIAGNOSIS — R54 Age-related physical debility: Secondary | ICD-10-CM | POA: Diagnosis not present

## 2023-11-03 ENCOUNTER — Ambulatory Visit (INDEPENDENT_AMBULATORY_CARE_PROVIDER_SITE_OTHER): Payer: Medicare HMO

## 2023-11-03 DIAGNOSIS — I442 Atrioventricular block, complete: Secondary | ICD-10-CM | POA: Diagnosis not present

## 2023-11-03 DIAGNOSIS — I429 Cardiomyopathy, unspecified: Secondary | ICD-10-CM

## 2023-11-04 DIAGNOSIS — E785 Hyperlipidemia, unspecified: Secondary | ICD-10-CM | POA: Diagnosis not present

## 2023-11-04 DIAGNOSIS — G894 Chronic pain syndrome: Secondary | ICD-10-CM | POA: Diagnosis not present

## 2023-11-04 DIAGNOSIS — Q763 Congenital scoliosis due to congenital bony malformation: Secondary | ICD-10-CM | POA: Diagnosis not present

## 2023-11-04 DIAGNOSIS — E559 Vitamin D deficiency, unspecified: Secondary | ICD-10-CM | POA: Diagnosis not present

## 2023-11-04 DIAGNOSIS — I130 Hypertensive heart and chronic kidney disease with heart failure and stage 1 through stage 4 chronic kidney disease, or unspecified chronic kidney disease: Secondary | ICD-10-CM | POA: Diagnosis not present

## 2023-11-04 DIAGNOSIS — I5022 Chronic systolic (congestive) heart failure: Secondary | ICD-10-CM | POA: Diagnosis not present

## 2023-11-04 DIAGNOSIS — M810 Age-related osteoporosis without current pathological fracture: Secondary | ICD-10-CM | POA: Diagnosis not present

## 2023-11-04 DIAGNOSIS — R600 Localized edema: Secondary | ICD-10-CM | POA: Diagnosis not present

## 2023-11-04 DIAGNOSIS — D519 Vitamin B12 deficiency anemia, unspecified: Secondary | ICD-10-CM | POA: Diagnosis not present

## 2023-11-04 LAB — CUP PACEART REMOTE DEVICE CHECK
Battery Remaining Longevity: 110 mo
Battery Voltage: 2.99 V
Brady Statistic AP VP Percent: 4.03 %
Brady Statistic AP VS Percent: 32.62 %
Brady Statistic AS VP Percent: 4.38 %
Brady Statistic AS VS Percent: 58.97 %
Brady Statistic RA Percent Paced: 39.32 %
Brady Statistic RV Percent Paced: 8.41 %
Date Time Interrogation Session: 20250211002449
Implantable Lead Connection Status: 753985
Implantable Lead Connection Status: 753985
Implantable Lead Connection Status: 753985
Implantable Lead Implant Date: 20190805
Implantable Lead Implant Date: 20190805
Implantable Lead Implant Date: 20190805
Implantable Lead Location: 753858
Implantable Lead Location: 753859
Implantable Lead Location: 753860
Implantable Lead Model: 4398
Implantable Lead Model: 5076
Implantable Lead Model: 5076
Implantable Lead Serial Number: 11111
Implantable Pulse Generator Implant Date: 20190805
Lead Channel Impedance Value: 1064 Ohm
Lead Channel Impedance Value: 1083 Ohm
Lead Channel Impedance Value: 323 Ohm
Lead Channel Impedance Value: 399 Ohm
Lead Channel Impedance Value: 418 Ohm
Lead Channel Impedance Value: 437 Ohm
Lead Channel Impedance Value: 494 Ohm
Lead Channel Impedance Value: 608 Ohm
Lead Channel Impedance Value: 627 Ohm
Lead Channel Impedance Value: 646 Ohm
Lead Channel Impedance Value: 798 Ohm
Lead Channel Impedance Value: 874 Ohm
Lead Channel Impedance Value: 893 Ohm
Lead Channel Impedance Value: 893 Ohm
Lead Channel Pacing Threshold Amplitude: 0.5 V
Lead Channel Pacing Threshold Amplitude: 1 V
Lead Channel Pacing Threshold Amplitude: 1.125 V
Lead Channel Pacing Threshold Pulse Width: 0.4 ms
Lead Channel Pacing Threshold Pulse Width: 0.4 ms
Lead Channel Pacing Threshold Pulse Width: 0.4 ms
Lead Channel Sensing Intrinsic Amplitude: 1 mV
Lead Channel Sensing Intrinsic Amplitude: 1 mV
Lead Channel Sensing Intrinsic Amplitude: 12.5 mV
Lead Channel Sensing Intrinsic Amplitude: 12.5 mV
Lead Channel Setting Pacing Amplitude: 2 V
Lead Channel Setting Pacing Amplitude: 2 V
Lead Channel Setting Pacing Pulse Width: 0.4 ms
Lead Channel Setting Sensing Sensitivity: 2 mV
Zone Setting Status: 755011
Zone Setting Status: 755011

## 2023-11-05 DIAGNOSIS — F0280 Dementia in other diseases classified elsewhere without behavioral disturbance: Secondary | ICD-10-CM | POA: Diagnosis not present

## 2023-11-05 DIAGNOSIS — M629 Disorder of muscle, unspecified: Secondary | ICD-10-CM | POA: Diagnosis not present

## 2023-11-05 DIAGNOSIS — R54 Age-related physical debility: Secondary | ICD-10-CM | POA: Diagnosis not present

## 2023-11-05 DIAGNOSIS — I509 Heart failure, unspecified: Secondary | ICD-10-CM | POA: Diagnosis not present

## 2023-11-06 DIAGNOSIS — I509 Heart failure, unspecified: Secondary | ICD-10-CM | POA: Diagnosis not present

## 2023-11-06 DIAGNOSIS — F0280 Dementia in other diseases classified elsewhere without behavioral disturbance: Secondary | ICD-10-CM | POA: Diagnosis not present

## 2023-11-06 DIAGNOSIS — R54 Age-related physical debility: Secondary | ICD-10-CM | POA: Diagnosis not present

## 2023-11-06 DIAGNOSIS — M629 Disorder of muscle, unspecified: Secondary | ICD-10-CM | POA: Diagnosis not present

## 2023-11-10 DIAGNOSIS — Z1231 Encounter for screening mammogram for malignant neoplasm of breast: Secondary | ICD-10-CM | POA: Diagnosis not present

## 2023-11-12 DIAGNOSIS — N189 Chronic kidney disease, unspecified: Secondary | ICD-10-CM | POA: Diagnosis not present

## 2023-11-12 DIAGNOSIS — E785 Hyperlipidemia, unspecified: Secondary | ICD-10-CM | POA: Diagnosis not present

## 2023-11-21 DIAGNOSIS — R54 Age-related physical debility: Secondary | ICD-10-CM | POA: Diagnosis not present

## 2023-11-21 DIAGNOSIS — F0280 Dementia in other diseases classified elsewhere without behavioral disturbance: Secondary | ICD-10-CM | POA: Diagnosis not present

## 2023-11-21 DIAGNOSIS — M629 Disorder of muscle, unspecified: Secondary | ICD-10-CM | POA: Diagnosis not present

## 2023-11-21 DIAGNOSIS — I509 Heart failure, unspecified: Secondary | ICD-10-CM | POA: Diagnosis not present

## 2023-11-23 ENCOUNTER — Encounter: Payer: Self-pay | Admitting: Internal Medicine

## 2023-11-24 DIAGNOSIS — M629 Disorder of muscle, unspecified: Secondary | ICD-10-CM | POA: Diagnosis not present

## 2023-11-24 DIAGNOSIS — I509 Heart failure, unspecified: Secondary | ICD-10-CM | POA: Diagnosis not present

## 2023-11-24 DIAGNOSIS — F0280 Dementia in other diseases classified elsewhere without behavioral disturbance: Secondary | ICD-10-CM | POA: Diagnosis not present

## 2023-11-24 DIAGNOSIS — R54 Age-related physical debility: Secondary | ICD-10-CM | POA: Diagnosis not present

## 2023-11-25 DIAGNOSIS — R54 Age-related physical debility: Secondary | ICD-10-CM | POA: Diagnosis not present

## 2023-11-25 DIAGNOSIS — F0280 Dementia in other diseases classified elsewhere without behavioral disturbance: Secondary | ICD-10-CM | POA: Diagnosis not present

## 2023-11-25 DIAGNOSIS — I509 Heart failure, unspecified: Secondary | ICD-10-CM | POA: Diagnosis not present

## 2023-11-25 DIAGNOSIS — M629 Disorder of muscle, unspecified: Secondary | ICD-10-CM | POA: Diagnosis not present

## 2023-11-25 DIAGNOSIS — R4182 Altered mental status, unspecified: Secondary | ICD-10-CM | POA: Diagnosis not present

## 2023-12-02 DIAGNOSIS — G894 Chronic pain syndrome: Secondary | ICD-10-CM | POA: Diagnosis not present

## 2023-12-02 DIAGNOSIS — I5022 Chronic systolic (congestive) heart failure: Secondary | ICD-10-CM | POA: Diagnosis not present

## 2023-12-02 DIAGNOSIS — K5900 Constipation, unspecified: Secondary | ICD-10-CM | POA: Diagnosis not present

## 2023-12-02 DIAGNOSIS — E785 Hyperlipidemia, unspecified: Secondary | ICD-10-CM | POA: Diagnosis not present

## 2023-12-02 DIAGNOSIS — D519 Vitamin B12 deficiency anemia, unspecified: Secondary | ICD-10-CM | POA: Diagnosis not present

## 2023-12-02 DIAGNOSIS — E559 Vitamin D deficiency, unspecified: Secondary | ICD-10-CM | POA: Diagnosis not present

## 2023-12-02 DIAGNOSIS — N390 Urinary tract infection, site not specified: Secondary | ICD-10-CM | POA: Diagnosis not present

## 2023-12-02 DIAGNOSIS — R600 Localized edema: Secondary | ICD-10-CM | POA: Diagnosis not present

## 2023-12-02 DIAGNOSIS — Q763 Congenital scoliosis due to congenital bony malformation: Secondary | ICD-10-CM | POA: Diagnosis not present

## 2023-12-02 DIAGNOSIS — M810 Age-related osteoporosis without current pathological fracture: Secondary | ICD-10-CM | POA: Diagnosis not present

## 2023-12-02 DIAGNOSIS — I130 Hypertensive heart and chronic kidney disease with heart failure and stage 1 through stage 4 chronic kidney disease, or unspecified chronic kidney disease: Secondary | ICD-10-CM | POA: Diagnosis not present

## 2023-12-04 DIAGNOSIS — M1711 Unilateral primary osteoarthritis, right knee: Secondary | ICD-10-CM | POA: Diagnosis not present

## 2023-12-04 DIAGNOSIS — M1712 Unilateral primary osteoarthritis, left knee: Secondary | ICD-10-CM | POA: Diagnosis not present

## 2023-12-08 DIAGNOSIS — M810 Age-related osteoporosis without current pathological fracture: Secondary | ICD-10-CM | POA: Diagnosis not present

## 2023-12-08 DIAGNOSIS — I130 Hypertensive heart and chronic kidney disease with heart failure and stage 1 through stage 4 chronic kidney disease, or unspecified chronic kidney disease: Secondary | ICD-10-CM | POA: Diagnosis not present

## 2023-12-08 DIAGNOSIS — E559 Vitamin D deficiency, unspecified: Secondary | ICD-10-CM | POA: Diagnosis not present

## 2023-12-08 DIAGNOSIS — E785 Hyperlipidemia, unspecified: Secondary | ICD-10-CM | POA: Diagnosis not present

## 2023-12-10 DIAGNOSIS — R54 Age-related physical debility: Secondary | ICD-10-CM | POA: Diagnosis not present

## 2023-12-10 DIAGNOSIS — F0280 Dementia in other diseases classified elsewhere without behavioral disturbance: Secondary | ICD-10-CM | POA: Diagnosis not present

## 2023-12-10 DIAGNOSIS — M629 Disorder of muscle, unspecified: Secondary | ICD-10-CM | POA: Diagnosis not present

## 2023-12-10 DIAGNOSIS — I509 Heart failure, unspecified: Secondary | ICD-10-CM | POA: Diagnosis not present

## 2023-12-14 ENCOUNTER — Other Ambulatory Visit: Payer: Self-pay

## 2023-12-14 ENCOUNTER — Inpatient Hospital Stay (HOSPITAL_BASED_OUTPATIENT_CLINIC_OR_DEPARTMENT_OTHER)
Admission: EM | Admit: 2023-12-14 | Discharge: 2023-12-17 | DRG: 689 | Disposition: A | Discharge status: Nursing Home (Includes ICF) | Source: Skilled Nursing Facility | Attending: Internal Medicine | Admitting: Internal Medicine

## 2023-12-14 ENCOUNTER — Emergency Department (HOSPITAL_BASED_OUTPATIENT_CLINIC_OR_DEPARTMENT_OTHER)

## 2023-12-14 ENCOUNTER — Encounter (HOSPITAL_BASED_OUTPATIENT_CLINIC_OR_DEPARTMENT_OTHER): Payer: Self-pay

## 2023-12-14 DIAGNOSIS — I341 Nonrheumatic mitral (valve) prolapse: Secondary | ICD-10-CM | POA: Diagnosis present

## 2023-12-14 DIAGNOSIS — R413 Other amnesia: Secondary | ICD-10-CM | POA: Diagnosis present

## 2023-12-14 DIAGNOSIS — E871 Hypo-osmolality and hyponatremia: Secondary | ICD-10-CM

## 2023-12-14 DIAGNOSIS — I5022 Chronic systolic (congestive) heart failure: Secondary | ICD-10-CM | POA: Diagnosis present

## 2023-12-14 DIAGNOSIS — I442 Atrioventricular block, complete: Secondary | ICD-10-CM | POA: Diagnosis present

## 2023-12-14 DIAGNOSIS — Z888 Allergy status to other drugs, medicaments and biological substances status: Secondary | ICD-10-CM

## 2023-12-14 DIAGNOSIS — R410 Disorientation, unspecified: Secondary | ICD-10-CM | POA: Diagnosis not present

## 2023-12-14 DIAGNOSIS — Z880 Allergy status to penicillin: Secondary | ICD-10-CM

## 2023-12-14 DIAGNOSIS — Z85828 Personal history of other malignant neoplasm of skin: Secondary | ICD-10-CM

## 2023-12-14 DIAGNOSIS — E782 Mixed hyperlipidemia: Secondary | ICD-10-CM | POA: Diagnosis present

## 2023-12-14 DIAGNOSIS — G9341 Metabolic encephalopathy: Secondary | ICD-10-CM | POA: Diagnosis present

## 2023-12-14 DIAGNOSIS — I4891 Unspecified atrial fibrillation: Secondary | ICD-10-CM | POA: Diagnosis present

## 2023-12-14 DIAGNOSIS — G9340 Encephalopathy, unspecified: Secondary | ICD-10-CM | POA: Diagnosis present

## 2023-12-14 DIAGNOSIS — Z8639 Personal history of other endocrine, nutritional and metabolic disease: Secondary | ICD-10-CM

## 2023-12-14 DIAGNOSIS — R531 Weakness: Secondary | ICD-10-CM

## 2023-12-14 DIAGNOSIS — E861 Hypovolemia: Secondary | ICD-10-CM | POA: Diagnosis present

## 2023-12-14 DIAGNOSIS — Z7984 Long term (current) use of oral hypoglycemic drugs: Secondary | ICD-10-CM

## 2023-12-14 DIAGNOSIS — N3000 Acute cystitis without hematuria: Secondary | ICD-10-CM | POA: Diagnosis not present

## 2023-12-14 DIAGNOSIS — Z881 Allergy status to other antibiotic agents status: Secondary | ICD-10-CM

## 2023-12-14 DIAGNOSIS — Z7983 Long term (current) use of bisphosphonates: Secondary | ICD-10-CM

## 2023-12-14 DIAGNOSIS — G43909 Migraine, unspecified, not intractable, without status migrainosus: Secondary | ICD-10-CM | POA: Diagnosis present

## 2023-12-14 DIAGNOSIS — Z833 Family history of diabetes mellitus: Secondary | ICD-10-CM

## 2023-12-14 DIAGNOSIS — F03A0 Unspecified dementia, mild, without behavioral disturbance, psychotic disturbance, mood disturbance, and anxiety: Secondary | ICD-10-CM | POA: Diagnosis present

## 2023-12-14 DIAGNOSIS — Z1619 Resistance to other specified beta lactam antibiotics: Secondary | ICD-10-CM | POA: Diagnosis present

## 2023-12-14 DIAGNOSIS — I110 Hypertensive heart disease with heart failure: Secondary | ICD-10-CM | POA: Diagnosis present

## 2023-12-14 DIAGNOSIS — N390 Urinary tract infection, site not specified: Secondary | ICD-10-CM | POA: Diagnosis not present

## 2023-12-14 DIAGNOSIS — I420 Dilated cardiomyopathy: Secondary | ICD-10-CM | POA: Diagnosis present

## 2023-12-14 DIAGNOSIS — M419 Scoliosis, unspecified: Secondary | ICD-10-CM | POA: Diagnosis present

## 2023-12-14 DIAGNOSIS — I130 Hypertensive heart and chronic kidney disease with heart failure and stage 1 through stage 4 chronic kidney disease, or unspecified chronic kidney disease: Secondary | ICD-10-CM | POA: Diagnosis not present

## 2023-12-14 DIAGNOSIS — Z9889 Other specified postprocedural states: Secondary | ICD-10-CM

## 2023-12-14 DIAGNOSIS — Z87891 Personal history of nicotine dependence: Secondary | ICD-10-CM

## 2023-12-14 DIAGNOSIS — Z79899 Other long term (current) drug therapy: Secondary | ICD-10-CM

## 2023-12-14 DIAGNOSIS — Z950 Presence of cardiac pacemaker: Secondary | ICD-10-CM

## 2023-12-14 DIAGNOSIS — R262 Difficulty in walking, not elsewhere classified: Secondary | ICD-10-CM | POA: Diagnosis present

## 2023-12-14 DIAGNOSIS — F0390 Unspecified dementia without behavioral disturbance: Secondary | ICD-10-CM | POA: Diagnosis present

## 2023-12-14 LAB — URINALYSIS, ROUTINE W REFLEX MICROSCOPIC
Bilirubin Urine: NEGATIVE
Glucose, UA: NEGATIVE mg/dL
Ketones, ur: 15 mg/dL — AB
Nitrite: NEGATIVE
Protein, ur: NEGATIVE mg/dL
Specific Gravity, Urine: 1.009 (ref 1.005–1.030)
WBC, UA: >50 WBC/hpf (ref 0–5)
pH: 7 (ref 5.0–8.0)

## 2023-12-14 LAB — CBC WITH DIFFERENTIAL/PLATELET
Abs Immature Granulocytes: 0.03 10*3/uL (ref 0.00–0.07)
Basophils Absolute: 0.1 10*3/uL (ref 0.0–0.1)
Basophils Relative: 1 %
Eosinophils Absolute: 0.3 10*3/uL (ref 0.0–0.5)
Eosinophils Relative: 3 %
HCT: 37.2 % (ref 36.0–46.0)
Hemoglobin: 12.6 g/dL (ref 12.0–15.0)
Immature Granulocytes: 0 %
Lymphocytes Relative: 15 %
Lymphs Abs: 1.3 10*3/uL (ref 0.7–4.0)
MCH: 29.9 pg (ref 26.0–34.0)
MCHC: 33.9 g/dL (ref 30.0–36.0)
MCV: 88.4 fL (ref 80.0–100.0)
Monocytes Absolute: 0.9 10*3/uL (ref 0.1–1.0)
Monocytes Relative: 10 %
Neutro Abs: 6.4 10*3/uL (ref 1.7–7.7)
Neutrophils Relative %: 71 %
Platelets: 243 10*3/uL (ref 150–400)
RBC: 4.21 MIL/uL (ref 3.87–5.11)
RDW: 13.7 % (ref 11.5–15.5)
WBC: 8.9 10*3/uL (ref 4.0–10.5)
nRBC: 0 % (ref 0.0–0.2)

## 2023-12-14 LAB — COMPREHENSIVE METABOLIC PANEL
ALT: 18 U/L (ref 0–44)
AST: 22 U/L (ref 15–41)
Albumin: 4.5 g/dL (ref 3.5–5.0)
Alkaline Phosphatase: 43 U/L (ref 38–126)
Anion gap: 9 (ref 5–15)
BUN: 16 mg/dL (ref 8–23)
CO2: 22 mmol/L (ref 22–32)
Calcium: 9 mg/dL (ref 8.9–10.3)
Chloride: 96 mmol/L — ABNORMAL LOW (ref 98–111)
Creatinine, Ser: 0.88 mg/dL (ref 0.44–1.00)
GFR, Estimated: >60 mL/min (ref >60)
Glucose, Bld: 88 mg/dL (ref 70–99)
Potassium: 4.3 mmol/L (ref 3.5–5.1)
Sodium: 127 mmol/L — ABNORMAL LOW (ref 135–145)
Total Bilirubin: 0.6 mg/dL (ref 0.0–1.2)
Total Protein: 6.6 g/dL (ref 6.5–8.1)

## 2023-12-14 LAB — LIPASE, BLOOD: Lipase: 58 U/L — ABNORMAL HIGH (ref 11–51)

## 2023-12-14 MED ORDER — IOHEXOL 300 MG/ML  SOLN
100.0000 mL | Freq: Once | INTRAMUSCULAR | Status: AC | PRN
  Administered 2023-12-14: 100 mL via INTRAVENOUS

## 2023-12-14 MED ORDER — SODIUM CHLORIDE 0.9 % IV SOLN
2.0000 g | Freq: Once | INTRAVENOUS | Status: AC
  Administered 2023-12-14: 2 g via INTRAVENOUS
  Filled 2023-12-14: qty 20

## 2023-12-14 NOTE — ED Provider Notes (Signed)
 [facility] Provider Note   CSN: [id number] Arrival date & time: 12/14/23  1625     History {Add pertinent medical, surgical, social history, OB history to HPI:1} Chief Complaint  Patient presents with   Urinary Frequency    Doris [name] is a 73 y.o. female.  73 yo F with hx of mild dementia, HTN, and CKD who presents with confusion and concerns for UTI.  Patient was treated for urinary tract infection based on a UA that was obtained at her facility 2.5 weeks ago.  Over the weekend experienced a sharp decline.  Has been more weak.  Also has been getting confused.  Has been having very dark urine and episodes of incontinence.  Also was complaining of some right-sided flank pain.  No injuries.  Now is telling me that it is moved to her right lower quadrant.  Denies any fevers, nausea or vomiting, or diarrhea.  History obtained per the patient and her sister and friend who are in the room.  Per nurse Ernest Haber at her facility over the weekend and today she started to decline. Can't follow one step directions. Too weak to get on toilet and having difficulty walking. Developed incontinence. Very dark urine. Also has had some diarrhea. Was on keflex that completed on the 17th. Typically is AAOx3 with occasional confusion. Typically is totally independent. Lives at [facility]. No recent illness outbreaks. No urine culture available.        Home Medications Prior to Admission medications   Medication Sig Start Date End Date Taking? Authorizing Provider  alendronate (FOSAMAX) 70 MG tablet Take 70 mg by mouth once a week.    [provider]  b complex vitamins tablet Take 0.5 tablets by mouth daily.    [provider]  bisoprolol (ZEBETA) 5 MG tablet TAKE 1/2 TABLET BY MOUTH DAILY 04/13/23   [facility] Salvia, MD  cholecalciferol (VITAMIN D3) 25 MCG (1000 UNIT) tablet Take 1,000 Units by mouth daily.    [provider]  Cholecalciferol (VITAMIN D3) LIQD Take 1 drop by mouth daily.     [provider]  donepezil (ARICEPT) 10 MG tablet Take 10 mg by mouth daily. 07/07/23   [provider]  empagliflozin (JARDIANCE) 10 MG TABS tablet Take 1 tablet (10 mg total) by mouth daily before breakfast. 05/08/22   Duke Salvia, MD  furosemide (LASIX) 20 MG tablet Take 1 tablet daily (20 mg) for 3 days, then take every other day 07/15/23   Duke Salvia, MD  gabapentin (NEURONTIN) 300 MG capsule Take 300 mg by mouth 3 (three) times daily.    [provider]  Boris Lown Oil 350 MG CAPS Take 350 mg by mouth.    [provider]  Menaquinone-7 (VITAMIN K2 PO) Take by mouth.    [provider]  Multiple Vitamins-Minerals (ICAPS) CAPS Take by mouth daily.     [provider]  omega-3 acid ethyl esters (LOVAZA) 1 g capsule Take by mouth daily.    [provider]  Polyethyl Glycol-Propyl Glycol (SYSTANE ULTRA OP) Place 1 drop into the right eye every 2 (two) hours as needed (dry eyes).    [provider]  spironolactone (ALDACTONE) 25 MG tablet TAKE 1/2 TABLET BY MOUTH DAILY Patient taking differently: Take 25 mg by mouth daily. 12/29/22   Duke Salvia, MD      Allergies    Benzocaine, Carboxymethylcellulose, Clarithromycin, Doxycycline, Fentanyl, Lidocaine, Metronidazole, Monosodium  glutamate, and Penicillins    Review of Systems   Review of Systems  Physical Exam Updated Vital Signs BP 109/65 (BP Location: Right Arm)   Pulse 77   Temp 97.7 F (36.5 C) (Oral)   Resp 16   Ht 5\' 3"  (1.6 m)   Wt 69 kg   SpO2 100%   BMI 26.95 kg/m  Physical Exam Vitals and nursing note reviewed.  Constitutional:      General: She is not in acute distress.    Appearance: She is well-developed.     Comments: Alert and oriented to self and place but did not know the year.  HENT:     Head: Normocephalic and atraumatic.     Right Ear: External ear  normal.     Left Ear: External ear normal.     Nose: Nose normal.  Eyes:     Extraocular Movements: Extraocular movements intact.     Conjunctiva/sclera: Conjunctivae normal.     Pupils: Pupils are equal, round, and reactive to light.  Cardiovascular:     Rate and Rhythm: Normal rate and regular rhythm.     Heart sounds: No murmur heard. Pulmonary:     Effort: Pulmonary effort is normal. No respiratory distress.     Breath sounds: Normal breath sounds.  Abdominal:     General: Abdomen is flat. There is no distension.     Palpations: Abdomen is soft. There is no mass.     Tenderness: There is abdominal tenderness (Right lower quadrant). There is no right CVA tenderness, left CVA tenderness or guarding.  Musculoskeletal:     Cervical back: Normal range of motion and neck supple.     Right lower leg: No edema.     Left lower leg: No edema.  Skin:    General: Skin is warm and dry.  Neurological:     General: No focal deficit present.     Mental Status: She is alert.     Cranial Nerves: No cranial nerve deficit.     Sensory: No sensory deficit.     Motor: No weakness.  Psychiatric:        Mood and Affect: Mood normal.     ED Results / Procedures / Treatments   Labs (all labs ordered are listed, but only abnormal results are displayed) Labs Reviewed  URINE CULTURE  URINALYSIS, ROUTINE W REFLEX MICROSCOPIC    EKG None  Radiology No results found.  Procedures Procedures  {Document cardiac monitor, telemetry assessment procedure when appropriate:1}  Medications Ordered in ED Medications - No data to display  ED Course/ Medical Decision Making/ A&P Clinical Course as of 12/14/23 1824  Mon Dec 14, 2023  1823 Sodium(!): 127 Baseline of 131 [RP]    Clinical Course User Index [RP] Rondel Baton, MD   {   Click here for ABCD2, HEART and other calculatorsREFRESH Note before signing :1}                              Medical Decision Making Amount and/or  Complexity of Data Reviewed Labs: ordered. Decision-making details documented in ED Course. Radiology: ordered.  Risk Prescription drug management.   ***  {Document critical care time when appropriate:1} {Document review of labs and clinical decision tools ie heart score, Chads2Vasc2 etc:1}  {Document your independent review of radiology images, and any outside records:1} {Document your discussion with family members, caretakers, and with consultants:1} {Document social determinants of  health affecting pt's care:1} {Document your decision making why or why not admission, treatments were needed:1} Final Clinical Impression(s) / ED Diagnoses Final diagnoses:  None    Rx / DC Orders ED Discharge Orders     None

## 2023-12-14 NOTE — Progress Notes (Signed)
 Remote pacemaker transmission.

## 2023-12-14 NOTE — ED Triage Notes (Signed)
 States being treated for UTI and today having more urination with pain.  States having more confusion than usual.  Back pain

## 2023-12-14 NOTE — Addendum Note (Signed)
 Addended by: Geralyn Flash D on: 12/14/2023 02:48 PM   Modules accepted: Orders

## 2023-12-15 ENCOUNTER — Encounter (HOSPITAL_COMMUNITY): Payer: Self-pay | Admitting: Internal Medicine

## 2023-12-15 DIAGNOSIS — Z1619 Resistance to other specified beta lactam antibiotics: Secondary | ICD-10-CM | POA: Diagnosis not present

## 2023-12-15 DIAGNOSIS — N390 Urinary tract infection, site not specified: Secondary | ICD-10-CM | POA: Diagnosis not present

## 2023-12-15 DIAGNOSIS — Z888 Allergy status to other drugs, medicaments and biological substances status: Secondary | ICD-10-CM | POA: Diagnosis not present

## 2023-12-15 DIAGNOSIS — N3000 Acute cystitis without hematuria: Secondary | ICD-10-CM | POA: Diagnosis not present

## 2023-12-15 DIAGNOSIS — I341 Nonrheumatic mitral (valve) prolapse: Secondary | ICD-10-CM | POA: Diagnosis not present

## 2023-12-15 DIAGNOSIS — Z950 Presence of cardiac pacemaker: Secondary | ICD-10-CM | POA: Diagnosis not present

## 2023-12-15 DIAGNOSIS — Z833 Family history of diabetes mellitus: Secondary | ICD-10-CM | POA: Diagnosis not present

## 2023-12-15 DIAGNOSIS — G9341 Metabolic encephalopathy: Secondary | ICD-10-CM

## 2023-12-15 DIAGNOSIS — G9340 Encephalopathy, unspecified: Secondary | ICD-10-CM | POA: Diagnosis present

## 2023-12-15 DIAGNOSIS — M419 Scoliosis, unspecified: Secondary | ICD-10-CM | POA: Diagnosis not present

## 2023-12-15 DIAGNOSIS — E871 Hypo-osmolality and hyponatremia: Secondary | ICD-10-CM | POA: Diagnosis not present

## 2023-12-15 DIAGNOSIS — E782 Mixed hyperlipidemia: Secondary | ICD-10-CM | POA: Diagnosis not present

## 2023-12-15 DIAGNOSIS — Z7983 Long term (current) use of bisphosphonates: Secondary | ICD-10-CM | POA: Diagnosis not present

## 2023-12-15 DIAGNOSIS — I4891 Unspecified atrial fibrillation: Secondary | ICD-10-CM | POA: Diagnosis not present

## 2023-12-15 DIAGNOSIS — Z85828 Personal history of other malignant neoplasm of skin: Secondary | ICD-10-CM | POA: Diagnosis not present

## 2023-12-15 DIAGNOSIS — G43909 Migraine, unspecified, not intractable, without status migrainosus: Secondary | ICD-10-CM | POA: Diagnosis not present

## 2023-12-15 DIAGNOSIS — Z881 Allergy status to other antibiotic agents status: Secondary | ICD-10-CM | POA: Diagnosis not present

## 2023-12-15 DIAGNOSIS — Z87891 Personal history of nicotine dependence: Secondary | ICD-10-CM | POA: Diagnosis not present

## 2023-12-15 DIAGNOSIS — I442 Atrioventricular block, complete: Secondary | ICD-10-CM | POA: Diagnosis not present

## 2023-12-15 DIAGNOSIS — E861 Hypovolemia: Secondary | ICD-10-CM | POA: Diagnosis not present

## 2023-12-15 DIAGNOSIS — I5022 Chronic systolic (congestive) heart failure: Secondary | ICD-10-CM | POA: Diagnosis not present

## 2023-12-15 DIAGNOSIS — Z880 Allergy status to penicillin: Secondary | ICD-10-CM | POA: Diagnosis not present

## 2023-12-15 DIAGNOSIS — I420 Dilated cardiomyopathy: Secondary | ICD-10-CM | POA: Diagnosis not present

## 2023-12-15 DIAGNOSIS — I110 Hypertensive heart disease with heart failure: Secondary | ICD-10-CM | POA: Diagnosis not present

## 2023-12-15 DIAGNOSIS — Z7984 Long term (current) use of oral hypoglycemic drugs: Secondary | ICD-10-CM | POA: Diagnosis not present

## 2023-12-15 DIAGNOSIS — F03A0 Unspecified dementia, mild, without behavioral disturbance, psychotic disturbance, mood disturbance, and anxiety: Secondary | ICD-10-CM | POA: Diagnosis not present

## 2023-12-15 LAB — CBC WITH DIFFERENTIAL/PLATELET
Abs Immature Granulocytes: 0.02 10*3/uL (ref 0.00–0.07)
Basophils Absolute: 0.1 10*3/uL (ref 0.0–0.1)
Basophils Relative: 1 %
Eosinophils Absolute: 0.3 10*3/uL (ref 0.0–0.5)
Eosinophils Relative: 5 %
HCT: 42.7 % (ref 36.0–46.0)
Hemoglobin: 13.7 g/dL (ref 12.0–15.0)
Immature Granulocytes: 0 %
Lymphocytes Relative: 18 %
Lymphs Abs: 1 10*3/uL (ref 0.7–4.0)
MCH: 29.8 pg (ref 26.0–34.0)
MCHC: 32.1 g/dL (ref 30.0–36.0)
MCV: 92.8 fL (ref 80.0–100.0)
Monocytes Absolute: 0.6 10*3/uL (ref 0.1–1.0)
Monocytes Relative: 12 %
Neutro Abs: 3.4 10*3/uL (ref 1.7–7.7)
Neutrophils Relative %: 64 %
Platelets: 275 10*3/uL (ref 150–400)
RBC: 4.6 MIL/uL (ref 3.87–5.11)
RDW: 13.9 % (ref 11.5–15.5)
WBC: 5.3 10*3/uL (ref 4.0–10.5)
nRBC: 0 % (ref 0.0–0.2)

## 2023-12-15 LAB — BASIC METABOLIC PANEL
Anion gap: 8 (ref 5–15)
Anion gap: 10 (ref 5–15)
Anion gap: 6 (ref 5–15)
BUN: 11 mg/dL (ref 8–23)
BUN: 10 mg/dL (ref 8–23)
BUN: 12 mg/dL (ref 8–23)
CO2: 23 mmol/L (ref 22–32)
CO2: 22 mmol/L (ref 22–32)
CO2: 22 mmol/L (ref 22–32)
Calcium: 9.1 mg/dL (ref 8.9–10.3)
Calcium: 9 mg/dL (ref 8.9–10.3)
Calcium: 8.9 mg/dL (ref 8.9–10.3)
Chloride: 100 mmol/L (ref 98–111)
Chloride: 98 mmol/L (ref 98–111)
Chloride: 100 mmol/L (ref 98–111)
Creatinine, Ser: 0.71 mg/dL (ref 0.44–1.00)
Creatinine, Ser: 0.71 mg/dL (ref 0.44–1.00)
Creatinine, Ser: 0.69 mg/dL (ref 0.44–1.00)
GFR, Estimated: >60 mL/min (ref >60)
GFR, Estimated: >60 mL/min (ref >60)
GFR, Estimated: >60 mL/min (ref >60)
Glucose, Bld: 97 mg/dL (ref 70–99)
Glucose, Bld: 129 mg/dL — ABNORMAL HIGH (ref 70–99)
Glucose, Bld: 111 mg/dL — ABNORMAL HIGH (ref 70–99)
Potassium: 4.1 mmol/L (ref 3.5–5.1)
Potassium: 4 mmol/L (ref 3.5–5.1)
Potassium: 4.1 mmol/L (ref 3.5–5.1)
Sodium: 131 mmol/L — ABNORMAL LOW (ref 135–145)
Sodium: 130 mmol/L — ABNORMAL LOW (ref 135–145)
Sodium: 128 mmol/L — ABNORMAL LOW (ref 135–145)

## 2023-12-15 LAB — HEPATIC FUNCTION PANEL
ALT: 20 U/L (ref 0–44)
AST: 24 U/L (ref 15–41)
Albumin: 4.2 g/dL (ref 3.5–5.0)
Alkaline Phosphatase: 46 U/L (ref 38–126)
Bilirubin, Direct: <0.1 mg/dL (ref 0.0–0.2)
Total Bilirubin: 0.8 mg/dL (ref 0.0–1.2)
Total Protein: 7 g/dL (ref 6.5–8.1)

## 2023-12-15 LAB — CORTISOL: Cortisol, Plasma: 19.9 ug/dL

## 2023-12-15 LAB — SODIUM, URINE, RANDOM: Sodium, Ur: 111 mmol/L

## 2023-12-15 LAB — TSH: TSH: 1.605 u[IU]/mL (ref 0.350–4.500)

## 2023-12-15 MED ORDER — DONEPEZIL HCL 10 MG PO TABS
10.0000 mg | ORAL_TABLET | Freq: Every day | ORAL | Status: DC
  Administered 2023-12-15 – 2023-12-16 (×2): 10 mg via ORAL
  Filled 2023-12-15 (×2): qty 1

## 2023-12-15 MED ORDER — SODIUM CHLORIDE 0.9 % IV SOLN
1.0000 g | INTRAVENOUS | Status: DC
  Administered 2023-12-15: 1 g via INTRAVENOUS
  Filled 2023-12-15: qty 10

## 2023-12-15 MED ORDER — BISOPROLOL FUMARATE 5 MG PO TABS
2.5000 mg | ORAL_TABLET | Freq: Every day | ORAL | Status: DC
  Administered 2023-12-15: 2.5 mg via ORAL
  Filled 2023-12-15 (×2): qty 1

## 2023-12-15 MED ORDER — METHOCARBAMOL 500 MG PO TABS
500.0000 mg | ORAL_TABLET | Freq: Three times a day (TID) | ORAL | Status: DC | PRN
  Administered 2023-12-15 – 2023-12-17 (×3): 500 mg via ORAL
  Filled 2023-12-15 (×3): qty 1

## 2023-12-15 MED ORDER — GABAPENTIN 300 MG PO CAPS
300.0000 mg | ORAL_CAPSULE | Freq: Three times a day (TID) | ORAL | Status: DC
  Administered 2023-12-15 – 2023-12-17 (×6): 300 mg via ORAL
  Filled 2023-12-15: qty 1
  Filled 2023-12-15: qty 3
  Filled 2023-12-15 (×4): qty 1

## 2023-12-15 MED ORDER — ENOXAPARIN SODIUM 40 MG/0.4ML IJ SOSY
40.0000 mg | PREFILLED_SYRINGE | INTRAMUSCULAR | Status: DC
  Administered 2023-12-15 – 2023-12-17 (×3): 40 mg via SUBCUTANEOUS
  Filled 2023-12-15 (×3): qty 0.4

## 2023-12-15 NOTE — Plan of Care (Signed)
 Seen and rounded on this morning. Just admitted, no need for full progress note.  73 yo female with PMH dementia admitted with UTI and possible AMS worse than baseline.  Plan: - continue roecephin - follow urine culture - tele sitter due to confusion; if unable to redirect, may need sitter -See H&P for full plan otherwise  Lewie Chamber, MD [facility] 12/15/2023, 5:47 PM

## 2023-12-15 NOTE — Progress Notes (Signed)
 Hospitalist Transfer Note:    Nursing staff, Please call [facility] Admits & Consults System-Wide number on Amion [phone]) as soon as patient's arrival, so appropriate admitting provider can evaluate the pt.  Transferring facility: [facility] Requesting provider: Dr. Eloise Harman (EDP at [facility]) Reason for transfer: admission for further evaluation and management of acute metabolic encephalopathy superimposed on dementia, along with generalized weakness in the setting of urinary tract infection, with failure of outpatient oral antibiotics.   73  year old female w/ h/o dementia , who lives at assisted living facility [facility]), who presented to [facility] ED for evaluation of confusion relative to her baseline mental status, as noted by the staff at assisted living facility.  Staff also noted the patient to be weak in a general sense over the last few days, requiring assistance with ambulation, relative to her baseline ambulatory needs, which she is able to ambulate independently.  She was recently diagnosed with a urinary tract infection as an outpatient, and reportedly has been on Keflex, but is experienced progression in the above symptoms and spite of outpatient Keflex.  Medications administered prior to transfer included the following: Rocephin.   Subsequently, I accepted this patient for transfer for inpatient admission to a MedSurg bed at [facility] or [facility]  (first available) for further work-up and management of the above.      Newton Pigg, DO Hospitalist

## 2023-12-15 NOTE — Progress Notes (Signed)
Unable to complete admission questions due to patient's altered mental status.

## 2023-12-15 NOTE — Plan of Care (Signed)

## 2023-12-15 NOTE — H&P (Signed)
 History and Physical    Doris [name] ZOX:[id number] DOB: Oct 16, 1950 DOA: 12/14/2023  Patient coming from: [facility].  Chief Complaint: Increasing confusion.  HPI: Doris [name] is a 73 y.o. female with history of complete heart block status post pacemaker placement, chronic HFrEF, mitral valve repair, dementia as per the patient's sister was found to be increasingly confused and not herself for the last 2 weeks which has been progressively getting worse.  Also had complained of some abdominal discomfort last few days.  No vomiting or diarrhea.  Given the persistent symptoms patient was brought to the ER.  ED Course: In the ER labs show mildly elevated lipase LFTs and total bilirubin were normal.  CBC was unremarkable.  EKG shows paced rhythm.  UA is concerning for UTI.  CT head and CT abdomen pelvis did not show anything acute.  Labs show sodium of 127.  Patient was started on ceftriaxone for UTI admitted for further observation.  Review of Systems: As per HPI, rest all negative.   Past Medical History:  Diagnosis Date   A-fib (HCC)    Cancer (HCC)    Basal cell ( bridge of her nose)    CHF (congestive heart failure) (HCC)    Headache    Migraines   Hx of migraine headaches    Hypertension    Medical history non-contributory    Mitral valve prolapse at 19 yrs of age   Scoliosis     Past Surgical History:  Procedure Laterality Date   CARDIAC CATHETERIZATION     CATARACT EXTRACTION  02/2017   COLONOSCOPY WITH PROPOFOL N/A 03/03/2017   Procedure: COLONOSCOPY WITH PROPOFOL;  Surgeon: Charolett Bumpers, MD;  Location: [facility];  Service: Endoscopy;  Laterality: N/A;   COLONOSCOPY WITH PROPOFOL N/A 02/18/2022   Procedure: COLONOSCOPY WITH PROPOFOL;  Surgeon: Regis Bill, MD;  Location: [facility] ENDOSCOPY;  Service: Endoscopy;  Laterality: N/A;   OOPHORECTOMY  2005     reports that she quit smoking about 35 years ago. Her smoking use  included cigarettes. She has never used smokeless tobacco. She reports that she does not currently use alcohol. She reports that she does not use drugs.  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- she was 74 years old Has patient had a PCN reaction causing immediate rash, facial/tongue/throat swelling, SOB or lightheadedness with hypotension: Unknown Has patient had a PCN reaction causing severe rash involving mucus membranes or skin necrosis: Unknown Has patient had a PCN reaction that required hospitalization: Yes Has patient had a PCN reaction occurring within the last 10 years: No If all of the above answers are "NO", then may proceed with Cephalos    Family History  Problem Relation Age of Onset   Diabetes Mother    Cancer Mother    Cancer Father     Prior to Admission medications   Medication Sig Start Date End Date Taking? Authorizing Provider  alendronate (FOSAMAX)  70 MG tablet Take 70 mg by mouth once a week.    [provider]  b complex vitamins tablet Take 0.5 tablets by mouth daily.    [provider]  bisoprolol (ZEBETA) 5 MG tablet TAKE 1/2 TABLET BY MOUTH DAILY 04/13/23   Duke Salvia, MD  cholecalciferol (VITAMIN D3) 25 MCG (1000 UNIT) tablet Take 1,000 Units by mouth daily.    [provider]  Cholecalciferol (VITAMIN D3) LIQD Take 1 drop by mouth daily.     [provider]  donepezil (ARICEPT) 10 MG tablet Take 10 mg by mouth daily.  07/07/23   [provider]  empagliflozin (JARDIANCE) 10 MG TABS tablet Take 1 tablet (10 mg total) by mouth daily before breakfast. 05/08/22   Duke Salvia, MD  furosemide (LASIX) 20 MG tablet Take 1 tablet daily (20 mg) for 3 days, then take every other day 07/15/23   Duke Salvia, MD  gabapentin (NEURONTIN) 300 MG capsule Take 300 mg by mouth 3 (three) times daily.    [provider]  Boris Lown Oil 350 MG CAPS Take 350 mg by mouth.    [provider]  Menaquinone-7 (VITAMIN K2 PO) Take by mouth.    [provider]  Multiple Vitamins-Minerals (ICAPS) CAPS Take by mouth daily.     [provider]  omega-3 acid ethyl esters (LOVAZA) 1 g capsule Take by mouth daily.    [provider]  Polyethyl Glycol-Propyl Glycol (SYSTANE ULTRA OP) Place 1 drop into the right eye every 2 (two) hours as needed (dry eyes).    [provider]  spironolactone (ALDACTONE) 25 MG tablet TAKE 1/2 TABLET BY MOUTH DAILY Patient taking differently: Take 25 mg by mouth daily. 12/29/22   Duke Salvia, MD    Physical Exam: Constitutional: Moderately built and nourished. Vitals:   12/14/23 2005 12/14/23 2108 12/14/23 2250 12/15/23 0104  BP: (!) 109/57  (!) 103/46 (!) 96/59  Pulse: 69  68 67  Resp: 17  19 14   Temp:  (!) 97.5 F (36.4 C)  (!) 97.5 F (36.4 C)  TempSrc:  Axillary  Oral  SpO2: 100%  100% 98%  Weight:    67.1 kg  Height:       Eyes: Anicteric no pallor. ENMT: No discharge from the ears eyes nose or mouth. Neck: No mass felt.  No neck rigidity. Respiratory: No rhonchi or crepitations. Cardiovascular: S1-S2 heard. Abdomen: Soft nontender bowel sound present. Musculoskeletal: No edema. Skin: No rash. Neurologic: Alert awake oriented to her name.  Moving all extremities. Psychiatric: Oriented to her name.   Labs on Admission: I have personally reviewed following labs and imaging studies  CBC: Recent Labs  Lab 12/14/23 1737  WBC  8.9  NEUTROABS 6.4  HGB 12.6  HCT 37.2  MCV 88.4  PLT 243   Basic Metabolic Panel: Recent Labs  Lab 12/14/23 1737  NA 127*  K 4.3  CL 96*  CO2 22  GLUCOSE 88  BUN 16  CREATININE 0.88  CALCIUM 9.0   GFR: Estimated Creatinine Clearance: 51.6 mL/min (by C-G formula based on SCr of 0.88 mg/dL). Liver Function Tests: Recent Labs  Lab 12/14/23 1737  AST 22  ALT 18  ALKPHOS 43  BILITOT 0.6  PROT 6.6  ALBUMIN 4.5   Recent Labs  Lab 12/14/23 1737  LIPASE 58*   No results for input(s): "AMMONIA" in the last 168 hours. Coagulation Profile: No results for input(s): "INR", "PROTIME" in the  last 168 hours. Cardiac Enzymes: No results for input(s): "CKTOTAL", "CKMB", "CKMBINDEX", "TROPONINI" in the last 168 hours. BNP (last 3 results) No results for input(s): "PROBNP" in the last 8760 hours. HbA1C: No results for input(s): "HGBA1C" in the last 72 hours. CBG: No results for input(s): "GLUCAP" in the last 168 hours. Lipid Profile: No results for input(s): "CHOL", "HDL", "LDLCALC", "TRIG", "CHOLHDL", "LDLDIRECT" in the last 72 hours. Thyroid Function Tests: No results for input(s): "TSH", "T4TOTAL", "FREET4", "T3FREE", "THYROIDAB" in the last 72 hours. Anemia Panel: No results for input(s): "VITAMINB12", "FOLATE", "FERRITIN", "TIBC", "IRON", "RETICCTPCT" in the last 72 hours. Urine analysis:    Component Value Date/Time   COLORURINE YELLOW 12/14/2023 1800   APPEARANCEUR CLEAR 12/14/2023 1800   LABSPEC 1.009 12/14/2023 1800   PHURINE 7.0 12/14/2023 1800   GLUCOSEU NEGATIVE 12/14/2023 1800   HGBUR LARGE (A) 12/14/2023 1800   BILIRUBINUR NEGATIVE 12/14/2023 1800   KETONESUR 15 (A) 12/14/2023 1800   PROTEINUR NEGATIVE 12/14/2023 1800   NITRITE NEGATIVE 12/14/2023 1800   LEUKOCYTESUR MODERATE (A) 12/14/2023 1800   Sepsis Labs: @LABRCNTIP (procalcitonin:4,lacticidven:4) )No results found for this or any previous visit (from the past 240 hours).   Radiological Exams on  Admission: CT ABDOMEN PELVIS W CONTRAST Result Date: 12/14/2023 CLINICAL DATA:  RLQ abd pain States being treated for UTI and today having more urination with pain. States having more confusion than usual. Back pain EXAM: CT ABDOMEN AND PELVIS WITH CONTRAST TECHNIQUE: Multidetector CT imaging of the abdomen and pelvis was performed using the standard protocol following bolus administration of intravenous contrast. RADIATION DOSE REDUCTION: This exam was performed according to the departmental dose-optimization program which includes automated exposure control, adjustment of the mA and/or kV according to patient size and/or use of iterative reconstruction technique. CONTRAST:  OMNIPAQUE IOHEXOL 300 MG/ML  SOLN COMPARISON:  CT abdomen pelvis 12/06/2021 FINDINGS: Lower chest: Cardiac leads partially visualized. Moderate to large hiatal hernia. Hepatobiliary: Chronic innumerable simple appearing cystic lesions of the liver. No gallstones, gallbladder wall thickening, or pericholecystic fluid. No biliary dilatation. Pancreas: No focal lesion. Normal pancreatic contour. No surrounding inflammatory changes. No main pancreatic ductal dilatation. Spleen: Normal in size without focal abnormality. Adrenals/Urinary Tract: No adrenal nodule bilaterally. Bilateral kidneys enhance symmetrically. No hydronephrosis. No hydroureter. The urinary bladder is unremarkable. On delayed imaging, there is no urothelial wall thickening and there are no filling defects in the opacified portions of the bilateral collecting systems or ureters. Stomach/Bowel: Stomach is within normal limits. No evidence of bowel wall thickening or dilatation. Mobile medialized cecum. Colonic diverticulosis. Appendix appears normal. Vascular/Lymphatic: No abdominal aorta or iliac aneurysm. Mild atherosclerotic plaque. No abdominal, pelvic, or inguinal lymphadenopathy. Reproductive: Uterus and bilateral adnexa are unremarkable. Other: No intraperitoneal  free fluid. No intraperitoneal free gas. No organized fluid collection. Musculoskeletal: No abdominal wall hernia or abnormality. No suspicious lytic or blastic osseous lesions. No acute displaced fracture. Levocurvature of the thoracolumbar spine. Multilevel degenerative changes of the spine. Similar-appearing possible chondroid lesion along the anterior femoral head. IMPRESSION: 1. Moderate to large hiatal hernia. 2. Colonic diverticulosis with no acute diverticulitis. 3. Cardiomegaly. Electronically Signed   By: Tish Frederickson M.D.   On: 12/14/2023 20:52   CT Head Wo Contrast Result Date: 12/14/2023 CLINICAL DATA:  ams EXAM: CT HEAD WITHOUT CONTRAST TECHNIQUE: Contiguous axial images were obtained from the base of the skull through the vertex without intravenous contrast. RADIATION DOSE REDUCTION: This exam was performed according to the departmental dose-optimization program which includes automated exposure  control, adjustment of the mA and/or kV according to patient size and/or use of iterative reconstruction technique. COMPARISON:  None Available. FINDINGS: Brain: Cerebral ventricle sizes are concordant with the degree of cerebral volume loss. Patchy and confluent areas of decreased attenuation are noted throughout the deep and periventricular white matter of the cerebral hemispheres bilaterally, compatible with chronic microvascular ischemic disease. No evidence of large-territorial acute infarction. No parenchymal hemorrhage. No mass lesion. No extra-axial collection. No mass effect or midline shift. No hydrocephalus. Basilar cisterns are patent. Vascular: No hyperdense vessel. Atherosclerotic calcifications are present within the cavernous internal carotid arteries. Skull: No acute fracture or focal lesion. Sinuses/Orbits: Paranasal sinuses and mastoid air cells are clear. Bilateral lens replacement. Otherwise the orbits are unremarkable. Other: None. IMPRESSION: No acute intracranial abnormality.  Electronically Signed   By: Tish Frederickson M.D.   On: 12/14/2023 20:10    EKG: Independently reviewed.  Paced rhythm.  Assessment/Plan Principal Problem:   Acute metabolic encephalopathy Active Problems:   Dilated cardiomyopathy (HCC)   History of hyperlipidemia, mixed   Memory loss   S/P mitral valve repair   Hyponatremia   UTI (urinary tract infection)   Acute encephalopathy    Acute metabolic encephalopathy likely multifactorial including UTI and hyponatremia.  At the time of my exam patient is alert awake not in distress following commands and oriented to her name.  Not sure of the baseline but patient's sister is coming to see the patient in another hour or 2.  Will check ammonia and TSH and B12. UTI likely contributing to #1.  On ceftriaxone.  Follow cultures. Hyponatremia cause not clear.  Check urine studies including urine sodium and osmolarity.  Check TSH cortisol level and check serial metabolic panel.  Hold spironolactone for now given hyponatremia. Chronic HFrEF last EF measured was 30 to 35% on June 2023 as in Care Everywhere.  Continue bisoprolol.  Hold spironolactone due to hyponatremia. Dementia on Aricept. History of mitral valve repair and complete heart block status post pacemaker placement. Elevated lipase but CT scan abdomen pelvis unremarkable. Gabapentin is mentioned in patient's medication list needs to confirm if patient is taking this or not.  Given the acute hyponatremia with metabolic encephalopathy and UTI will need close monitoring further workup and more than 2 midnight stay.   DVT prophylaxis: Lovenox. Code Status: Full code confirmed with patient's sister Ms. Mariane Masters. Family Communication: Discussed with patient's sister Ms. Mariane Masters. Disposition Plan: Medical floor. Consults called: None. Admission status: Inpatient.

## 2023-12-15 NOTE — Progress Notes (Signed)
 Pt arrived to unit. Pt resting comfortably in bed, aox2. Vital signs taken, bed in lowest position, fall mat in place, bed alarm on.  Admitting provider paged & notified of patients arrival to unit.

## 2023-12-15 NOTE — TOC Initial Note (Addendum)
 Transition of Care [facility]) - Initial/Assessment Note    Patient Details  Name: Doris [name] MRN: [id number] Date of Birth: 1951-05-26  [facility] CM/SW Contact:    Jessie Foot, RN Phone Number: 12/15/2023, 3:41 PM  Clinical Narrative:       3:54 PM [facility] returned call verifying AL and has a bed.            Patient presented for UTI. PTA patient was from [employer]. DME (cane and shower chair). Patient states that PT is current with [facility]. Will need order for resumption for PT at discharge. Left message with [facility] to verified resident and level of care. Patients daughter will transport back to [facility] at discharge. No further needs identified during the visit. Case Manager will continue to follow.  Expected Discharge Plan: Assisted Living Barriers to Discharge: Continued Medical Work up   Patient Goals and CMS Choice Patient states their goals for this hospitalization and ongoing recovery are:: Return to [facility]   Choice offered to / list presented to : NA      Expected Discharge Plan and Services   Discharge Planning Services: CM Consult Post Acute Care Choice: NA Living arrangements for the past 2 months: Assisted Living Facility                 DME Arranged: N/A DME Agency: NA       HH Arranged: NA HH Agency: NA        Prior Living Arrangements/Services Living arrangements for the past 2 months: Assisted Living Facility Lives with:: Self Patient language and need for interpreter reviewed:: Yes Do you feel safe going back to the place where you live?: Yes      Need for Family Participation in Patient Care: No (Comment) Care giver support system in place?: Yes (comment) Current home services: Home PT Criminal Activity/Legal Involvement Pertinent to Current Situation/Hospitalization: No - Comment as needed  Activities of Daily Living      Permission  Sought/Granted Permission sought to share information with : Case Manager Permission granted to share information with : Yes, Verbal Permission Granted  Share Information with NAME: Mariane Masters  Permission granted to share info w AGENCY: [facility]  Permission granted to share info w Relationship: Daughter  Permission granted to share info w Contact Information: [phone]  Emotional Assessment Appearance:: Appears stated age Attitude/Demeanor/Rapport: Engaged Affect (typically observed): Accepting, Appropriate Orientation: : Oriented to Self, Oriented to  Time Alcohol / Substance Use: Not Applicable Psych Involvement: No (comment)  Admission diagnosis:  Acute metabolic encephalopathy [G93.41] Acute encephalopathy [G93.40] Patient Active Problem List   Diagnosis Date Noted   Hyponatremia 12/15/2023   UTI (urinary tract infection) 12/15/2023   Acute encephalopathy 12/15/2023   Acute metabolic encephalopathy 12/14/2023   Memory loss 03/19/2023   Osteoporosis without current pathological fracture 04/08/2021   Dilated cardiomyopathy (HCC) 09/26/2019   Painful veins 08/07/2018   Valvular heart disease 06/07/2018   Complete heart block, transient (HCC) 04/22/2018   S/P mitral valve repair 04/22/2018   CHF (congestive heart failure), NYHA class II, chronic, systolic (HCC) 04/20/2018   History of hyperlipidemia, mixed 07/10/2009   Tubular adenoma of colon 03/14/2009   PCP:  Dorothey Baseman, MD Pharmacy:   [facility] [phone] [place], [place] - [address] [place] [address] Phone: [phone] Fax: [phone]  [facility][id number] [place], [place] - [address] AT [facility] OF [address] [place] [address] Phone: [phone] Fax: [phone]  [facility] [address] [place] - [address] [place] [address] Phone: [phone] Fax: [phone]  [facility] #[id number] [place], [place] - [address] AT [facility] OF [address] & [facility] [address] [place] [address] Phone: [phone] Fax: [phone]  [place] [facility] [address] [place] [address] Phone: [phone] Fax: [phone]     Social Drivers of Health (SDOH) Social History: SDOH Screenings   Food Insecurity: Patient Declined (12/08/2022)   Received from [facility], [facility]  Transportation Needs: Patient Declined (12/08/2022)   Received from [facility], [facility]  Utilities: Not At Risk (12/08/2022)   Received from [facility], [facility]  Financial Resource Strain: Patient Declined (12/08/2022)   Received from [facility], [facility]  Physical Activity: Unknown (01/26/2018)   Received from [facility], [facility]  Tobacco Use: Medium Risk (12/15/2023)   SDOH Interventions:     Readmission Risk Interventions    12/15/2023    3:37 PM  Readmission Risk Prevention Plan  Post Dischage Appt Complete  Medication Screening Complete  Transportation Screening Complete

## 2023-12-16 DIAGNOSIS — E871 Hypo-osmolality and hyponatremia: Secondary | ICD-10-CM | POA: Diagnosis not present

## 2023-12-16 DIAGNOSIS — N390 Urinary tract infection, site not specified: Secondary | ICD-10-CM | POA: Diagnosis not present

## 2023-12-16 DIAGNOSIS — G9341 Metabolic encephalopathy: Secondary | ICD-10-CM | POA: Diagnosis not present

## 2023-12-16 LAB — CBC WITH DIFFERENTIAL/PLATELET
Abs Immature Granulocytes: 0.02 10*3/uL (ref 0.00–0.07)
Basophils Absolute: 0.1 10*3/uL (ref 0.0–0.1)
Basophils Relative: 1 %
Eosinophils Absolute: 0.5 10*3/uL (ref 0.0–0.5)
Eosinophils Relative: 9 %
HCT: 41.5 % (ref 36.0–46.0)
Hemoglobin: 13.7 g/dL (ref 12.0–15.0)
Immature Granulocytes: 0 %
Lymphocytes Relative: 30 %
Lymphs Abs: 1.6 10*3/uL (ref 0.7–4.0)
MCH: 30.3 pg (ref 26.0–34.0)
MCHC: 33 g/dL (ref 30.0–36.0)
MCV: 91.8 fL (ref 80.0–100.0)
Monocytes Absolute: 0.6 10*3/uL (ref 0.1–1.0)
Monocytes Relative: 11 %
Neutro Abs: 2.6 10*3/uL (ref 1.7–7.7)
Neutrophils Relative %: 49 %
Platelets: 249 10*3/uL (ref 150–400)
RBC: 4.52 MIL/uL (ref 3.87–5.11)
RDW: 13.8 % (ref 11.5–15.5)
WBC: 5.2 10*3/uL (ref 4.0–10.5)
nRBC: 0 % (ref 0.0–0.2)

## 2023-12-16 LAB — BASIC METABOLIC PANEL
Anion gap: 8 (ref 5–15)
BUN: 12 mg/dL (ref 8–23)
CO2: 22 mmol/L (ref 22–32)
Calcium: 9.1 mg/dL (ref 8.9–10.3)
Chloride: 101 mmol/L (ref 98–111)
Creatinine, Ser: 0.68 mg/dL (ref 0.44–1.00)
GFR, Estimated: >60 mL/min (ref >60)
Glucose, Bld: 93 mg/dL (ref 70–99)
Potassium: 3.9 mmol/L (ref 3.5–5.1)
Sodium: 131 mmol/L — ABNORMAL LOW (ref 135–145)

## 2023-12-16 LAB — URINE CULTURE: Culture: 70000 — AB

## 2023-12-16 LAB — MAGNESIUM: Magnesium: 2.3 mg/dL (ref 1.7–2.4)

## 2023-12-16 MED ORDER — POLYETHYLENE GLYCOL 3350 17 G PO PACK
17.0000 g | PACK | Freq: Every day | ORAL | Status: DC
  Administered 2023-12-16 – 2023-12-17 (×2): 17 g via ORAL
  Filled 2023-12-16 (×2): qty 1

## 2023-12-16 MED ORDER — NITROFURANTOIN MONOHYD MACRO 100 MG PO CAPS
100.0000 mg | ORAL_CAPSULE | Freq: Two times a day (BID) | ORAL | Status: DC
  Administered 2023-12-16 – 2023-12-17 (×3): 100 mg via ORAL
  Filled 2023-12-16 (×3): qty 1

## 2023-12-16 MED ORDER — SENNOSIDES-DOCUSATE SODIUM 8.6-50 MG PO TABS
1.0000 | ORAL_TABLET | Freq: Two times a day (BID) | ORAL | Status: DC
  Administered 2023-12-16 – 2023-12-17 (×3): 1 via ORAL
  Filled 2023-12-16 (×3): qty 1

## 2023-12-16 NOTE — Assessment & Plan Note (Signed)
-   Difficult to interpret altered mentation in setting of underlying dementia but treating UTI regardless - Appears to be back to baseline

## 2023-12-16 NOTE — Evaluation (Signed)
 Physical Therapy Evaluation Patient Details Name: Doris [name] MRN: [id number] DOB: [birth year] Today's Date: 12/16/2023  History of Present Illness  73 yo female admitted with encephalopathy. Hx of dementia, Afib, CHF, scoliosis. Pt is from [facility]  Clinical Impression  On eval, pt required CGA-Min A for mobility. She walked ~200 feet around the unit. Will plan to follow and progress a activity as tolerated. Recommend HHPT f/u at ALF. Recommend daily hallway ambulation with nursing and/or mobility team as able.         If plan is discharge home, recommend the following: A little help with walking and/or transfers;A little help with bathing/dressing/bathroom;Assistance with cooking/housework;Assist for transportation;Help with stairs or ramp for entrance   Can travel by private vehicle        Equipment Recommendations None recommended by PT  Recommendations for Other Services       Functional Status Assessment Patient has had a recent decline in their functional status and demonstrates the ability to make significant improvements in function in a reasonable and predictable amount of time.     Precautions / Restrictions Precautions Precautions: Fall Restrictions Weight Bearing Restrictions Per Provider Order: No      Mobility  Bed Mobility Overal bed mobility: Needs Assistance Bed Mobility: Supine to Sit     Supine to sit: Supervision, HOB elevated, Used rails     General bed mobility comments: Increased time. Cues required.    Transfers Overall transfer level: Needs assistance Equipment used: Rolling walker (2 wheels) Transfers: Sit to/from Stand Sit to Stand: Min assist           General transfer comment: Some assist to power up and steady. Cues for safety.    Ambulation/Gait Ambulation/Gait assistance: Min assist, Contact guard assist Gait Distance (Feet): 200 Feet Assistive device: Rolling walker (2 wheels), None Gait Pattern/deviations:  Step-through pattern, Decreased stride length       General Gait Details: Good gait speed. Walked ~125 feet with RW-CGA, then another ~75 feet without a device-Min A intermittently. Tolerated distance well.  Stairs            Wheelchair Mobility     Tilt Bed    Modified Rankin (Stroke Patients Only)       Balance Overall balance assessment: Needs assistance         Standing balance support: During functional activity Standing balance-Leahy Scale: Fair                               Pertinent Vitals/Pain Pain Assessment Pain Assessment: No/denies pain    Home Living Family/patient expects to be discharged to:: Assisted living                 Home Equipment: Gilmer Mor - single point      Prior Function Prior Level of Function : Needs assist             Mobility Comments: ambulatory       Extremity/Trunk Assessment   Upper Extremity Assessment Upper Extremity Assessment: Defer to OT evaluation    Lower Extremity Assessment Lower Extremity Assessment: Generalized weakness    Cervical / Trunk Assessment Cervical / Trunk Assessment: Normal  Communication   Communication Communication: No apparent difficulties    Cognition Arousal: Alert Behavior During Therapy: WFL for tasks assessed/performed   PT - Cognitive impairments: History of cognitive impairments  Following commands: Impaired Following commands impaired: Follows one step commands with increased time (and with some repetition)     Cueing Cueing Techniques: Verbal cues, Visual cues     General Comments      Exercises     Assessment/Plan    PT Assessment Patient needs continued PT services  PT Problem List Decreased strength;Decreased range of motion;Decreased activity tolerance;Decreased balance;Decreased mobility;Decreased knowledge of use of DME;Decreased cognition;Decreased safety awareness       PT Treatment  Interventions DME instruction;Gait training;Functional mobility training;Therapeutic activities;Therapeutic exercise;Patient/family education;Balance training    PT Goals (Current goals can be found in the Care Plan section)  Acute Rehab PT Goals Patient Stated Goal: none stated PT Goal Formulation: With patient Time For Goal Achievement: 12/30/23 Potential to Achieve Goals: Good    Frequency Min 3X/week     Co-evaluation               AM-PAC PT "6 Clicks" Mobility  Outcome Measure Help needed turning from your back to your side while in a flat bed without using bedrails?: A Little Help needed moving from lying on your back to sitting on the side of a flat bed without using bedrails?: A Little Help needed moving to and from a bed to a chair (including a wheelchair)?: A Little Help needed standing up from a chair using your arms (e.g., wheelchair or bedside chair)?: A Little Help needed to walk in hospital room?: A Little Help needed climbing 3-5 steps with a railing? : A Little 6 Click Score: 18    End of Session Equipment Utilized During Treatment: Gait belt Activity Tolerance: Patient tolerated treatment well Patient left: in chair;with call bell/phone within reach;with chair alarm set   PT Visit Diagnosis: Muscle weakness (generalized) (M62.81);Unsteadiness on feet (R26.81)    Time: 8295-6213 PT Time Calculation (min) (ACUTE ONLY): 24 min   Charges:   PT Evaluation $PT Eval Low Complexity: 1 Low PT Treatments $Gait Training: 8-22 mins PT General Charges $$ ACUTE PT VISIT: 1 Visit            Faye Ramsay, PT Acute Rehabilitation  Office: [phone]

## 2023-12-16 NOTE — Assessment & Plan Note (Signed)
-   Presumed hypovolemic and prerenal from poor intake prior to admission - Has improved s/p admission

## 2023-12-16 NOTE — Assessment & Plan Note (Signed)
-   History of MV repair and pacemaker placement

## 2023-12-16 NOTE — Assessment & Plan Note (Signed)
 Continue Aricept

## 2023-12-16 NOTE — Progress Notes (Signed)
 Progress Note    [name]   WUJ:[id number]  DOB: [birth year]  DOA: 12/14/2023     1 PCP: Dorothey Baseman, MD  Initial CC: AMS  Hospital Course: [name] is a 73 year old female with PMH advanced dementia, CHF, CHB s/p pacemaker placement, HTN, A-fib, BCC who presented to the hospital with reported worsening confusion from her nursing facility.  There was also some complaints of abdominal discomfort; no nausea or vomiting. She had further workup in the ER.  UA noted with negative nitrite, moderate LE, greater than 50 WBC, many bacteria.  She was started on Rocephin and admitted for further workup.  Urine culture grew Citrobacter and antibiotics were transitioned to nitrofurantion after sensitivity panel reviewed.  Interval History:  No events overnight.  Resting comfortably in bed this morning.  Urine clear yellow noted in [facility] basin.  Patient denies any lower abdominal discomfort and has been tolerating diet well.  Assessment and Plan: * Acute metabolic encephalopathy - Difficult to interpret altered mentation in setting of underlying dementia but treating UTI regardless - Appears to be back to baseline  UTI (urinary tract infection) - Patient presented from nursing facility due to concern for altered mentation and lower abdominal discomfort - UA reviewed, essentially consistent with UTI; urine culture only grew 70,000 Citrobacter but will continue treatment course; sensitivities reviewed, resistant to Rocephin, thus discontinued and transitioned to nitrofurantoin to complete 3 more days -Voiding well with no complaints  Hyponatremia - Presumed hypovolemic and prerenal from poor intake prior to admission - Has improved s/p admission  Dementia without behavioral disturbance (HCC) - Continue Aricept  Complete heart block, 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 Continue bisoprolol. Hold spironolactone due to hyponatremia.    Old records reviewed in assessment of this patient  Antimicrobials: Rocephin 12/14/2023 >> 12/15/2023 Nitrofurantoin 12/16/2023 >> current  DVT prophylaxis:  enoxaparin (LOVENOX) injection 40 mg Start: 12/15/23 1000   Code Status:   Code Status: Full Code  Mobility Assessment (Last 72 Hours)     Mobility Assessment     Row Name 12/16/23 1100 12/16/23 0900 12/15/23 1930 12/15/23 0809 12/15/23 0050   Does patient have an order for bedrest or is patient medically unstable -- No - Continue assessment No - Continue assessment No - Continue assessment No - Continue assessment   What is the highest level of mobility based on the progressive mobility assessment? Level 5 (Walks with assist in room/hall) - Balance while stepping forward/back and can walk in room with assist - Complete Level 5 (Walks with assist in room/hall) - Balance while stepping forward/back and can walk in room with assist - Complete Level 5 (Walks with assist in room/hall) - Balance while stepping forward/back and can walk in room with assist - Complete Level 5 (Walks with assist in room/hall) - Balance while stepping forward/back and can walk in room with assist - Complete Level 2 (Chairfast) - Balance while sitting on edge of bed and cannot stand   Is the above level different from baseline mobility prior to current illness? -- Yes - Recommend PT order -- Yes - Recommend PT order Yes - Recommend PT order    Row Name 12/14/23 2300           Does patient have an order for bedrest or is patient medically unstable No -  Continue assessment       What is the highest level of mobility based on the progressive mobility assessment? Level 2 (Chairfast) - Balance while sitting on edge of bed and cannot stand       Is the above level different from baseline mobility prior to current illness? Yes - Recommend PT  order                Barriers to discharge: None Disposition Plan: [facility] HH orders placed: PT, OT Status is: Inpatient  Objective: Blood pressure 115/66, pulse 65, temperature 98.4 F (36.9 C), resp. rate 16, height 5\' 3"  (1.6 m), weight 67.1 kg, SpO2 97%.  Examination:  Physical Exam Constitutional:      Appearance: Normal appearance.  HENT:     Head: Normocephalic and atraumatic.     Mouth/Throat:     Mouth: Mucous membranes are moist.  Eyes:     Extraocular Movements: Extraocular movements intact.  Cardiovascular:     Rate and Rhythm: Normal rate and regular rhythm.  Pulmonary:     Effort: Pulmonary effort is normal. No respiratory distress.     Breath sounds: Normal breath sounds. No wheezing.  Abdominal:     General: Bowel sounds are normal. There is no distension.     Palpations: Abdomen is soft.     Tenderness: There is no abdominal tenderness.  Musculoskeletal:        General: Normal range of motion.     Cervical back: Normal range of motion and neck supple.  Skin:    General: Skin is warm and dry.  Neurological:     Mental Status: She is alert. Mental status is at baseline. She is disoriented.  Psychiatric:        Mood and Affect: Mood normal.      Consultants:    Procedures:    Data Reviewed: Results for orders placed or performed during the hospital encounter of 12/14/23 (from the past 24 hours)  Basic metabolic panel     Status: Abnormal   Collection Time: 12/16/23  4:43 AM  Result Value Ref Range   Sodium 131 (L) 135 - 145 mmol/L   Potassium 3.9 3.5 - 5.1 mmol/L   Chloride 101 98 - 111 mmol/L   CO2 22 22 - 32 mmol/L   Glucose, Bld 93 70 - 99 mg/dL   BUN 12 8 - 23 mg/dL   Creatinine, Ser 1.47 0.44 - 1.00 mg/dL   Calcium 9.1 8.9 - 82.9 mg/dL   GFR, Estimated >56 >21 mL/min   Anion gap 8 5 - 15  CBC with Differential/Platelet     Status: None   Collection Time: 12/16/23  4:43 AM  Result Value Ref Range   WBC 5.2 4.0 - 10.5 K/uL    RBC 4.52 3.87 - 5.11 MIL/uL   Hemoglobin 13.7 12.0 - 15.0 g/dL   HCT 30.8 65.7 - 84.6 %   MCV 91.8 80.0 - 100.0 fL   MCH 30.3 26.0 - 34.0 pg   MCHC 33.0 30.0 - 36.0 g/dL   RDW 96.2 95.2 - 84.1 %   Platelets 249 150 - 400 K/uL   nRBC 0.0 0.0 - 0.2 %   Neutrophils Relative % 49 %   Neutro Abs 2.6 1.7 - 7.7 K/uL   Lymphocytes Relative 30 %   Lymphs Abs 1.6 0.7 - 4.0 K/uL   Monocytes Relative 11 %   Monocytes Absolute 0.6 0.1 - 1.0 K/uL   Eosinophils Relative 9 %  Eosinophils Absolute 0.5 0.0 - 0.5 K/uL   Basophils Relative 1 %   Basophils Absolute 0.1 0.0 - 0.1 K/uL   Immature Granulocytes 0 %   Abs Immature Granulocytes 0.02 0.00 - 0.07 K/uL  Magnesium     Status: None   Collection Time: 12/16/23  4:43 AM  Result Value Ref Range   Magnesium 2.3 1.7 - 2.4 mg/dL    I have reviewed pertinent nursing notes, vitals, labs, and images as necessary. I have ordered labwork to follow up on as indicated.  I have reviewed the last notes from staff over past 24 hours. I have discussed patient's care plan and test results with nursing staff, CM/SW, and other staff as appropriate.  Time spent: Greater than 50% of the 55 minute visit was spent in counseling/coordination of care for the patient as laid out in the A&P.   LOS: 1 day   Lewie Chamber, MD [facility] 12/16/2023, 2:11 PM

## 2023-12-16 NOTE — Hospital Course (Signed)
 Doris [name] is a 73 year old female with PMH advanced dementia, CHF, CHB s/p pacemaker placement, HTN, A-fib, BCC who presented to the hospital with reported worsening confusion from her nursing facility.  There was also some complaints of abdominal discomfort; no nausea or vomiting. She had further workup in the ER.  UA noted with negative nitrite, moderate LE, greater than 50 WBC, many bacteria.  She was started on Rocephin and admitted for further workup.  Urine culture grew Citrobacter and antibiotics were transitioned to nitrofurantion after sensitivity panel reviewed.

## 2023-12-16 NOTE — Assessment & Plan Note (Signed)
-   last EF measured was 30 to 35% on June 2023 as in Care Everywhere. Continue bisoprolol. Hold spironolactone due to hyponatremia.

## 2023-12-16 NOTE — Assessment & Plan Note (Deleted)
-   Difficult to interpret altered mentation in setting of underlying dementia but treating UTI regardless - Appears to be back to baseline

## 2023-12-16 NOTE — Assessment & Plan Note (Signed)
-   Patient presented from nursing facility due to concern for altered mentation and lower abdominal discomfort - UA reviewed, essentially consistent with UTI; urine culture only grew 70,000 Citrobacter but will continue treatment course; sensitivities reviewed, resistant to Rocephin, thus discontinued and transitioned to nitrofurantoin to complete 5 day course -Voiding well with no complaints

## 2023-12-16 NOTE — TOC Progression Note (Signed)
 Transition of Care [facility]) - Progression Note    Patient Details  Name: Doris [name] MRN: [id number] Date of Birth: Jan 17, 1951  [facility]) CM/SW Contact  Jessie Foot, RN Phone Number: 12/16/2023, 3:46 PM  Clinical Narrative:     No PT/OT recommendations. Patient ambulated with 200 ft without assistant.  Expected Discharge Plan: Assisted Living Barriers to Discharge: Continued Medical Work up  Expected Discharge Plan and Services   Discharge Planning Services: CM Consult Post Acute Care Choice: NA Living arrangements for the past 2 months: Assisted Living Facility                 DME Arranged: N/A DME Agency: NA       HH Arranged: NA HH Agency: NA         Social Determinants of Health (SDOH) Interventions SDOH Screenings   Food Insecurity: No Food Insecurity (12/15/2023)  Housing: Unknown (12/15/2023)  Transportation Needs: Patient Unable To Answer (12/15/2023)  Utilities: Not At Risk (12/15/2023)  Financial Resource Strain: Patient Declined (12/08/2022)   Received from [facility], [facility]  Physical Activity: Unknown (01/26/2018)   Received from [facility], [facility]  Social Connections: Moderately Isolated (12/15/2023)  Tobacco Use: Medium Risk (12/15/2023)    Readmission Risk Interventions    12/15/2023    3:37 PM  Readmission Risk Prevention Plan  Post Dischage Appt Complete  Medication Screening Complete  Transportation Screening Complete

## 2023-12-16 NOTE — Plan of Care (Signed)

## 2023-12-17 ENCOUNTER — Other Ambulatory Visit (HOSPITAL_BASED_OUTPATIENT_CLINIC_OR_DEPARTMENT_OTHER): Payer: Self-pay

## 2023-12-17 ENCOUNTER — Other Ambulatory Visit (HOSPITAL_COMMUNITY): Payer: Self-pay

## 2023-12-17 DIAGNOSIS — N390 Urinary tract infection, site not specified: Secondary | ICD-10-CM | POA: Diagnosis not present

## 2023-12-17 DIAGNOSIS — G9341 Metabolic encephalopathy: Secondary | ICD-10-CM | POA: Diagnosis not present

## 2023-12-17 DIAGNOSIS — E871 Hypo-osmolality and hyponatremia: Secondary | ICD-10-CM | POA: Diagnosis not present

## 2023-12-17 MED ORDER — TRAMADOL HCL 50 MG PO TABS
50.0000 mg | ORAL_TABLET | Freq: Four times a day (QID) | ORAL | Status: DC | PRN
  Administered 2023-12-17: 50 mg via ORAL
  Filled 2023-12-17: qty 1

## 2023-12-17 MED ORDER — NITROFURANTOIN MONOHYD MACRO 100 MG PO CAPS
100.0000 mg | ORAL_CAPSULE | Freq: Two times a day (BID) | ORAL | 0 refills | Status: AC
  Filled 2023-12-17: qty 6, 3d supply, fill #0

## 2023-12-17 MED ORDER — TRAMADOL HCL 50 MG PO TABS: 50.0000 mg | ORAL_TABLET | Freq: Every day | ORAL | 0 refills | Status: AC | PRN

## 2023-12-17 NOTE — TOC Progression Note (Signed)
 Transition of Care [facility]) - Progression Note    Patient Details  Name: Doris [name] MRN: [id number] Date of Birth: August 03, 1951  Transition of Care [facility]) CM/SW Contact  Adrian Prows, RN Phone Number: 12/17/2023, 11:23 AM  Clinical Narrative:    Ivan Croft, Med Tech at [facility]; she says pt can return to facility; she gave RM # A*, call report # [phone]; Alfredia Client is not sure if facility has contract agency for HHPT/PT'; she will have admissions call this RN, CM  [phone]- return call from [place]. Admissions; she says contract agency is [facility] for PT/OT; she also requests FL2, DC summary, and H&P be emailed to [email or web address].   Expected Discharge Plan: Assisted Living Barriers to Discharge: Continued Medical Work up  Expected Discharge Plan and Services   Discharge Planning Services: CM Consult Post Acute Care Choice: NA Living arrangements for the past 2 months: Assisted Living Facility Expected Discharge Date: 12/17/23               DME Arranged: N/A DME Agency: NA       HH Arranged: NA HH Agency: NA         Social Determinants of Health (SDOH) Interventions SDOH Screenings   Food Insecurity: No Food Insecurity (12/15/2023)  Housing: Unknown (12/15/2023)  Transportation Needs: Patient Unable To Answer (12/15/2023)  Utilities: Not At Risk (12/15/2023)  Financial Resource Strain: Patient Declined (12/08/2022)   Received [facility], [facility]  Physical Activity: Unknown (01/26/2018)   Received from [facility], [facility]  Social Connections: Moderately Isolated (12/15/2023)  Tobacco Use: Medium Risk (12/15/2023)    Readmission Risk Interventions    12/15/2023    3:37 PM  Readmission Risk Prevention Plan  Post Dischage Appt Complete  Medication Screening Complete  Transportation Screening Complete

## 2023-12-17 NOTE — Progress Notes (Deleted)
 Occupational Therapy Treatment Patient Details Name: Doris [name] MRN: [id number] DOB: May 29, 1951 Today's Date: 12/17/2023   History of present illness 73 yo female admitted with encephalopathy. Hx of dementia, Afib, CHF, scoliosis. Pt is from [facility]   OT comments  Patient seen for skilled OT session this am and making steady gains toward goals. Patient with improved functional amb to and from bathroom for toileting and seated and standing level BADL's with RW support, assistance and cues for safety. Patient continues to require Acute care skilled OT services with recommendation for HHOT once discharged back to ALF. Requires RW for ambulation safety.       If plan is discharge home, recommend the following:  A little help with walking and/or transfers;A little help with bathing/dressing/bathroom;Assistance with cooking/housework;Assistance with feeding;Direct supervision/assist for medications management;Direct supervision/assist for financial management;Assist for transportation;Help with stairs or ramp for entrance;Supervision due to cognitive status   Equipment Recommendations   (RW)       Precautions / Restrictions Precautions Precautions: Fall Restrictions Weight Bearing Restrictions Per Provider Order: No       Mobility Bed Mobility Overal bed mobility: Needs Assistance Bed Mobility: Supine to Sit     Supine to sit: Supervision, HOB elevated     General bed mobility comments: Increased time. Cues required.    Transfers Overall transfer level: Needs assistance Equipment used: Rolling walker (2 wheels) Transfers: Sit to/from Stand Sit to Stand: Contact guard assist, Min assist           General transfer comment: cues for hand placement     Balance Overall balance assessment: Needs assistance Sitting-balance support: No upper extremity supported, Feet supported Sitting balance-Leahy Scale: Fair     Standing balance support: During functional  activity, Single extremity supported Standing balance-Leahy Scale: Fair                             ADL either performed or assessed with clinical judgement   ADL Overall ADL's : Needs assistance/impaired Eating/Feeding: Set up Eating/Feeding Details (indicate cue type and reason): cues for scanning Grooming: Wash/dry hands;Wash/dry face;Oral care;Brushing hair;Supervision/safety;Sitting Grooming Details (indicate cue type and reason): cues for sequencing Upper Body Bathing: Contact guard assist;Sitting   Lower Body Bathing: Sitting/lateral leans;Sit to/from stand;Minimal assistance   Upper Body Dressing : Contact guard assist;Sitting;Cueing for safety;Cueing for sequencing   Lower Body Dressing: Minimal assistance;Cueing for compensatory techniques;Cueing for back precautions   Toilet Transfer: Minimal assistance;Grab bars;Rolling walker (2 wheels);Cueing for sequencing   Toileting- Clothing Manipulation and Hygiene: Minimal assistance       Functional mobility during ADLs: Contact guard assist;Minimal assistance;Cueing for safety;Cueing for sequencing;Rolling walker (2 wheels)       Vision   Vision Assessment?: Wears glasses for reading;No apparent visual deficits   Perception Perception Perception: Within Functional Limits   Praxis Praxis Praxis: Impaired Praxis Impairment Details: Motor planning   Communication Communication Communication: No apparent difficulties   Cognition Arousal: Alert Behavior During Therapy: WFL for tasks assessed/performed Cognition: Cognition impaired   Orientation impairments: Person, Place Awareness: Intellectual awareness impaired, Online awareness impaired Memory impairment (select all impairments): Short-term memory Attention impairment (select first level of impairment): Sustained attention Executive functioning impairment (select all impairments): Organization, Sequencing, Reasoning, Problem solving OT - Cognition  Comments: pleasant and cooperative with redirection easily throughout session                 Following commands: Impaired Following commands  impaired: Follows one step commands with increased time      Cueing   Cueing Techniques: Verbal cues, Visual cues             Pertinent Vitals/ Pain       Pain Assessment Pain Assessment: No/denies pain   Frequency  Min 2X/week        Progress Toward Goals  OT Goals(current goals can now be found in the care plan section)     Acute Rehab OT Goals OT Goal Formulation: With patient Time For Goal Achievement: 12/31/23   AM-PAC OT "6 Clicks" Daily Activity     Outcome Measure   Help from another person eating meals?: A Little Help from another person taking care of personal grooming?: A Little Help from another person toileting, which includes using toliet, bedpan, or urinal?: A Little Help from another person bathing (including washing, rinsing, drying)?: A Little Help from another person to put on and taking off regular upper body clothing?: A Little Help from another person to put on and taking off regular lower body clothing?: A Little 6 Click Score: 18    End of Session Equipment Utilized During Treatment: Gait belt;Rolling walker (2 wheels)  OT Visit Diagnosis: Unsteadiness on feet (R26.81);Muscle weakness (generalized) (M62.81);Cognitive communication deficit (R41.841)   Activity Tolerance Patient tolerated treatment well   Patient Left in chair;with call bell/phone within reach;with chair alarm set   Nurse Communication Mobility status        Time: 0925-1000 OT Time Calculation (min): 35 min  Charges: OT General Charges $OT Visit: 1 Visit OT Evaluation $OT Eval Moderate Complexity: 1 Mod OT Treatments $Self Care/Home Management : 8-22 mins  Merced Hanners OT/L Acute Rehabilitation Department  [phone]  12/17/2023, 1:26 PM

## 2023-12-17 NOTE — Evaluation (Signed)
 Occupational Therapy Evaluation Patient Details Name: Doris [name] MRN: [id number] DOB: [birth year] Today's Date: 12/17/2023   History of Present Illness   73 yo female admitted with encephalopathy. Hx of dementia, Afib, CHF, scoliosis. Pt is from [facility]     Clinical Impressions  PTA, patient was living in ALF with some assistance using [facility] for mobility and ADL's. Patient presents with cognitive/safety, balance, generalized strength and activity tolerance deficits impacting ADL's and mobility.  Patient requires cues and assistance for functional amb to and from bathroom for toileting and seated and standing level BADL's with RW support, assistance and cues for safety. Patient continues to require Acute care skilled OT services with recommendation for HHOT once discharged back to ALF. Requires RW for ambulation safety.       If plan is discharge home, recommend the following:   A little help with walking and/or transfers;A little help with bathing/dressing/bathroom;Assistance with cooking/housework;Assistance with feeding;Direct supervision/assist for medications management;Direct supervision/assist for financial management;Assist for transportation;Help with stairs or ramp for entrance;Supervision due to cognitive status     Functional Status Assessment   Patient has had a recent decline in their functional status and demonstrates the ability to make significant improvements in function in a reasonable and predictable amount of time.     Equipment Recommendations    (RW)      Precautions/Restrictions   Precautions Precautions: Fall Restrictions Weight Bearing Restrictions Per Provider Order: No     Mobility Bed Mobility Overal bed mobility: Needs Assistance Bed Mobility: Supine to Sit     Supine to sit: Supervision, HOB elevated     General bed mobility comments: Increased time. Cues required.    Transfers Overall transfer level: Needs  assistance Equipment used: Rolling walker (2 wheels) Transfers: Sit to/from Stand Sit to Stand: Contact guard assist, Min assist           General transfer comment: cues for hand placement      Balance Overall balance assessment: Needs assistance Sitting-balance support: No upper extremity supported, Feet supported Sitting balance-Leahy Scale: Fair     Standing balance support: During functional activity, Single extremity supported Standing balance-Leahy Scale: Fair                             ADL either performed or assessed with clinical judgement   ADL Overall ADL's : Needs assistance/impaired Eating/Feeding: Set up Eating/Feeding Details (indicate cue type and reason): cues for scanning Grooming: Wash/dry hands;Wash/dry face;Oral care;Brushing hair;Supervision/safety;Sitting Grooming Details (indicate cue type and reason): cues for sequencing Upper Body Bathing: Contact guard assist;Sitting   Lower Body Bathing: Sitting/lateral leans;Sit to/from stand;Minimal assistance   Upper Body Dressing : Contact guard assist;Sitting;Cueing for safety;Cueing for sequencing   Lower Body Dressing: Minimal assistance;Cueing for compensatory techniques;Cueing for back precautions   Toilet Transfer: Minimal assistance;Grab bars;Rolling walker (2 wheels);Cueing for sequencing   Toileting- Clothing Manipulation and Hygiene: Minimal assistance       Functional mobility during ADLs: Contact guard assist;Minimal assistance;Cueing for safety;Cueing for sequencing;Rolling walker (2 wheels)       Vision Baseline Vision/History: 1 Wears glasses;0 No visual deficits Ability to See in Adequate Light: 0 Adequate Patient Visual Report: No change from baseline Vision Assessment?: Wears glasses for reading;No apparent visual deficits     Perception Perception: Within Functional Limits       Praxis Praxis: Impaired Praxis Impairment Details: Motor planning     Pertinent  Vitals/Pain Pain Assessment  Pain Assessment: No/denies pain     Extremity/Trunk Assessment Upper Extremity Assessment Upper Extremity Assessment: Generalized weakness   Lower Extremity Assessment Lower Extremity Assessment: Generalized weakness   Cervical / Trunk Assessment Cervical / Trunk Assessment: Normal   Communication Communication Communication: No apparent difficulties   Cognition Arousal: Alert Behavior During Therapy: WFL for tasks assessed/performed Cognition: Cognition impaired   Orientation impairments: Person, Place Awareness: Intellectual awareness impaired, Online awareness impaired Memory impairment (select all impairments): Short-term memory Attention impairment (select first level of impairment): Sustained attention Executive functioning impairment (select all impairments): Organization, Sequencing, Reasoning, Problem solving OT - Cognition Comments: pleasant and cooperative with redirection easily throughout session                 Following commands: Impaired Following commands impaired: Follows one step commands with increased time     Cueing  General Comments   Cueing Techniques: Verbal cues;Visual cues  no skin issues opr concerns           Home Living Family/patient expects to be discharged to:: Assisted living                             Home Equipment: Gilmer Mor - single point;Shower seat          Prior Functioning/Environment Prior Level of Function : Needs assist             Mobility Comments: ambulatory      OT Problem List: Decreased strength;Decreased activity tolerance;Impaired balance (sitting and/or standing);Decreased cognition;Decreased safety awareness;Decreased knowledge of use of DME or AE   OT Treatment/Interventions: Self-care/ADL training;Therapeutic exercise;Neuromuscular education;Energy conservation;DME and/or AE instruction;Therapeutic activities;Cognitive  remediation/compensation;Patient/family education;Visual/perceptual remediation/compensation;Balance training      OT Goals(Current goals can be found in the care plan section)   Acute Rehab OT Goals Patient Stated Goal: to get stronger OT Goal Formulation: With patient Time For Goal Achievement: 12/31/23 Potential to Achieve Goals: Good   OT Frequency:  Min 2X/week    AM-PAC OT "6 Clicks" Daily Activity     Outcome Measure Help from another person eating meals?: A Little Help from another person taking care of personal grooming?: A Little Help from another person toileting, which includes using toliet, bedpan, or urinal?: A Little Help from another person bathing (including washing, rinsing, drying)?: A Little Help from another person to put on and taking off regular upper body clothing?: A Little Help from another person to put on and taking off regular lower body clothing?: A Little 6 Click Score: 18   End of Session Equipment Utilized During Treatment: Gait belt;Rolling walker (2 wheels) Nurse Communication: Mobility status  Activity Tolerance: Patient tolerated treatment well Patient left: in chair;with call bell/phone within reach;with chair alarm set  OT Visit Diagnosis: Unsteadiness on feet (R26.81);Muscle weakness (generalized) (M62.81);Cognitive communication deficit (R41.841) Symptoms and signs involving cognitive functions: Other cerebrovascular disease                Time: 0925-1000 OT Time Calculation (min): 35 min Charges:  OT General Charges $OT Visit: 1 Visit OT Evaluation $OT Eval Moderate Complexity: 1 Mod OT Treatments $Self Care/Home Management : 8-22 mins Claudis Giovanelli OT/L Acute Rehabilitation Department  [phone]   12/17/2023, 1:34 PM

## 2023-12-17 NOTE — Plan of Care (Signed)

## 2023-12-17 NOTE — [place] FL2 (Signed)
 [place] MEDICAID [place]2 LEVEL OF CARE FORM     IDENTIFICATION  Patient Name: Doris [name] Birthdate: Sep 30, 1950 Sex: female Admission Date (Current Location): 12/14/2023  [facility] and [place] Number:  Producer, television/film/video and Address:  [facility],  [address], [place] [address]      Provider Number: [id number]  Attending Physician Name and Address:  Lewie Chamber, MD  Relative Name and Phone Number:  Mariane Masters (sister) [phone]    Current Level of Care: Hospital Recommended Level of Care: Assisted Living Facility Prior Approval Number:    Date Approved/Denied:   PASRR Number:    Discharge Plan: Other (Comment) (Assisted Living)    Current Diagnoses: Patient Active Problem List   Diagnosis Date Noted   Hyponatremia 12/15/2023   UTI (urinary tract infection) 12/15/2023   Acute encephalopathy 12/15/2023   Acute metabolic encephalopathy 12/14/2023   Dementia without behavioral disturbance (HCC) 03/19/2023   Osteoporosis without current pathological fracture 04/08/2021   Dilated cardiomyopathy (HCC) 09/26/2019   Painful veins 08/07/2018   Valvular heart disease 06/07/2018   Complete heart block, transient (HCC) 04/22/2018   S/P mitral valve repair 04/22/2018   CHF (congestive heart failure), NYHA class II, chronic, systolic (HCC) 04/20/2018   History of hyperlipidemia, mixed 07/10/2009   Tubular adenoma of colon 03/14/2009    Orientation RESPIRATION BLADDER Height & Weight     Self, Place  Normal External catheter Weight: 67.1 kg Height:  5\' 3"  (160 cm)  BEHAVIORAL SYMPTOMS/MOOD NEUROLOGICAL BOWEL NUTRITION STATUS      Incontinent Diet (Heart Healthy)  AMBULATORY STATUS COMMUNICATION OF NEEDS Skin   Limited Assist Verbally Normal                       Personal Care Assistance Level of Assistance  Bathing, Feeding, Dressing Bathing Assistance: Limited assistance Feeding assistance: Independent Dressing Assistance: Limited  assistance     Functional Limitations Info  Sight, Hearing, Speech Sight Info: Impaired (glasses) Hearing Info: Adequate Speech Info: Adequate    SPECIAL CARE FACTORS FREQUENCY  PT (By licensed PT), OT (By licensed OT)     PT Frequency: 5x/week OT Frequency: 5x/week            Contractures Contractures Info: Not present    Additional Factors Info  Allergies, Code Status Code Status Info: Full Allergies Info: Benzocaine, Carboxymethylcellulose, Clarithromycin, Doxycycline, Duloxetine, Fentanyl, Lidocaine, Metronidazole, Monosodium Glutamate, Penicillins           Current Medications (12/17/2023):  This is the current hospital active medication list Current Facility-Administered Medications  Medication Dose Route Frequency Provider Last Rate Last Admin   bisoprolol (ZEBETA) tablet 2.5 mg  2.5 mg Oral Daily Eduard Clos, MD   2.5 mg at 12/15/23 0847   donepezil (ARICEPT) tablet 10 mg  10 mg Oral QHS Eduard Clos, MD   10 mg at 12/16/23 2149   enoxaparin (LOVENOX) injection 40 mg  40 mg Subcutaneous Q24H Eduard Clos, MD   40 mg at 12/17/23 5621   gabapentin (NEURONTIN) capsule 300 mg  300 mg Oral TID Lewie Chamber, MD   300 mg at 12/17/23 0914   methocarbamol (ROBAXIN) tablet 500 mg  500 mg Oral Q8H PRN Lewie Chamber, MD   500 mg at 12/17/23 0914   nitrofurantoin (macrocrystal-monohydrate) (MACROBID) capsule 100 mg  100 mg Oral Orlene Erm, MD   100 mg at 12/17/23 0914   polyethylene glycol (MIRALAX / GLYCOLAX) packet 17 g  17 g Oral Daily Lewie Chamber, MD   17 g at 12/17/23 0981   senna-docusate (Senokot-S) tablet 1 tablet  1 tablet Oral BID Lewie Chamber, MD   1 tablet at 12/17/23 0914   traMADol (ULTRAM) tablet 50 mg  50 mg Oral Q6H PRN Lewie Chamber, MD   50 mg at 12/17/23 1111     Discharge Medications: Please see discharge summary for a list of discharge medications.  Relevant Imaging Results:  Relevant Lab  Results:   Additional Information SSN [id number]  Adrian Prows, RN

## 2023-12-17 NOTE — TOC Transition Note (Addendum)
 Transition of Care [facility]) - Discharge Note   Patient Details  Name: Doris [name] MRN: [id number] Date of Birth: 06-20-1951  Transition [facility] CM/SW Contact:  Adrian Prows, RN Phone Number: 12/17/2023, 11:44 AM   Clinical Narrative:    D/C orders received; spoke w/ pt's sister Mariane Masters ([phone]); she notified and agrees to d/c plan; she will transport pt back to [facility]; Ms Orvan Falconer says she will be back at hospital around 2 PM; secure email with documents emailed to [email or web address]:  face sheet, H&P, D/C summary, orders for HHPT/OT., and face to face;   -1150- spoke w/ Rayfield Citizen at facility; she confirmed receipt of secure email; she also requested RX to be filled at OP pharmacy and be bought to facility w/ pt; Dr Frederick Peers notified via secure chat; he will send RX to OP pharmacy, and meds need to be delivered to bed; no TOC needs.    Barriers to Discharge: Continued Medical Work up   Patient Goals and CMS Choice Patient states their goals for this hospitalization and ongoing recovery are:: Return to [facility]   Choice offered to / list presented to : NA      Discharge Placement                       Discharge Plan and Services Additional resources added to the After Visit Summary for     Discharge Planning Services: CM Consult Post Acute Care Choice: NA          DME Arranged: N/A DME Agency: NA       HH Arranged: NA HH Agency: NA        Social Drivers of Health (SDOH) Interventions SDOH Screenings   Food Insecurity: No Food Insecurity (12/15/2023)  Housing: Unknown (12/15/2023)  Transportation Needs: Patient Unable To Answer (12/15/2023)  Utilities: Not At Risk (12/15/2023)  Financial Resource Strain: Patient Declined (12/08/2022)   Received from [facility], [facility]  Physical Activity: Unknown (01/26/2018)   Received from [facility], [facility]  Social Connections: Moderately Isolated (12/15/2023)  Tobacco Use: Medium Risk (12/15/2023)     Readmission Risk Interventions    12/15/2023    3:37 PM  Readmission Risk Prevention Plan  Post Dischage Appt Complete  Medication Screening Complete  Transportation Screening Complete

## 2023-12-17 NOTE — Discharge Summary (Signed)
 Physician Discharge Summary   Doris [name]:[id number] DOB: April 20, 1951 DOA: 12/14/2023  PCP: Dorothey Baseman, MD  Admit date: 12/14/2023 Discharge date:  12/17/2023  Admitted From: [facility] Disposition:  [facility]  Discharging physician: Lewie Chamber, MD Barriers to discharge:   Home Health: PT, OT  Discharge Condition: stable CODE STATUS: Full  Diet recommendation:  Diet Orders (From admission, onward)     Start     Ordered   12/17/23 0000  Diet - low sodium heart healthy        12/17/23 1104   12/15/23 0453  Diet Heart Room service appropriate? Yes; Fluid consistency: Thin  Diet effective now       Question Answer Comment  Room service appropriate? Yes   Fluid consistency: Thin      12/15/23 0453            Hospital Course: Doris [name] is a 73 year old female with PMH advanced dementia, CHF, CHB s/p pacemaker placement, HTN, A-fib, BCC who presented to the hospital with reported worsening confusion from her nursing facility.  There was also some complaints of abdominal discomfort; no nausea or vomiting. She had further workup in the ER.  UA noted with negative nitrite, moderate LE, greater than 50 WBC, many bacteria.  She was started on Rocephin and admitted for further workup.  Urine culture grew Citrobacter and antibiotics were transitioned to nitrofurantion after sensitivity panel reviewed.  Assessment and Plan: * Acute metabolic encephalopathy - Difficult to interpret altered mentation in setting of underlying dementia but treating UTI regardless - Appears to be back to baseline  UTI (urinary tract infection) - Patient presented from nursing facility due to concern for altered mentation and lower abdominal discomfort - UA reviewed, essentially consistent with UTI; urine culture only grew 70,000 Citrobacter but will continue treatment course; sensitivities reviewed, resistant to Rocephin, thus discontinued and transitioned to nitrofurantoin to  complete 5 day course -Voiding well with no complaints  Hyponatremia - Presumed hypovolemic and prerenal from poor intake prior to admission - Has improved s/p admission  Dementia without behavioral disturbance (HCC) - Continue Aricept  Complete heart block, 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 Continue bisoprolol. Hold spironolactone due to hyponatremia.     The patient's acute and chronic medical conditions were treated accordingly. On day of discharge, patient was felt deemed stable for discharge. Patient/family member advised to call PCP or come back to ER if needed.   Principal Diagnosis: Acute metabolic encephalopathy  Discharge Diagnoses: Active Hospital Problems   Diagnosis Date Noted   Acute metabolic encephalopathy 12/14/2023    Priority: 2.   UTI (urinary tract infection) 12/15/2023    Priority: 1.   Hyponatremia 12/15/2023    Priority: 3.   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     Discharge Instructions     Diet - low sodium heart healthy   Complete by: As directed    Increase activity slowly   Complete by: As directed       Allergies as of 12/17/2023  Reactions   Benzocaine Other (See Comments)   Peripheral Neuropathy (Med tech "Marylene Land C." @ [facility] had no record of this)   Carboxymethylcellulose Swelling, Other (See Comments)    (Med tech "Marylene Land C." @ [facility] had no record of this)   Clarithromycin Other (See Comments)   PERIPHERAL NEUROPATHY   (Med tech "Marylene Land C."  @ [facility] had no record of this)   Doxycycline Other (See Comments)   Stomach upset and Neuropathy (Med tech "Marylene Land C." @ [facility] had no record of this)   Duloxetine Other (See Comments)   Headache  (Med tech "Marylene Land C." @ [facility] had no record of this)   Fentanyl Other (See Comments)   Suicidal thoughts  (Med tech "Angela C." @ [facility] had no record of this)   Lidocaine Other (See Comments)   UNABLE TO SPEAK, Dr thinks he hit a blood vessel (Med tech "Marylene Land C." @ [facility] had no record of this)   Metronidazole Other (See Comments)   PERIPHERAL NEUROPATHY (Med tech "Marylene Land C." @ [facility] had no record of this)   Monosodium Glutamate Other (See Comments)   (Med tech "Marylene Land C." @ [facility] had no record of this)   Penicillins Other (See Comments)   Identical twin had a childhood reaction- she was 73 years old (Med tech "Marylene Land C." @ [facility] had no record of this)        Medication List     STOP taking these medications    furosemide 20 MG tablet Commonly known as: LASIX       TAKE these medications    acetaminophen 500 MG tablet Commonly known as: TYLENOL Take 500 mg by mouth See admin instructions. Take 500 mg by mouth two times a day and an additional 500 mg every 6 hours as needed for pain or headaches   alendronate 70 MG tablet Commonly known as: FOSAMAX Take 70 mg by mouth every Monday.   B-complex with vitamin C tablet Take 1 tablet by mouth daily.   bisoprolol 5 MG tablet Commonly known as: ZEBETA TAKE 1/2 TABLET BY MOUTH DAILY What changed: when to take this   CALCIUM 600+D3 PO Take 1 tablet by mouth 2 (two) times daily with a meal.   donepezil 10 MG tablet Commonly known as: ARICEPT Take 10 mg by mouth at bedtime.   empagliflozin 10 MG Tabs tablet Commonly known as: Jardiance Take 1 tablet (10 mg total) by mouth daily before breakfast.   gabapentin 300 MG capsule Commonly known as:  NEURONTIN Take 600 mg by mouth at bedtime.   Krill Oil 350 MG Caps Take 350 mg by mouth daily with breakfast.   naproxen 500 MG tablet Commonly known as: NAPROSYN Take 500 mg by mouth 2 (two) times daily with a meal.   nitrofurantoin (macrocrystal-monohydrate) 100 MG capsule Commonly known as: MACROBID Take 1 capsule (100 mg total) by mouth 2 (two) times daily for 3 days.   polyethylene glycol powder 17 GM/SCOOP powder Commonly known as: GLYCOLAX/MIRALAX Take 17 g by mouth 2 (two) times daily as needed for mild constipation (to be mixed into 6 ounces of water).   PreserVision/Lutein Caps Take 1 capsule by mouth 2 (two) times daily.   spironolactone 25 MG tablet Commonly known as: ALDACTONE TAKE 1/2 TABLET BY MOUTH DAILY What changed: how much to take   traMADol 50 MG tablet Commonly known as: ULTRAM Take 1 tablet (50 mg total) by mouth daily as  needed (for pain).   Vitamin K2 100 MCG Caps Take 100 mcg by mouth daily.   Voltaren 1 % Gel Generic drug: diclofenac Sodium Apply 2 g topically See admin instructions. Apply 2 grams topically to the neck and back in the morning and at bedtime        Allergies  Allergen Reactions   Benzocaine Other (See Comments)    Peripheral Neuropathy (Med tech "Marylene Land C." @ [facility] had no record of this)   Carboxymethylcellulose Swelling and Other (See Comments)     (Med tech "Marylene Land C." @ [facility] had no record of this)   Clarithromycin Other (See Comments)    PERIPHERAL NEUROPATHY   (Med tech "Marylene Land C." @ [facility] had no record of this)   Doxycycline Other (See Comments)    Stomach upset and Neuropathy (Med tech "Marylene Land C." @ [facility] had no record of this)   Duloxetine Other (See Comments)    Headache  (Med tech "Marylene Land C." @ [facility] had no record of this)   Fentanyl Other (See Comments)    Suicidal thoughts  (Med tech "Marylene Land C." @ [facility] had no record of this)   Lidocaine Other (See  Comments)    UNABLE TO SPEAK, Dr thinks he hit a blood vessel (Med tech "Marylene Land C." @ [facility] had no record of this)   Metronidazole Other (See Comments)    PERIPHERAL NEUROPATHY (Med tech "Marylene Land C." @ [facility] had no record of this)   Monosodium Glutamate Other (See Comments)    (Med tech "Marylene Land C." @ [facility] had no record of this)   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- she was 73 years old (Med tech "Marylene Land C." @ [facility] had no record of this)    Consultations:   Procedures:   Discharge Exam: BP 112/85 (BP Location: Right Arm)   Pulse (!) 59   Temp (!) 97.2 F (36.2 C)   Resp 16   Ht 5\' 3"  (1.6 m)   Wt 67.1 kg   SpO2 100%   BMI 26.20 kg/m  Physical Exam Constitutional:      Appearance: Normal appearance.  HENT:     Head: Normocephalic and atraumatic.     Mouth/Throat:     Mouth: Mucous membranes are moist.  Eyes:     Extraocular Movements: Extraocular movements intact.  Cardiovascular:     Rate and Rhythm: Normal rate and regular rhythm.  Pulmonary:     Effort: Pulmonary effort is normal. No respiratory distress.     Breath sounds: Normal breath sounds. No wheezing.  Abdominal:     General: Bowel sounds are normal. There is no distension.     Palpations: Abdomen is soft.     Tenderness: There is no abdominal tenderness.  Musculoskeletal:        General: Normal range of motion.     Cervical back: Normal range of motion and neck supple.  Skin:    General: Skin is warm and dry.  Neurological:     Mental Status: She is alert. Mental status is at baseline. She is disoriented.  Psychiatric:        Mood and Affect: Mood normal.      The results of significant diagnostics from this hospitalization (including imaging, microbiology, ancillary and laboratory) are listed below for reference.   Microbiology: Recent Results (from the past 240 hours)  Urine Culture     Status: Abnormal  Collection  Time: 12/14/23  6:00 PM   Specimen: Urine, Clean Catch  Result Value Ref Range Status   Specimen Description   Final    URINE, CLEAN CATCH Performed at [facility], [address], [place], [place] [address]    Special Requests   Final    NONE Performed at [facility], [address], [place], [place] [address]    Culture 70,000 COLONIES/mL CITROBACTER FREUNDII (A)  Final   Report Status 12/16/2023 FINAL  Final   Organism ID, Bacteria CITROBACTER FREUNDII (A)  Final      Susceptibility   Citrobacter freundii - MIC*    CEFEPIME 0.5 SENSITIVE Sensitive     CEFTRIAXONE >=64 RESISTANT Resistant     CIPROFLOXACIN <=0.25 SENSITIVE Sensitive     GENTAMICIN <=1 SENSITIVE Sensitive     IMIPENEM <=0.25 SENSITIVE Sensitive     NITROFURANTOIN <=16 SENSITIVE Sensitive     TRIMETH/SULFA <=20 SENSITIVE Sensitive     PIP/TAZO 64 INTERMEDIATE Intermediate ug/mL    * 70,000 COLONIES/mL CITROBACTER FREUNDII  Blood culture (routine x 2)     Status: None (Preliminary result)   Collection Time: 12/14/23  6:52 PM   Specimen: BLOOD  Result Value Ref Range Status   Specimen Description   Final    BLOOD BLOOD RIGHT ARM Performed at [facility], [address] [place], [place] [address]    Special Requests   Final    Blood Culture adequate volume BOTTLES DRAWN AEROBIC AND ANAEROBIC Performed at [facility], [address] [place], [place] [address]    Culture   Final    NO GROWTH 2 DAYS Performed at [facility], [address] [place], [place] [address]    Report Status PENDING  Incomplete  Blood culture (routine x 2)     Status: None (Preliminary result)   Collection Time: 12/14/23  6:57 PM   Specimen: BLOOD  Result Value Ref Range Status   Specimen Description   Final    BLOOD BLOOD LEFT ARM Performed at [facility], [address] [place], [place] [address]    Special Requests    Final    Blood Culture adequate volume BOTTLES DRAWN AEROBIC ONLY Performed at [facility], [address], [place], [place] [address]    Culture   Final    NO GROWTH 2 DAYS Performed at [facility], [address], [place], [place] [address]    Report Status PENDING  Incomplete     Labs: BNP (last 3 results) No results for input(s): "BNP" in the last 8760 hours. Basic Metabolic Panel: Recent Labs  Lab 12/14/23 1737 12/15/23 0723 12/15/23 1020 12/15/23 1219 12/16/23 0443  NA 127* 131* 130* 128* 131*  K 4.3 4.1 4.0 4.1 3.9  CL 96* 100 98 100 101  CO2 22 23 22 22 22   GLUCOSE 88 97 129* 111* 93  BUN 16 11 10 12 12   CREATININE 0.88 0.71 0.71 0.69 0.68  CALCIUM 9.0 9.1 9.0 8.9 9.1  MG  --   --   --   --  2.3   Liver Function Tests: Recent Labs  Lab 12/14/23 1737 12/15/23 0723  AST 22 24  ALT 18 20  ALKPHOS 43 46  BILITOT 0.6 0.8  PROT 6.6 7.0  ALBUMIN 4.5 4.2   Recent Labs  Lab 12/14/23 1737  LIPASE 58*   No results for input(s): "AMMONIA" in the last 168 hours. CBC: Recent  Labs  Lab 12/14/23 1737 12/15/23 0723 12/16/23 0443  WBC 8.9 5.3 5.2  NEUTROABS 6.4 3.4 2.6  HGB 12.6 13.7 13.7  HCT 37.2 42.7 41.5  MCV 88.4 92.8 91.8  PLT 243 275 249   Cardiac Enzymes: No results for input(s): "CKTOTAL", "CKMB", "CKMBINDEX", "TROPONINI" in the last 168 hours. BNP: Invalid input(s): "POCBNP" CBG: No results for input(s): "GLUCAP" in the last 168 hours. D-Dimer No results for input(s): "DDIMER" in the last 72 hours. Hgb A1c No results for input(s): "HGBA1C" in the last 72 hours. Lipid Profile No results for input(s): "CHOL", "HDL", "LDLCALC", "TRIG", "CHOLHDL", "LDLDIRECT" in the last 72 hours. Thyroid function studies Recent Labs    12/15/23 0723  TSH 1.605   Anemia work up No results for input(s): "VITAMINB12", "FOLATE", "FERRITIN", "TIBC", "IRON", "RETICCTPCT" in the last 72 hours. Urinalysis    Component Value  Date/Time   COLORURINE YELLOW 12/14/2023 1800   APPEARANCEUR CLEAR 12/14/2023 1800   LABSPEC 1.009 12/14/2023 1800   PHURINE 7.0 12/14/2023 1800   GLUCOSEU NEGATIVE 12/14/2023 1800   HGBUR LARGE (A) 12/14/2023 1800   BILIRUBINUR NEGATIVE 12/14/2023 1800   KETONESUR 15 (A) 12/14/2023 1800   PROTEINUR NEGATIVE 12/14/2023 1800   NITRITE NEGATIVE 12/14/2023 1800   LEUKOCYTESUR MODERATE (A) 12/14/2023 1800   Sepsis Labs Recent Labs  Lab 12/14/23 1737 12/15/23 0723 12/16/23 0443  WBC 8.9 5.3 5.2   Microbiology Recent Results (from the past 240 hours)  Urine Culture     Status: Abnormal   Collection Time: 12/14/23  6:00 PM   Specimen: Urine, Clean Catch  Result Value Ref Range Status   Specimen Description   Final    URINE, CLEAN CATCH Performed at [facility], [address] [place], [place] [address]    Special Requests   Final    NONE Performed at [facility], [address] [place], [place] [address]    Culture 70,000 COLONIES/mL CITROBACTER FREUNDII (A)  Final   Report Status 12/16/2023 FINAL  Final   Organism ID, Bacteria CITROBACTER FREUNDII (A)  Final      Susceptibility   Citrobacter freundii - MIC*    CEFEPIME 0.5 SENSITIVE Sensitive     CEFTRIAXONE >=64 RESISTANT Resistant     CIPROFLOXACIN <=0.25 SENSITIVE Sensitive     GENTAMICIN <=1 SENSITIVE Sensitive     IMIPENEM <=0.25 SENSITIVE Sensitive     NITROFURANTOIN <=16 SENSITIVE Sensitive     TRIMETH/SULFA <=20 SENSITIVE Sensitive     PIP/TAZO 64 INTERMEDIATE Intermediate ug/mL    * 70,000 COLONIES/mL CITROBACTER FREUNDII  Blood culture (routine x 2)     Status: None (Preliminary result)   Collection Time: 12/14/23  6:52 PM   Specimen: BLOOD  Result Value Ref Range Status   Specimen Description   Final    BLOOD BLOOD RIGHT ARM Performed at [facility], [address] [place], [place] [address]    Special Requests   Final    Blood Culture  adequate volume BOTTLES DRAWN AEROBIC AND ANAEROBIC Performed at [facility], [address] [place], [place] [address]    Culture   Final    NO GROWTH 2 DAYS Performed at [facility], [address], [place], [place] [address]    Report Status PENDING  Incomplete  Blood culture (routine x 2)     Status: None (Preliminary result)   Collection Time: 12/14/23  6:57 PM   Specimen: BLOOD  Result Value Ref Range Status  Specimen Description   Final    BLOOD BLOOD LEFT ARM Performed at [facility], [address], [place], [place] [address]    Special Requests   Final    Blood Culture adequate volume BOTTLES DRAWN AEROBIC ONLY Performed at [facility], [address] [place], [place] [address]    Culture   Final    NO GROWTH 2 DAYS Performed at [facility], [address], [place], [place] [address]    Report Status PENDING  Incomplete    Procedures/Studies: CT ABDOMEN PELVIS W CONTRAST Result Date: 12/14/2023 CLINICAL DATA:  RLQ abd pain States being treated for UTI and today having more urination with pain. States having more confusion than usual. Back pain EXAM: CT ABDOMEN AND PELVIS WITH CONTRAST TECHNIQUE: Multidetector CT imaging of the abdomen and pelvis was performed using the standard protocol following bolus administration of intravenous contrast. RADIATION DOSE REDUCTION: This exam was performed according to the departmental dose-optimization program which includes automated exposure control, adjustment of the mA and/or kV according to patient size and/or use of iterative reconstruction technique. CONTRAST:  OMNIPAQUE IOHEXOL 300 MG/ML  SOLN COMPARISON:  CT abdomen pelvis 12/06/2021 FINDINGS: Lower chest: Cardiac leads partially visualized. Moderate to large hiatal hernia. Hepatobiliary: Chronic innumerable simple appearing cystic lesions of the liver. No gallstones, gallbladder wall thickening, or  pericholecystic fluid. No biliary dilatation. Pancreas: No focal lesion. Normal pancreatic contour. No surrounding inflammatory changes. No main pancreatic ductal dilatation. Spleen: Normal in size without focal abnormality. Adrenals/Urinary Tract: No adrenal nodule bilaterally. Bilateral kidneys enhance symmetrically. No hydronephrosis. No hydroureter. The urinary bladder is unremarkable. On delayed imaging, there is no urothelial wall thickening and there are no filling defects in the opacified portions of the bilateral collecting systems or ureters. Stomach/Bowel: Stomach is within normal limits. No evidence of bowel wall thickening or dilatation. Mobile medialized cecum. Colonic diverticulosis. Appendix appears normal. Vascular/Lymphatic: No abdominal aorta or iliac aneurysm. Mild atherosclerotic plaque. No abdominal, pelvic, or inguinal lymphadenopathy. Reproductive: Uterus and bilateral adnexa are unremarkable. Other: No intraperitoneal free fluid. No intraperitoneal free gas. No organized fluid collection. Musculoskeletal: No abdominal wall hernia or abnormality. No suspicious lytic or blastic osseous lesions. No acute displaced fracture. Levocurvature of the thoracolumbar spine. Multilevel degenerative changes of the spine. Similar-appearing possible chondroid lesion along the anterior femoral head. IMPRESSION: 1. Moderate to large hiatal hernia. 2. Colonic diverticulosis with no acute diverticulitis. 3. Cardiomegaly. Electronically Signed   By: Tish Frederickson M.D.   On: 12/14/2023 20:52   CT Head Wo Contrast Result Date: 12/14/2023 CLINICAL DATA:  ams EXAM: CT HEAD WITHOUT CONTRAST TECHNIQUE: Contiguous axial images were obtained from the base of the skull through the vertex without intravenous contrast. RADIATION DOSE REDUCTION: This exam was performed according to the departmental dose-optimization program which includes automated exposure control, adjustment of the mA and/or kV according to patient  size and/or use of iterative reconstruction technique. COMPARISON:  None Available. FINDINGS: Brain: Cerebral ventricle sizes are concordant with the degree of cerebral volume loss. Patchy and confluent areas of decreased attenuation are noted throughout the deep and periventricular white matter of the cerebral hemispheres bilaterally, compatible with chronic microvascular ischemic disease. No evidence of large-territorial acute infarction. No parenchymal hemorrhage. No mass lesion. No extra-axial collection. No mass effect or midline shift. No hydrocephalus. Basilar cisterns are patent. Vascular: No hyperdense vessel. Atherosclerotic calcifications are present within the cavernous internal carotid arteries. Skull: No acute fracture or focal lesion. Sinuses/Orbits: Paranasal sinuses  and mastoid air cells are clear. Bilateral lens replacement. Otherwise the orbits are unremarkable. Other: None. IMPRESSION: No acute intracranial abnormality. Electronically Signed   By: Tish Frederickson M.D.   On: 12/14/2023 20:10     Time coordinating discharge: Over 30 minutes    Lewie Chamber, MD  [facility] 12/17/2023, 11:05 AM

## 2023-12-18 DIAGNOSIS — N390 Urinary tract infection, site not specified: Secondary | ICD-10-CM | POA: Diagnosis not present

## 2023-12-18 DIAGNOSIS — R4182 Altered mental status, unspecified: Secondary | ICD-10-CM | POA: Diagnosis not present

## 2023-12-18 DIAGNOSIS — D519 Vitamin B12 deficiency anemia, unspecified: Secondary | ICD-10-CM | POA: Diagnosis not present

## 2023-12-18 DIAGNOSIS — I110 Hypertensive heart disease with heart failure: Secondary | ICD-10-CM | POA: Diagnosis not present

## 2023-12-18 DIAGNOSIS — R102 Pelvic and perineal pain: Secondary | ICD-10-CM | POA: Diagnosis not present

## 2023-12-18 DIAGNOSIS — G894 Chronic pain syndrome: Secondary | ICD-10-CM | POA: Diagnosis not present

## 2023-12-19 LAB — CULTURE, BLOOD (ROUTINE X 2)
Culture: NO GROWTH
Culture: NO GROWTH
Special Requests: ADEQUATE
Special Requests: ADEQUATE

## 2023-12-21 DIAGNOSIS — N390 Urinary tract infection, site not specified: Secondary | ICD-10-CM | POA: Diagnosis not present

## 2023-12-21 DIAGNOSIS — E871 Hypo-osmolality and hyponatremia: Secondary | ICD-10-CM | POA: Diagnosis not present

## 2023-12-21 DIAGNOSIS — F0390 Unspecified dementia without behavioral disturbance: Secondary | ICD-10-CM | POA: Diagnosis not present

## 2023-12-25 DIAGNOSIS — E871 Hypo-osmolality and hyponatremia: Secondary | ICD-10-CM | POA: Diagnosis not present

## 2023-12-25 DIAGNOSIS — F0390 Unspecified dementia without behavioral disturbance: Secondary | ICD-10-CM | POA: Diagnosis not present

## 2023-12-25 DIAGNOSIS — N390 Urinary tract infection, site not specified: Secondary | ICD-10-CM | POA: Diagnosis not present

## 2023-12-30 DIAGNOSIS — F0280 Dementia in other diseases classified elsewhere without behavioral disturbance: Secondary | ICD-10-CM | POA: Diagnosis not present

## 2023-12-30 DIAGNOSIS — I509 Heart failure, unspecified: Secondary | ICD-10-CM | POA: Diagnosis not present

## 2023-12-30 DIAGNOSIS — R54 Age-related physical debility: Secondary | ICD-10-CM | POA: Diagnosis not present

## 2023-12-30 DIAGNOSIS — M629 Disorder of muscle, unspecified: Secondary | ICD-10-CM | POA: Diagnosis not present

## 2024-01-01 DIAGNOSIS — I110 Hypertensive heart disease with heart failure: Secondary | ICD-10-CM | POA: Diagnosis not present

## 2024-01-01 DIAGNOSIS — I5022 Chronic systolic (congestive) heart failure: Secondary | ICD-10-CM | POA: Diagnosis not present

## 2024-01-01 DIAGNOSIS — K5900 Constipation, unspecified: Secondary | ICD-10-CM | POA: Diagnosis not present

## 2024-01-07 DIAGNOSIS — D519 Vitamin B12 deficiency anemia, unspecified: Secondary | ICD-10-CM | POA: Diagnosis not present

## 2024-01-07 DIAGNOSIS — I110 Hypertensive heart disease with heart failure: Secondary | ICD-10-CM | POA: Diagnosis not present

## 2024-01-08 DIAGNOSIS — G894 Chronic pain syndrome: Secondary | ICD-10-CM | POA: Diagnosis not present

## 2024-01-08 DIAGNOSIS — R102 Pelvic and perineal pain: Secondary | ICD-10-CM | POA: Diagnosis not present

## 2024-01-08 DIAGNOSIS — Q763 Congenital scoliosis due to congenital bony malformation: Secondary | ICD-10-CM | POA: Diagnosis not present

## 2024-01-18 ENCOUNTER — Ambulatory Visit: Payer: Medicare HMO | Admitting: Cardiology

## 2024-01-27 DIAGNOSIS — R531 Weakness: Secondary | ICD-10-CM | POA: Diagnosis not present

## 2024-01-27 DIAGNOSIS — B9689 Other specified bacterial agents as the cause of diseases classified elsewhere: Secondary | ICD-10-CM | POA: Diagnosis not present

## 2024-01-27 DIAGNOSIS — N390 Urinary tract infection, site not specified: Secondary | ICD-10-CM | POA: Diagnosis not present

## 2024-01-29 DIAGNOSIS — F0390 Unspecified dementia without behavioral disturbance: Secondary | ICD-10-CM | POA: Diagnosis not present

## 2024-01-29 DIAGNOSIS — I110 Hypertensive heart disease with heart failure: Secondary | ICD-10-CM | POA: Diagnosis not present

## 2024-01-29 DIAGNOSIS — I5022 Chronic systolic (congestive) heart failure: Secondary | ICD-10-CM | POA: Diagnosis not present

## 2024-02-02 ENCOUNTER — Ambulatory Visit (INDEPENDENT_AMBULATORY_CARE_PROVIDER_SITE_OTHER): Payer: Medicare HMO

## 2024-02-02 DIAGNOSIS — I442 Atrioventricular block, complete: Secondary | ICD-10-CM

## 2024-02-03 LAB — CUP PACEART REMOTE DEVICE CHECK
Battery Remaining Longevity: 108 mo
Battery Voltage: 2.99 V
Brady Statistic AP VP Percent: 1.15 %
Brady Statistic AP VS Percent: 19.55 %
Brady Statistic AS VP Percent: 0.26 %
Brady Statistic AS VS Percent: 79.03 %
Brady Statistic RA Percent Paced: 21.54 %
Brady Statistic RV Percent Paced: 1.42 %
Date Time Interrogation Session: 20250513012313
Implantable Lead Connection Status: 753985
Implantable Lead Connection Status: 753985
Implantable Lead Connection Status: 753985
Implantable Lead Implant Date: 20190805
Implantable Lead Implant Date: 20190805
Implantable Lead Implant Date: 20190805
Implantable Lead Location: 753858
Implantable Lead Location: 753859
Implantable Lead Location: 753860
Implantable Lead Model: 4398
Implantable Lead Model: 5076
Implantable Lead Model: 5076
Implantable Lead Serial Number: 11111
Implantable Pulse Generator Implant Date: 20190805
Lead Channel Impedance Value: 323 Ohm
Lead Channel Impedance Value: 380 Ohm
Lead Channel Impedance Value: 399 Ohm
Lead Channel Impedance Value: 418 Ohm
Lead Channel Impedance Value: 494 Ohm
Lead Channel Impedance Value: 551 Ohm
Lead Channel Impedance Value: 551 Ohm
Lead Channel Impedance Value: 551 Ohm
Lead Channel Impedance Value: 684 Ohm
Lead Channel Impedance Value: 798 Ohm
Lead Channel Impedance Value: 798 Ohm
Lead Channel Impedance Value: 817 Ohm
Lead Channel Impedance Value: 969 Ohm
Lead Channel Impedance Value: 969 Ohm
Lead Channel Pacing Threshold Amplitude: 0.625 V
Lead Channel Pacing Threshold Amplitude: 0.875 V
Lead Channel Pacing Threshold Amplitude: 1.125 V
Lead Channel Pacing Threshold Pulse Width: 0.4 ms
Lead Channel Pacing Threshold Pulse Width: 0.4 ms
Lead Channel Pacing Threshold Pulse Width: 0.4 ms
Lead Channel Sensing Intrinsic Amplitude: 1.375 mV
Lead Channel Sensing Intrinsic Amplitude: 1.375 mV
Lead Channel Sensing Intrinsic Amplitude: 12.5 mV
Lead Channel Sensing Intrinsic Amplitude: 12.5 mV
Lead Channel Setting Pacing Amplitude: 2 V
Lead Channel Setting Pacing Amplitude: 2 V
Lead Channel Setting Pacing Pulse Width: 0.4 ms
Lead Channel Setting Sensing Sensitivity: 2 mV
Zone Setting Status: 755011
Zone Setting Status: 755011

## 2024-02-04 DIAGNOSIS — Z1379 Encounter for other screening for genetic and chromosomal anomalies: Secondary | ICD-10-CM | POA: Diagnosis not present

## 2024-02-09 ENCOUNTER — Encounter: Payer: Self-pay | Admitting: Cardiology

## 2024-02-09 ENCOUNTER — Ambulatory Visit: Attending: Cardiology | Admitting: Cardiology

## 2024-02-09 VITALS — BP 94/52 | HR 84 | Ht 63.0 in | Wt 161.0 lb

## 2024-02-09 DIAGNOSIS — I428 Other cardiomyopathies: Secondary | ICD-10-CM

## 2024-02-09 DIAGNOSIS — I5022 Chronic systolic (congestive) heart failure: Secondary | ICD-10-CM | POA: Diagnosis not present

## 2024-02-09 DIAGNOSIS — Z9889 Other specified postprocedural states: Secondary | ICD-10-CM | POA: Diagnosis not present

## 2024-02-09 NOTE — Patient Instructions (Signed)
 Medication Instructions:  Your physician recommends that you continue on your current medications as directed. Please refer to the Current Medication list given to you today.  *If you need a refill on your cardiac medications before your next appointment, please call your pharmacy*  Lab Work: none If you have labs (blood work) drawn today and your tests are completely normal, you will receive your results only by: MyChart Message (if you have MyChart) OR A paper copy in the mail If you have any lab test that is abnormal or we need to change your treatment, we will call you to review the results.  Testing/Procedures: Your physician has requested that you have an echocardiogram. Echocardiography is a painless test that uses sound waves to create images of your heart. It provides your doctor with information about the size and shape of your heart and how well your heart's chambers and valves are working. This procedure takes approximately one hour. There are no restrictions for this procedure. Please do NOT wear cologne, perfume, aftershave, or lotions (deodorant is allowed). Please arrive 15 minutes prior to your appointment time.  Please note: We ask at that you not bring children with you during ultrasound (echo/ vascular) testing. Due to room size and safety concerns, children are not allowed in the ultrasound rooms during exams. Our front office staff cannot provide observation of children in our lobby area while testing is being conducted. An adult accompanying a patient to their appointment will only be allowed in the ultrasound room at the discretion of the ultrasound technician under special circumstances. We apologize for any inconvenience.   Follow-Up: At [facility], you and your health needs are our priority.  As part of our continuing mission to provide you with exceptional heart care, our providers are all part of one team.  This team includes your primary Cardiologist  (physician) and Advanced Practice Providers or APPs (Physician Assistants and Nurse Practitioners) who all work together to provide you with the care you need, when you need it.  Your next appointment:   12 month(s)  Provider:   Knox Perl, MD    We recommend signing up for the patient portal called "MyChart".  Sign up information is provided on this After Visit Summary.  MyChart is used to connect with patients for Virtual Visits (Telemedicine).  Patients are able to view lab/test results, encounter notes, upcoming appointments, etc.  Non-urgent messages can be sent to your provider as well.   To learn more about what you can do with MyChart, go to [email or web address].   Other Instructions

## 2024-02-09 NOTE — Progress Notes (Signed)
 Cardiology Office Note:  .   Date:  02/09/2024  ID:  Doris [name], DOB 13-Apr-1951, MRN [id number] PCP: Rory Collard, MD  [facility] Providers Cardiologist:  Knox Perl, MD Electrophysiologist:  Richardo Chandler, MD   History of Present Illness: .   Doris [name] is a 73 y.o. Caucasian female patient with mitral valve repair for severe MR and MVP in 2019 at [facility], developed complete heart block needing Medtronic CRT-P placement, however there was return of intrinsic rhythm 3 months later.  She also has hypertension, HFrEF presents to establish care nonischemic cardiomyopathy and management of heart failure.  She is presently resides at [facility] and has advanced dementia and last hospitalization was 12/17/2023 with acute metabolic encephalopathy and UTI.  Patient is new to me.  Discussed the use of AI scribe software for clinical note transcription with the patient, who gave verbal consent to proceed.  History of Present Illness Doris [name] "Doris [name]" is a 73 year old female with mitral valve surgery and pacemaker placement who presents for a follow-up on her heart condition. They are accompanied by their sister, Doris [name], who is her power of attorney.    Doris [name] underwent mitral valve surgery at [facility] in 2019, followed by pacemaker placement due to heart block. Non-ischemic cardiomyopathy with an ejection fraction of 35% since 2019.  She report no heart failure symptoms or hospitalizations since then.  They maintain a moderate level of physical activity, walking to the dining room without stopping. She have gained weight since moving to an assisted living facility, but their clothes fit the same. There is no leg swelling, sleep disturbances, palpitations, or chest pain.  Their medication regimen includes bisoprolol , Jardiance  10 mg once daily, and spironolactone . Jardiance  was added during a hospitalization for a UTI with altered mental  status in March 2025.   Labs   Lab Results  Component Value Date   NA 131 (L) 12/16/2023   K 3.9 12/16/2023   CO2 22 12/16/2023   GLUCOSE 93 12/16/2023   BUN 12 12/16/2023   CREATININE 0.68 12/16/2023   CALCIUM 9.1 12/16/2023   GFRNONAA >60 12/16/2023      Latest Ref Rng & Units 12/16/2023    4:43 AM 12/15/2023   12:19 PM 12/15/2023   10:20 AM  BMP  Glucose 70 - 99 mg/dL 93  409  811   BUN 8 - 23 mg/dL 12  12  10    Creatinine 0.44 - 1.00 mg/dL 9.14  7.82  9.56   Sodium 135 - 145 mmol/L 131  128  130   Potassium 3.5 - 5.1 mmol/L 3.9  4.1  4.0   Chloride 98 - 111 mmol/L 101  100  98   CO2 22 - 32 mmol/L 22  22  22    Calcium 8.9 - 10.3 mg/dL 9.1  8.9  9.0       Latest Ref Rng & Units 12/16/2023    4:43 AM 12/15/2023    7:23 AM 12/14/2023    5:37 PM  CBC  WBC 4.0 - 10.5 K/uL 5.2  5.3  8.9   Hemoglobin 12.0 - 15.0 g/dL 21.3  08.6  57.8   Hematocrit 36.0 - 46.0 % 41.5  42.7  37.2   Platelets 150 - 400 K/uL 249  275  243    No results found for: "HGBA1C"  Lab Results  Component Value Date   TSH 1.605 12/15/2023    ROS  Review of Systems  Cardiovascular:  Negative for chest pain, dyspnea on exertion and leg swelling.   Physical Exam:   VS:  BP (!) 94/52   Pulse 84   Ht 5\' 3"  (1.6 m)   Wt 161 lb (73 kg)   SpO2 96%   BMI 28.52 kg/m    Wt Readings from Last 3 Encounters:  02/09/24 161 lb (73 kg)  12/15/23 147 lb 14.9 oz (67.1 kg)  07/15/23 151 lb 12.8 oz (68.9 kg)    Physical Exam Neck:     Vascular: No carotid bruit or JVD.  Cardiovascular:     Rate and Rhythm: Normal rate and regular rhythm.     Pulses: Intact distal pulses.     Heart sounds: Normal heart sounds. No murmur heard.    No gallop.  Pulmonary:     Effort: Pulmonary effort is normal.     Breath sounds: Normal breath sounds.  Abdominal:     General: Bowel sounds are normal.     Palpations: Abdomen is soft.  Musculoskeletal:     Right lower leg: No edema.     Left lower leg: No edema.     Studies Reviewed: Aaron Aas    Care everywhere echocardiogram 03/11/2022: Moderate LV systolic dysfunction EF 35%, grade 2 diastolic dysfunction. No significant valvular abnormality.  SB mitral valve repair with 32 mm Simulus ring.  Remote pacemaker transmission 02/03/2024: Presenting a paced V sensed rhythm.  2 NSVT episodes, >20 beats on 01/14/2024 and 7 beat NSVT. Thoracic impedance is at baseline and does not suggest volume overload state. Longevity 108 months. EKG:         EKG 12/15/2023: Atrially paced and ventricularly sensed rhythm at rate of 63 bpm, normal axis, incomplete right bundle branch block.  Nonspecific T abnormality.  Medications and allergies    Allergies  Allergen Reactions   Benzocaine Other (See Comments)    Peripheral Neuropathy (Med tech "Shelvy Dickens C." @ [facility] had no record of this)   Carboxymethylcellulose Swelling and Other (See Comments)     (Med tech "Shelvy Dickens C." @ [facility] had no record of this)   Clarithromycin Other (See Comments)    PERIPHERAL NEUROPATHY   (Med tech "Shelvy Dickens C." @ [facility] had no record of this)   Doxycycline Other (See Comments)    Stomach upset and Neuropathy (Med tech "Shelvy Dickens C." @ [facility] had no record of this)   Duloxetine Other (See Comments)    Headache  (Med tech "Shelvy Dickens C." @ [facility] had no record of this)   Fentanyl Other (See Comments)    Suicidal thoughts  (Med tech "Angela C." @ [facility] had no record of this)   Lidocaine Other (See Comments)    UNABLE TO SPEAK, Dr thinks he hit a blood vessel (Med tech "Shelvy Dickens C." @ [employer] had no record of this)   Metronidazole Other (See Comments)    PERIPHERAL NEUROPATHY (Med tech "Shelvy Dickens C." @ [facility] had no record of this)   Monosodium Glutamate Other (See Comments)    (Med tech "Angela C." @ [facility] had no record of this)   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 Take 500 mg by mouth two times a day  and an additional 500 mg every 6 hours as needed for pain or headaches, Disp: , Rfl:    alendronate (FOSAMAX) 70 MG tablet, Take 70 mg by mouth every Monday., Disp: , Rfl:    B Complex-C (B-COMPLEX WITH VITAMIN C) tablet, Take 1 tablet by mouth daily., Disp: , Rfl:    bisoprolol  (ZEBETA ) 5 MG tablet, TAKE 1/2 TABLET BY MOUTH DAILY (Patient taking differently: Take 2.5 mg by mouth in the morning.), Disp: 45 tablet, Rfl: 3   Calcium Carb-Cholecalciferol (CALCIUM 600+D3 PO), Take 1 tablet by mouth 2 (two) times daily with a meal., Disp: , Rfl:    diclofenac Sodium (VOLTAREN) 1 % GEL, Apply 2 g topically See admin instructions. Apply 2 grams topically to the neck and back in the morning and at bedtime, Disp: , Rfl:    donepezil  (ARICEPT ) 10 MG tablet, Take 10 mg by mouth at bedtime., Disp: , Rfl:    empagliflozin  (JARDIANCE ) 10 MG TABS tablet, Take 1 tablet (10 mg total) by mouth daily before breakfast., Disp: 30 tablet, Rfl: 6   gabapentin  (NEURONTIN ) 300 MG capsule, Take 600 mg by mouth at bedtime., Disp: , Rfl:    Krill Oil 350 MG CAPS, Take 350 mg by mouth daily with breakfast., Disp: , Rfl:    Menaquinone-7 (VITAMIN K2) 100 MCG CAPS, Take 100 mcg by mouth daily., Disp: , Rfl:    Multiple Vitamins-Minerals (PRESERVISION/LUTEIN) CAPS, Take 1 capsule by mouth 2 (two) times daily., Disp: , Rfl:    naproxen (NAPROSYN) 500 MG tablet, Take 500 mg by mouth 2 (two) times daily with a meal., Disp: , Rfl:    nystatin (MYCOSTATIN/NYSTOP) powder, Apply 1 Application topically 2 (two) times daily., Disp: , Rfl:    polyethylene glycol powder (GLYCOLAX /MIRALAX ) 17 GM/SCOOP powder, Take 17 g by mouth 2 (two) times daily as needed for mild constipation (to be mixed into 6 ounces of water).,  Disp: , Rfl:    spironolactone  (ALDACTONE ) 25 MG tablet, TAKE 1/2 TABLET BY MOUTH DAILY (Patient taking differently: Take 25 mg by mouth daily.), Disp: 45 tablet, Rfl: 1   traMADol  (ULTRAM ) 50 MG tablet, Take 1 tablet (50 mg total) by mouth daily as needed (for pain)., Disp: 10 tablet, Rfl: 0   No orders of the defined types were placed in this encounter.    There are no discontinued medications.   ASSESSMENT AND PLAN: .      ICD-10-CM   1. Non-ischemic cardiomyopathy (HCC)  I42.8 ECHOCARDIOGRAM COMPLETE    2. Chronic heart failure with reduced ejection fraction (HFrEF, <= 40%) (HCC)  I50.22 ECHOCARDIOGRAM COMPLETE    3. S/P MVR (mitral valve repair) with 32 mm Simulus ring 2019  Z98.890 ECHOCARDIOGRAM COMPLETE      Assessment and Plan Assessment & Plan Non-ischemic cardiomyopathy   Chronic non-ischemic cardiomyopathy with an ejection fraction of 35% since 2019 is well-managed, with no recent heart failure symptoms or hospitalizations.  Current medications include bisoprolol , Jardiance , and spironolactone .  The decision to maintain current medications is based on the stability of the condition over the past six years without acute heart failure exacerbations. Order an echocardiogram to monitor heart function.  Instruct them to weigh daily and report a weight gain of 3-4 pounds over 3 days. Advise them to call if experiencing dyspnea.  Heart failure   Heart failure with reduced ejection fraction (HFrEF) with an ejection fraction of 35% has been well-managed since 2019, with no recent exacerbations or hospitalizations. Current medications are appropriate for  heart failure management.  Mitral valve surgery   Mitral valve repair with a ring in 2019 is functioning normally with no leakage detected on auscultation. The surgical repair is considered successful.  Pacemaker placement   Pacemaker placed post-mitral valve surgery due to heart block. Currently, the upper chamber of the heart  is using the pacemaker, but the lower chamber is not.  I reviewed her pacemaker transmission, from 02/03/2024 revealing no volume overload state and normal function.  Goals of Care   Discussion about the use of a walker or cane to prevent falls. They are resistant to using assistive devices despite a recent fall. Encourage her to consider using them for safety and improved mobility.  Follow-up   Follow-up plan discussed to monitor heart function and overall health. Schedule follow-up in one year. Instruct them to call if experiencing symptoms of heart failure or significant weight gain.   Signed,  Knox Perl, MD, [facility] 02/09/2024, 5:01 PM [facility] [address] [place], [place] [address] Phone: [phone]. Fax:  [phone]

## 2024-02-10 ENCOUNTER — Ambulatory Visit: Payer: Self-pay | Admitting: Cardiology

## 2024-02-18 DIAGNOSIS — I110 Hypertensive heart disease with heart failure: Secondary | ICD-10-CM | POA: Diagnosis not present

## 2024-02-18 DIAGNOSIS — D519 Vitamin B12 deficiency anemia, unspecified: Secondary | ICD-10-CM | POA: Diagnosis not present

## 2024-02-19 DIAGNOSIS — Q763 Congenital scoliosis due to congenital bony malformation: Secondary | ICD-10-CM | POA: Diagnosis not present

## 2024-02-19 DIAGNOSIS — R102 Pelvic and perineal pain: Secondary | ICD-10-CM | POA: Diagnosis not present

## 2024-02-19 DIAGNOSIS — G894 Chronic pain syndrome: Secondary | ICD-10-CM | POA: Diagnosis not present

## 2024-02-26 DIAGNOSIS — I110 Hypertensive heart disease with heart failure: Secondary | ICD-10-CM | POA: Diagnosis not present

## 2024-02-26 DIAGNOSIS — F0390 Unspecified dementia without behavioral disturbance: Secondary | ICD-10-CM | POA: Diagnosis not present

## 2024-02-26 DIAGNOSIS — M810 Age-related osteoporosis without current pathological fracture: Secondary | ICD-10-CM | POA: Diagnosis not present

## 2024-03-17 DIAGNOSIS — I5022 Chronic systolic (congestive) heart failure: Secondary | ICD-10-CM | POA: Diagnosis not present

## 2024-03-17 DIAGNOSIS — E785 Hyperlipidemia, unspecified: Secondary | ICD-10-CM | POA: Diagnosis not present

## 2024-03-18 DIAGNOSIS — Q763 Congenital scoliosis due to congenital bony malformation: Secondary | ICD-10-CM | POA: Diagnosis not present

## 2024-03-18 DIAGNOSIS — G894 Chronic pain syndrome: Secondary | ICD-10-CM | POA: Diagnosis not present

## 2024-03-18 DIAGNOSIS — R102 Pelvic and perineal pain: Secondary | ICD-10-CM | POA: Diagnosis not present

## 2024-03-21 NOTE — Progress Notes (Signed)
 Remote pacemaker transmission.

## 2024-03-21 NOTE — Addendum Note (Signed)
 Addended by: VICCI SELLER A on: 03/21/2024 10:05 AM   Modules accepted: Orders

## 2024-03-22 ENCOUNTER — Ambulatory Visit: Payer: Self-pay | Admitting: Cardiology

## 2024-03-22 ENCOUNTER — Ambulatory Visit (HOSPITAL_COMMUNITY)
Admission: RE | Admit: 2024-03-22 | Discharge: 2024-03-22 | Disposition: A | Discharge status: Home / Self Care | Source: Ambulatory Visit | Attending: Cardiology | Admitting: Cardiology

## 2024-03-22 DIAGNOSIS — Z9889 Other specified postprocedural states: Secondary | ICD-10-CM | POA: Diagnosis not present

## 2024-03-22 DIAGNOSIS — I5022 Chronic systolic (congestive) heart failure: Secondary | ICD-10-CM

## 2024-03-22 DIAGNOSIS — I428 Other cardiomyopathies: Secondary | ICD-10-CM

## 2024-03-22 LAB — ECHOCARDIOGRAM COMPLETE
Area-P 1/2: 1.61 cm2
MV VTI: 1.33 cm2
P 1/2 time: 484 ms
S' Lateral: 4.46 cm

## 2024-03-22 NOTE — Progress Notes (Signed)
 Echocardiogram reveals very stable LVEF, may have slightly improved from [facility] echocardiogram where the EF is reported at 35% and present EF is 35 to 40%. Mitral valve repair appears to be good there is mild to moderate aortic regurgitation which is very similar to echocardiogram done on 03/11/2022.  Overall stable echocardiogram.

## 2024-04-01 DIAGNOSIS — I110 Hypertensive heart disease with heart failure: Secondary | ICD-10-CM | POA: Diagnosis not present

## 2024-04-01 DIAGNOSIS — F0390 Unspecified dementia without behavioral disturbance: Secondary | ICD-10-CM | POA: Diagnosis not present

## 2024-04-01 DIAGNOSIS — I5022 Chronic systolic (congestive) heart failure: Secondary | ICD-10-CM | POA: Diagnosis not present

## 2024-04-14 ENCOUNTER — Encounter: Payer: Self-pay | Admitting: Cardiovascular Disease

## 2024-04-14 ENCOUNTER — Ambulatory Visit: Attending: Cardiovascular Disease | Admitting: Cardiovascular Disease

## 2024-04-14 VITALS — BP 126/77 | HR 60 | Ht 63.0 in | Wt 154.0 lb

## 2024-04-14 DIAGNOSIS — I5022 Chronic systolic (congestive) heart failure: Secondary | ICD-10-CM

## 2024-04-14 DIAGNOSIS — M1991 Primary osteoarthritis, unspecified site: Secondary | ICD-10-CM | POA: Diagnosis not present

## 2024-04-14 DIAGNOSIS — I428 Other cardiomyopathies: Secondary | ICD-10-CM

## 2024-04-14 DIAGNOSIS — I442 Atrioventricular block, complete: Secondary | ICD-10-CM

## 2024-04-14 DIAGNOSIS — Z950 Presence of cardiac pacemaker: Secondary | ICD-10-CM | POA: Diagnosis not present

## 2024-04-14 DIAGNOSIS — N189 Chronic kidney disease, unspecified: Secondary | ICD-10-CM | POA: Diagnosis not present

## 2024-04-14 DIAGNOSIS — I4719 Other supraventricular tachycardia: Secondary | ICD-10-CM

## 2024-04-14 NOTE — Progress Notes (Signed)
 Electrophysiology Office Note:    Date:  04/14/2024   ID:  Doris [name], DOB 17-Jun-1951, MRN [id number]  PCP:  Glover Lenis, MD   [facility] Providers Cardiologist:  Gordy Bergamo, MD Electrophysiologist:  Elspeth Sage, MD     Referring MD: Glover Lenis, MD   History of Present Illness:    Doris [name] is a 73 y.o. female with a medical history significant for mitral valve repair for severe MR and mitral valve prolapse, complete heart block with Medtronic CRT-P (with recovered conduction), CHFrEF, advanced dementia referred for device follow-up.      Discussed the use of AI scribe software for clinical note transcription with the patient, who gave verbal consent to proceed.  History of Present Illness Doris [name] is a 73 year old female with severe mitral regurgitation and mitral valve prolapse who presents for follow-up of her cardiac device and heart rhythm management.  She has a history of severe mitral regurgitation and mitral valve prolapse, for which she underwent mitral valve repair in 2019 at [facility]. Post-surgery, she developed a complete heart block and had a Medtronic CRTP device implanted. Her intrinsic rhythm returned approximately three months post-surgery with recovered conduction, although she still requires atrial pacing occasionally.  The CRTP device currently shows atrial pacing at 24% and ventricular pacing at 3.6%. The left ventricular lead is programmed off. The battery life of the device is expected to last about eight years.  She has a history of congestive heart failure with moderately reduced ejection fraction and is intolerant to ACE inhibitors and ARBs. She also has a history of junctional tachycardia and experiences occasional non-sustained ventricular tachycardia (NSVT), which she does not typically feel.         Today, she reports that she is doing well and has no complaints. she has no device related complaints  -- no new tenderness, drainage, redness.   EKGs/Labs/Other Studies Reviewed Today:     Echocardiogram:  TTE July 2025 LVEF 35 to 40%.  Global hypokinesis.  Mildly reduced RV systolic function.  Moderately dilated left atrium.  The mitral valve has been repaired.  There is trivial mitral valve regurgitation.     EKG:     Personally interpreted by me. A-paced rhythm. ECG not saving in Epic at present.    Physical Exam:    VS:  BP 126/77 (BP Location: Left Arm, Patient Position: Sitting, Cuff Size: Normal)   Pulse 60   Ht 5' 3 (1.6 m)   Wt 154 lb (69.9 kg)   SpO2 99%   BMI 27.28 kg/m     Wt Readings from Last 3 Encounters:  04/14/24 154 lb (69.9 kg)  02/09/24 161 lb (73 kg)  12/15/23 147 lb 14.9 oz (67.1 kg)     GEN: Well nourished, well developed in no acute distress CARDIAC: RRR, no murmurs, rubs, gallops RESPIRATORY:  Normal work of breathing MUSCULOSKELETAL: no edema    ASSESSMENT & PLAN:     Complete heart block, sinus bradycardia Complication of mitral valve repair in 2019 Conduction recovered She still receives atrial pacing -24% atrial pacing today She is not device dependent I reviewed today's device interrogation.  See Paceart for details. Device is functioning normally  Medtronic CRT-P LV lead is programmed off  CHF with mildly reduced ejection fraction Intolerant to ACE ARB's  History of junctional tachycardia, nonsustained VT Will monitor with device    Signed, Eulas FORBES Furbish, MD  04/14/2024 10:32 AM  [facility]

## 2024-04-14 NOTE — Patient Instructions (Signed)
 Medication Instructions:  Your physician recommends that you continue on your current medications as directed. Please refer to the Current Medication list given to you today.  *If you need a refill on your cardiac medications before your next appointment, please call your pharmacy*  Lab Work: None ordered.  You may go to any [facility] Location for your lab work:  [facility] - [address] ([facility]) - [address] [phone] [address]  [place] - [address]   [place]  - [address]   [place] - [address] - [address] [employer]  - [address] [place] - [address] ([facility]   If you have labs (blood work) drawn today and your tests are completely normal, you will receive your results only by: [employer] (if you have MyChart)  If you have any lab test that is abnormal or we need to change your treatment, we will call you or send a MyChart message to review the results.  Testing/Procedures: None ordered.  Follow-Up: At [facility], you and your health needs are our priority.  As part of our continuing mission to provide you with exceptional heart care, we have created designated Provider Care Teams.  These Care Teams include your primary Cardiologist (physician) and Advanced Practice Providers (APPs -  Physician Assistants and Nurse Practitioners) who all work together to provide you with the care you need, when you need it.  Your next appointment:   1 year(s)  The format for your next appointment:   In Person  Provider:   Eulas Furbish, MD or one of the following Advanced Practice Providers on your designated Care Team:   Charlies Arthur, [place] Ozell Jodie Passey, [place] Leotis Barrack, NP  Note: Remote monitoring is used to monitor your Pacemaker/ ICD from home. This monitoring reduces the number of office visits required to check  your device to one time per year. It allows us  to keep an eye on the functioning of your device to ensure it is working properly.

## 2024-04-15 DIAGNOSIS — R102 Pelvic and perineal pain: Secondary | ICD-10-CM | POA: Diagnosis not present

## 2024-04-15 DIAGNOSIS — Q763 Congenital scoliosis due to congenital bony malformation: Secondary | ICD-10-CM | POA: Diagnosis not present

## 2024-04-15 DIAGNOSIS — G894 Chronic pain syndrome: Secondary | ICD-10-CM | POA: Diagnosis not present

## 2024-04-17 DIAGNOSIS — N390 Urinary tract infection, site not specified: Secondary | ICD-10-CM | POA: Diagnosis not present

## 2024-04-25 ENCOUNTER — Other Ambulatory Visit (HOSPITAL_COMMUNITY): Payer: Self-pay

## 2024-05-02 DIAGNOSIS — Z7983 Long term (current) use of bisphosphonates: Secondary | ICD-10-CM | POA: Diagnosis not present

## 2024-05-02 DIAGNOSIS — M810 Age-related osteoporosis without current pathological fracture: Secondary | ICD-10-CM | POA: Diagnosis not present

## 2024-05-02 DIAGNOSIS — G894 Chronic pain syndrome: Secondary | ICD-10-CM | POA: Diagnosis not present

## 2024-05-02 DIAGNOSIS — E539 Vitamin B deficiency, unspecified: Secondary | ICD-10-CM | POA: Diagnosis not present

## 2024-05-02 DIAGNOSIS — M1991 Primary osteoarthritis, unspecified site: Secondary | ICD-10-CM | POA: Diagnosis not present

## 2024-05-02 DIAGNOSIS — I509 Heart failure, unspecified: Secondary | ICD-10-CM | POA: Diagnosis not present

## 2024-05-02 DIAGNOSIS — N1831 Chronic kidney disease, stage 3a: Secondary | ICD-10-CM | POA: Diagnosis not present

## 2024-05-02 DIAGNOSIS — E782 Mixed hyperlipidemia: Secondary | ICD-10-CM | POA: Diagnosis not present

## 2024-05-02 DIAGNOSIS — I130 Hypertensive heart and chronic kidney disease with heart failure and stage 1 through stage 4 chronic kidney disease, or unspecified chronic kidney disease: Secondary | ICD-10-CM | POA: Diagnosis not present

## 2024-05-02 DIAGNOSIS — E559 Vitamin D deficiency, unspecified: Secondary | ICD-10-CM | POA: Diagnosis not present

## 2024-05-03 ENCOUNTER — Ambulatory Visit: Payer: Medicare HMO

## 2024-05-03 DIAGNOSIS — I442 Atrioventricular block, complete: Secondary | ICD-10-CM

## 2024-05-04 ENCOUNTER — Ambulatory Visit: Payer: Self-pay | Admitting: Cardiology

## 2024-05-04 LAB — CUP PACEART REMOTE DEVICE CHECK
Battery Remaining Longevity: 104 mo
Battery Voltage: 2.99 V
Brady Statistic AP VP Percent: 0.05 %
Brady Statistic AP VS Percent: 10.61 %
Brady Statistic AS VP Percent: 0.02 %
Brady Statistic AS VS Percent: 89.31 %
Brady Statistic RA Percent Paced: 11.04 %
Brady Statistic RV Percent Paced: 0.07 %
Date Time Interrogation Session: 20250812011135
Implantable Lead Connection Status: 753985
Implantable Lead Connection Status: 753985
Implantable Lead Connection Status: 753985
Implantable Lead Implant Date: 20190805
Implantable Lead Implant Date: 20190805
Implantable Lead Implant Date: 20190805
Implantable Lead Location: 753858
Implantable Lead Location: 753859
Implantable Lead Location: 753860
Implantable Lead Model: 4398
Implantable Lead Model: 5076
Implantable Lead Model: 5076
Implantable Lead Serial Number: 11111
Implantable Pulse Generator Implant Date: 20190805
Lead Channel Impedance Value: 1064 Ohm
Lead Channel Impedance Value: 1083 Ohm
Lead Channel Impedance Value: 342 Ohm
Lead Channel Impedance Value: 399 Ohm
Lead Channel Impedance Value: 418 Ohm
Lead Channel Impedance Value: 456 Ohm
Lead Channel Impedance Value: 551 Ohm
Lead Channel Impedance Value: 589 Ohm
Lead Channel Impedance Value: 608 Ohm
Lead Channel Impedance Value: 627 Ohm
Lead Channel Impedance Value: 741 Ohm
Lead Channel Impedance Value: 836 Ohm
Lead Channel Impedance Value: 874 Ohm
Lead Channel Impedance Value: 874 Ohm
Lead Channel Pacing Threshold Amplitude: 0.625 V
Lead Channel Pacing Threshold Amplitude: 1.125 V
Lead Channel Pacing Threshold Amplitude: 1.125 V
Lead Channel Pacing Threshold Pulse Width: 0.4 ms
Lead Channel Pacing Threshold Pulse Width: 0.4 ms
Lead Channel Pacing Threshold Pulse Width: 0.4 ms
Lead Channel Sensing Intrinsic Amplitude: 0.875 mV
Lead Channel Sensing Intrinsic Amplitude: 0.875 mV
Lead Channel Sensing Intrinsic Amplitude: 14.125 mV
Lead Channel Sensing Intrinsic Amplitude: 14.125 mV
Lead Channel Setting Pacing Amplitude: 2 V
Lead Channel Setting Pacing Amplitude: 2.25 V
Lead Channel Setting Pacing Pulse Width: 0.4 ms
Lead Channel Setting Sensing Sensitivity: 2 mV
Zone Setting Status: 755011
Zone Setting Status: 755011

## 2024-05-06 DIAGNOSIS — N1831 Chronic kidney disease, stage 3a: Secondary | ICD-10-CM | POA: Diagnosis not present

## 2024-05-06 DIAGNOSIS — I130 Hypertensive heart and chronic kidney disease with heart failure and stage 1 through stage 4 chronic kidney disease, or unspecified chronic kidney disease: Secondary | ICD-10-CM | POA: Diagnosis not present

## 2024-05-06 DIAGNOSIS — M1991 Primary osteoarthritis, unspecified site: Secondary | ICD-10-CM | POA: Diagnosis not present

## 2024-05-06 DIAGNOSIS — F0390 Unspecified dementia without behavioral disturbance: Secondary | ICD-10-CM | POA: Diagnosis not present

## 2024-05-06 DIAGNOSIS — Z7983 Long term (current) use of bisphosphonates: Secondary | ICD-10-CM | POA: Diagnosis not present

## 2024-05-20 DIAGNOSIS — G894 Chronic pain syndrome: Secondary | ICD-10-CM | POA: Diagnosis not present

## 2024-05-20 DIAGNOSIS — R102 Pelvic and perineal pain: Secondary | ICD-10-CM | POA: Diagnosis not present

## 2024-05-20 DIAGNOSIS — Q763 Congenital scoliosis due to congenital bony malformation: Secondary | ICD-10-CM | POA: Diagnosis not present

## 2024-05-21 DIAGNOSIS — N189 Chronic kidney disease, unspecified: Secondary | ICD-10-CM | POA: Diagnosis not present

## 2024-05-21 DIAGNOSIS — M1991 Primary osteoarthritis, unspecified site: Secondary | ICD-10-CM | POA: Diagnosis not present

## 2024-05-27 DIAGNOSIS — M810 Age-related osteoporosis without current pathological fracture: Secondary | ICD-10-CM | POA: Diagnosis not present

## 2024-05-27 DIAGNOSIS — F0390 Unspecified dementia without behavioral disturbance: Secondary | ICD-10-CM | POA: Diagnosis not present

## 2024-05-27 DIAGNOSIS — I130 Hypertensive heart and chronic kidney disease with heart failure and stage 1 through stage 4 chronic kidney disease, or unspecified chronic kidney disease: Secondary | ICD-10-CM | POA: Diagnosis not present

## 2024-05-27 DIAGNOSIS — Z7983 Long term (current) use of bisphosphonates: Secondary | ICD-10-CM | POA: Diagnosis not present

## 2024-05-27 DIAGNOSIS — N1831 Chronic kidney disease, stage 3a: Secondary | ICD-10-CM | POA: Diagnosis not present

## 2024-05-31 DIAGNOSIS — F0280 Dementia in other diseases classified elsewhere without behavioral disturbance: Secondary | ICD-10-CM | POA: Diagnosis not present

## 2024-05-31 DIAGNOSIS — M150 Primary generalized (osteo)arthritis: Secondary | ICD-10-CM | POA: Diagnosis not present

## 2024-05-31 DIAGNOSIS — I509 Heart failure, unspecified: Secondary | ICD-10-CM | POA: Diagnosis not present

## 2024-06-02 DIAGNOSIS — M6281 Muscle weakness (generalized): Secondary | ICD-10-CM | POA: Diagnosis not present

## 2024-06-02 DIAGNOSIS — M25511 Pain in right shoulder: Secondary | ICD-10-CM | POA: Diagnosis not present

## 2024-06-02 DIAGNOSIS — F0390 Unspecified dementia without behavioral disturbance: Secondary | ICD-10-CM | POA: Diagnosis not present

## 2024-06-03 DIAGNOSIS — I509 Heart failure, unspecified: Secondary | ICD-10-CM | POA: Diagnosis not present

## 2024-06-03 DIAGNOSIS — F0280 Dementia in other diseases classified elsewhere without behavioral disturbance: Secondary | ICD-10-CM | POA: Diagnosis not present

## 2024-06-03 DIAGNOSIS — M150 Primary generalized (osteo)arthritis: Secondary | ICD-10-CM | POA: Diagnosis not present

## 2024-06-06 DIAGNOSIS — I509 Heart failure, unspecified: Secondary | ICD-10-CM | POA: Diagnosis not present

## 2024-06-06 DIAGNOSIS — M25511 Pain in right shoulder: Secondary | ICD-10-CM | POA: Diagnosis not present

## 2024-06-06 DIAGNOSIS — F0280 Dementia in other diseases classified elsewhere without behavioral disturbance: Secondary | ICD-10-CM | POA: Diagnosis not present

## 2024-06-06 DIAGNOSIS — F0390 Unspecified dementia without behavioral disturbance: Secondary | ICD-10-CM | POA: Diagnosis not present

## 2024-06-06 DIAGNOSIS — M150 Primary generalized (osteo)arthritis: Secondary | ICD-10-CM | POA: Diagnosis not present

## 2024-06-07 DIAGNOSIS — M25511 Pain in right shoulder: Secondary | ICD-10-CM | POA: Diagnosis not present

## 2024-06-07 DIAGNOSIS — M6281 Muscle weakness (generalized): Secondary | ICD-10-CM | POA: Diagnosis not present

## 2024-06-07 DIAGNOSIS — F0390 Unspecified dementia without behavioral disturbance: Secondary | ICD-10-CM | POA: Diagnosis not present

## 2024-06-08 DIAGNOSIS — F0280 Dementia in other diseases classified elsewhere without behavioral disturbance: Secondary | ICD-10-CM | POA: Diagnosis not present

## 2024-06-08 DIAGNOSIS — M150 Primary generalized (osteo)arthritis: Secondary | ICD-10-CM | POA: Diagnosis not present

## 2024-06-08 DIAGNOSIS — M25511 Pain in right shoulder: Secondary | ICD-10-CM | POA: Diagnosis not present

## 2024-06-08 DIAGNOSIS — M6281 Muscle weakness (generalized): Secondary | ICD-10-CM | POA: Diagnosis not present

## 2024-06-08 DIAGNOSIS — I509 Heart failure, unspecified: Secondary | ICD-10-CM | POA: Diagnosis not present

## 2024-06-08 DIAGNOSIS — F0390 Unspecified dementia without behavioral disturbance: Secondary | ICD-10-CM | POA: Diagnosis not present

## 2024-06-09 DIAGNOSIS — M25511 Pain in right shoulder: Secondary | ICD-10-CM | POA: Diagnosis not present

## 2024-06-09 DIAGNOSIS — F0390 Unspecified dementia without behavioral disturbance: Secondary | ICD-10-CM | POA: Diagnosis not present

## 2024-06-13 DIAGNOSIS — I509 Heart failure, unspecified: Secondary | ICD-10-CM | POA: Diagnosis not present

## 2024-06-13 DIAGNOSIS — F0390 Unspecified dementia without behavioral disturbance: Secondary | ICD-10-CM | POA: Diagnosis not present

## 2024-06-13 DIAGNOSIS — M6281 Muscle weakness (generalized): Secondary | ICD-10-CM | POA: Diagnosis not present

## 2024-06-13 DIAGNOSIS — F0280 Dementia in other diseases classified elsewhere without behavioral disturbance: Secondary | ICD-10-CM | POA: Diagnosis not present

## 2024-06-13 DIAGNOSIS — M150 Primary generalized (osteo)arthritis: Secondary | ICD-10-CM | POA: Diagnosis not present

## 2024-06-13 DIAGNOSIS — M25511 Pain in right shoulder: Secondary | ICD-10-CM | POA: Diagnosis not present

## 2024-06-14 DIAGNOSIS — F0390 Unspecified dementia without behavioral disturbance: Secondary | ICD-10-CM | POA: Diagnosis not present

## 2024-06-14 DIAGNOSIS — M25511 Pain in right shoulder: Secondary | ICD-10-CM | POA: Diagnosis not present

## 2024-06-15 DIAGNOSIS — F0390 Unspecified dementia without behavioral disturbance: Secondary | ICD-10-CM | POA: Diagnosis not present

## 2024-06-15 DIAGNOSIS — R531 Weakness: Secondary | ICD-10-CM | POA: Diagnosis not present

## 2024-06-15 DIAGNOSIS — I509 Heart failure, unspecified: Secondary | ICD-10-CM | POA: Diagnosis not present

## 2024-06-15 DIAGNOSIS — M25511 Pain in right shoulder: Secondary | ICD-10-CM | POA: Diagnosis not present

## 2024-06-15 DIAGNOSIS — F0280 Dementia in other diseases classified elsewhere without behavioral disturbance: Secondary | ICD-10-CM | POA: Diagnosis not present

## 2024-06-15 DIAGNOSIS — M150 Primary generalized (osteo)arthritis: Secondary | ICD-10-CM | POA: Diagnosis not present

## 2024-06-16 DIAGNOSIS — N189 Chronic kidney disease, unspecified: Secondary | ICD-10-CM | POA: Diagnosis not present

## 2024-06-16 DIAGNOSIS — M1991 Primary osteoarthritis, unspecified site: Secondary | ICD-10-CM | POA: Diagnosis not present

## 2024-06-16 NOTE — Progress Notes (Signed)
Remote ICD Transmission.

## 2024-06-17 DIAGNOSIS — R102 Pelvic and perineal pain: Secondary | ICD-10-CM | POA: Diagnosis not present

## 2024-06-17 DIAGNOSIS — G894 Chronic pain syndrome: Secondary | ICD-10-CM | POA: Diagnosis not present

## 2024-06-17 DIAGNOSIS — Q763 Congenital scoliosis due to congenital bony malformation: Secondary | ICD-10-CM | POA: Diagnosis not present

## 2024-06-20 DIAGNOSIS — F0280 Dementia in other diseases classified elsewhere without behavioral disturbance: Secondary | ICD-10-CM | POA: Diagnosis not present

## 2024-06-20 DIAGNOSIS — M150 Primary generalized (osteo)arthritis: Secondary | ICD-10-CM | POA: Diagnosis not present

## 2024-06-20 DIAGNOSIS — I509 Heart failure, unspecified: Secondary | ICD-10-CM | POA: Diagnosis not present

## 2024-06-21 DIAGNOSIS — F0280 Dementia in other diseases classified elsewhere without behavioral disturbance: Secondary | ICD-10-CM | POA: Diagnosis not present

## 2024-06-21 DIAGNOSIS — M25511 Pain in right shoulder: Secondary | ICD-10-CM | POA: Diagnosis not present

## 2024-06-21 DIAGNOSIS — M150 Primary generalized (osteo)arthritis: Secondary | ICD-10-CM | POA: Diagnosis not present

## 2024-06-21 DIAGNOSIS — F0390 Unspecified dementia without behavioral disturbance: Secondary | ICD-10-CM | POA: Diagnosis not present

## 2024-06-23 DIAGNOSIS — M25511 Pain in right shoulder: Secondary | ICD-10-CM | POA: Diagnosis not present

## 2024-06-23 DIAGNOSIS — F0390 Unspecified dementia without behavioral disturbance: Secondary | ICD-10-CM | POA: Diagnosis not present

## 2024-06-23 DIAGNOSIS — R531 Weakness: Secondary | ICD-10-CM | POA: Diagnosis not present

## 2024-06-27 DIAGNOSIS — I509 Heart failure, unspecified: Secondary | ICD-10-CM | POA: Diagnosis not present

## 2024-06-27 DIAGNOSIS — F0280 Dementia in other diseases classified elsewhere without behavioral disturbance: Secondary | ICD-10-CM | POA: Diagnosis not present

## 2024-06-27 DIAGNOSIS — M150 Primary generalized (osteo)arthritis: Secondary | ICD-10-CM | POA: Diagnosis not present

## 2024-06-28 DIAGNOSIS — R531 Weakness: Secondary | ICD-10-CM | POA: Diagnosis not present

## 2024-06-28 DIAGNOSIS — F0390 Unspecified dementia without behavioral disturbance: Secondary | ICD-10-CM | POA: Diagnosis not present

## 2024-06-28 DIAGNOSIS — M25511 Pain in right shoulder: Secondary | ICD-10-CM | POA: Diagnosis not present

## 2024-06-29 DIAGNOSIS — R531 Weakness: Secondary | ICD-10-CM | POA: Diagnosis not present

## 2024-06-29 DIAGNOSIS — F0390 Unspecified dementia without behavioral disturbance: Secondary | ICD-10-CM | POA: Diagnosis not present

## 2024-06-29 DIAGNOSIS — M25511 Pain in right shoulder: Secondary | ICD-10-CM | POA: Diagnosis not present

## 2024-06-30 DIAGNOSIS — F0280 Dementia in other diseases classified elsewhere without behavioral disturbance: Secondary | ICD-10-CM | POA: Diagnosis not present

## 2024-06-30 DIAGNOSIS — M150 Primary generalized (osteo)arthritis: Secondary | ICD-10-CM | POA: Diagnosis not present

## 2024-06-30 DIAGNOSIS — I509 Heart failure, unspecified: Secondary | ICD-10-CM | POA: Diagnosis not present

## 2024-07-01 DIAGNOSIS — I130 Hypertensive heart and chronic kidney disease with heart failure and stage 1 through stage 4 chronic kidney disease, or unspecified chronic kidney disease: Secondary | ICD-10-CM | POA: Diagnosis not present

## 2024-07-01 DIAGNOSIS — N1831 Chronic kidney disease, stage 3a: Secondary | ICD-10-CM | POA: Diagnosis not present

## 2024-07-01 DIAGNOSIS — M810 Age-related osteoporosis without current pathological fracture: Secondary | ICD-10-CM | POA: Diagnosis not present

## 2024-07-01 DIAGNOSIS — F0390 Unspecified dementia without behavioral disturbance: Secondary | ICD-10-CM | POA: Diagnosis not present

## 2024-07-01 DIAGNOSIS — Z7983 Long term (current) use of bisphosphonates: Secondary | ICD-10-CM | POA: Diagnosis not present

## 2024-07-04 DIAGNOSIS — F0390 Unspecified dementia without behavioral disturbance: Secondary | ICD-10-CM | POA: Diagnosis not present

## 2024-07-04 DIAGNOSIS — M25511 Pain in right shoulder: Secondary | ICD-10-CM | POA: Diagnosis not present

## 2024-07-04 DIAGNOSIS — M150 Primary generalized (osteo)arthritis: Secondary | ICD-10-CM | POA: Diagnosis not present

## 2024-07-04 DIAGNOSIS — F0280 Dementia in other diseases classified elsewhere without behavioral disturbance: Secondary | ICD-10-CM | POA: Diagnosis not present

## 2024-07-04 DIAGNOSIS — I509 Heart failure, unspecified: Secondary | ICD-10-CM | POA: Diagnosis not present

## 2024-07-04 DIAGNOSIS — R531 Weakness: Secondary | ICD-10-CM | POA: Diagnosis not present

## 2024-07-05 DIAGNOSIS — F0390 Unspecified dementia without behavioral disturbance: Secondary | ICD-10-CM | POA: Diagnosis not present

## 2024-07-05 DIAGNOSIS — M25511 Pain in right shoulder: Secondary | ICD-10-CM | POA: Diagnosis not present

## 2024-07-05 DIAGNOSIS — R531 Weakness: Secondary | ICD-10-CM | POA: Diagnosis not present

## 2024-07-06 DIAGNOSIS — I509 Heart failure, unspecified: Secondary | ICD-10-CM | POA: Diagnosis not present

## 2024-07-06 DIAGNOSIS — F0280 Dementia in other diseases classified elsewhere without behavioral disturbance: Secondary | ICD-10-CM | POA: Diagnosis not present

## 2024-07-06 DIAGNOSIS — M150 Primary generalized (osteo)arthritis: Secondary | ICD-10-CM | POA: Diagnosis not present

## 2024-07-07 DIAGNOSIS — F0390 Unspecified dementia without behavioral disturbance: Secondary | ICD-10-CM | POA: Diagnosis not present

## 2024-07-07 DIAGNOSIS — M25511 Pain in right shoulder: Secondary | ICD-10-CM | POA: Diagnosis not present

## 2024-07-07 DIAGNOSIS — R531 Weakness: Secondary | ICD-10-CM | POA: Diagnosis not present

## 2024-07-11 DIAGNOSIS — R531 Weakness: Secondary | ICD-10-CM | POA: Diagnosis not present

## 2024-07-11 DIAGNOSIS — F0280 Dementia in other diseases classified elsewhere without behavioral disturbance: Secondary | ICD-10-CM | POA: Diagnosis not present

## 2024-07-11 DIAGNOSIS — M150 Primary generalized (osteo)arthritis: Secondary | ICD-10-CM | POA: Diagnosis not present

## 2024-07-11 DIAGNOSIS — I509 Heart failure, unspecified: Secondary | ICD-10-CM | POA: Diagnosis not present

## 2024-07-11 DIAGNOSIS — F0390 Unspecified dementia without behavioral disturbance: Secondary | ICD-10-CM | POA: Diagnosis not present

## 2024-07-11 DIAGNOSIS — M25511 Pain in right shoulder: Secondary | ICD-10-CM | POA: Diagnosis not present

## 2024-07-12 DIAGNOSIS — M25511 Pain in right shoulder: Secondary | ICD-10-CM | POA: Diagnosis not present

## 2024-07-12 DIAGNOSIS — R531 Weakness: Secondary | ICD-10-CM | POA: Diagnosis not present

## 2024-07-12 DIAGNOSIS — F0390 Unspecified dementia without behavioral disturbance: Secondary | ICD-10-CM | POA: Diagnosis not present

## 2024-07-13 DIAGNOSIS — F0390 Unspecified dementia without behavioral disturbance: Secondary | ICD-10-CM | POA: Diagnosis not present

## 2024-07-13 DIAGNOSIS — M25511 Pain in right shoulder: Secondary | ICD-10-CM | POA: Diagnosis not present

## 2024-07-14 DIAGNOSIS — F0280 Dementia in other diseases classified elsewhere without behavioral disturbance: Secondary | ICD-10-CM | POA: Diagnosis not present

## 2024-07-14 DIAGNOSIS — I509 Heart failure, unspecified: Secondary | ICD-10-CM | POA: Diagnosis not present

## 2024-07-14 DIAGNOSIS — M150 Primary generalized (osteo)arthritis: Secondary | ICD-10-CM | POA: Diagnosis not present

## 2024-07-15 DIAGNOSIS — G894 Chronic pain syndrome: Secondary | ICD-10-CM | POA: Diagnosis not present

## 2024-07-15 DIAGNOSIS — R102 Pelvic and perineal pain unspecified side: Secondary | ICD-10-CM | POA: Diagnosis not present

## 2024-07-15 DIAGNOSIS — Q763 Congenital scoliosis due to congenital bony malformation: Secondary | ICD-10-CM | POA: Diagnosis not present

## 2024-07-19 DIAGNOSIS — F0390 Unspecified dementia without behavioral disturbance: Secondary | ICD-10-CM | POA: Diagnosis not present

## 2024-07-19 DIAGNOSIS — F0280 Dementia in other diseases classified elsewhere without behavioral disturbance: Secondary | ICD-10-CM | POA: Diagnosis not present

## 2024-07-19 DIAGNOSIS — M25511 Pain in right shoulder: Secondary | ICD-10-CM | POA: Diagnosis not present

## 2024-07-19 DIAGNOSIS — M150 Primary generalized (osteo)arthritis: Secondary | ICD-10-CM | POA: Diagnosis not present

## 2024-07-19 DIAGNOSIS — R531 Weakness: Secondary | ICD-10-CM | POA: Diagnosis not present

## 2024-07-19 DIAGNOSIS — I509 Heart failure, unspecified: Secondary | ICD-10-CM | POA: Diagnosis not present

## 2024-07-20 DIAGNOSIS — M150 Primary generalized (osteo)arthritis: Secondary | ICD-10-CM | POA: Diagnosis not present

## 2024-07-20 DIAGNOSIS — F0390 Unspecified dementia without behavioral disturbance: Secondary | ICD-10-CM | POA: Diagnosis not present

## 2024-07-20 DIAGNOSIS — M25511 Pain in right shoulder: Secondary | ICD-10-CM | POA: Diagnosis not present

## 2024-07-20 DIAGNOSIS — R531 Weakness: Secondary | ICD-10-CM | POA: Diagnosis not present

## 2024-07-20 DIAGNOSIS — F0280 Dementia in other diseases classified elsewhere without behavioral disturbance: Secondary | ICD-10-CM | POA: Diagnosis not present

## 2024-07-21 DIAGNOSIS — I509 Heart failure, unspecified: Secondary | ICD-10-CM | POA: Diagnosis not present

## 2024-07-21 DIAGNOSIS — N189 Chronic kidney disease, unspecified: Secondary | ICD-10-CM | POA: Diagnosis not present

## 2024-07-22 DIAGNOSIS — F0390 Unspecified dementia without behavioral disturbance: Secondary | ICD-10-CM | POA: Diagnosis not present

## 2024-07-22 DIAGNOSIS — R531 Weakness: Secondary | ICD-10-CM | POA: Diagnosis not present

## 2024-07-22 DIAGNOSIS — M25511 Pain in right shoulder: Secondary | ICD-10-CM | POA: Diagnosis not present

## 2024-07-25 DIAGNOSIS — I130 Hypertensive heart and chronic kidney disease with heart failure and stage 1 through stage 4 chronic kidney disease, or unspecified chronic kidney disease: Secondary | ICD-10-CM | POA: Diagnosis not present

## 2024-07-25 DIAGNOSIS — I509 Heart failure, unspecified: Secondary | ICD-10-CM | POA: Diagnosis not present

## 2024-07-25 DIAGNOSIS — M150 Primary generalized (osteo)arthritis: Secondary | ICD-10-CM | POA: Diagnosis not present

## 2024-07-25 DIAGNOSIS — F0280 Dementia in other diseases classified elsewhere without behavioral disturbance: Secondary | ICD-10-CM | POA: Diagnosis not present

## 2024-07-25 DIAGNOSIS — F0390 Unspecified dementia without behavioral disturbance: Secondary | ICD-10-CM | POA: Diagnosis not present

## 2024-07-25 DIAGNOSIS — N1831 Chronic kidney disease, stage 3a: Secondary | ICD-10-CM | POA: Diagnosis not present

## 2024-07-25 DIAGNOSIS — Z723 Lack of physical exercise: Secondary | ICD-10-CM | POA: Diagnosis not present

## 2024-07-25 DIAGNOSIS — R531 Weakness: Secondary | ICD-10-CM | POA: Diagnosis not present

## 2024-07-25 DIAGNOSIS — M25511 Pain in right shoulder: Secondary | ICD-10-CM | POA: Diagnosis not present

## 2024-07-26 DIAGNOSIS — M25511 Pain in right shoulder: Secondary | ICD-10-CM | POA: Diagnosis not present

## 2024-07-26 DIAGNOSIS — I509 Heart failure, unspecified: Secondary | ICD-10-CM | POA: Diagnosis not present

## 2024-07-26 DIAGNOSIS — F0280 Dementia in other diseases classified elsewhere without behavioral disturbance: Secondary | ICD-10-CM | POA: Diagnosis not present

## 2024-07-26 DIAGNOSIS — M150 Primary generalized (osteo)arthritis: Secondary | ICD-10-CM | POA: Diagnosis not present

## 2024-07-26 DIAGNOSIS — R531 Weakness: Secondary | ICD-10-CM | POA: Diagnosis not present

## 2024-07-26 DIAGNOSIS — F0390 Unspecified dementia without behavioral disturbance: Secondary | ICD-10-CM | POA: Diagnosis not present

## 2024-07-28 DIAGNOSIS — F0390 Unspecified dementia without behavioral disturbance: Secondary | ICD-10-CM | POA: Diagnosis not present

## 2024-07-28 DIAGNOSIS — R531 Weakness: Secondary | ICD-10-CM | POA: Diagnosis not present

## 2024-07-28 DIAGNOSIS — M25511 Pain in right shoulder: Secondary | ICD-10-CM | POA: Diagnosis not present

## 2024-07-29 DIAGNOSIS — G894 Chronic pain syndrome: Secondary | ICD-10-CM | POA: Diagnosis not present

## 2024-07-29 DIAGNOSIS — N1831 Chronic kidney disease, stage 3a: Secondary | ICD-10-CM | POA: Diagnosis not present

## 2024-07-29 DIAGNOSIS — I130 Hypertensive heart and chronic kidney disease with heart failure and stage 1 through stage 4 chronic kidney disease, or unspecified chronic kidney disease: Secondary | ICD-10-CM | POA: Diagnosis not present

## 2024-07-29 DIAGNOSIS — I509 Heart failure, unspecified: Secondary | ICD-10-CM | POA: Diagnosis not present

## 2024-07-29 DIAGNOSIS — F0390 Unspecified dementia without behavioral disturbance: Secondary | ICD-10-CM | POA: Diagnosis not present

## 2024-07-31 ENCOUNTER — Emergency Department (HOSPITAL_COMMUNITY)

## 2024-07-31 ENCOUNTER — Emergency Department (HOSPITAL_COMMUNITY)
Admission: EM | Admit: 2024-07-31 | Discharge: 2024-07-31 | Disposition: A | Discharge status: Home / Self Care | Source: Skilled Nursing Facility | Attending: Emergency Medicine | Admitting: Emergency Medicine

## 2024-07-31 ENCOUNTER — Encounter (HOSPITAL_COMMUNITY): Payer: Self-pay

## 2024-07-31 ENCOUNTER — Other Ambulatory Visit: Payer: Self-pay

## 2024-07-31 DIAGNOSIS — R55 Syncope and collapse: Secondary | ICD-10-CM | POA: Diagnosis not present

## 2024-07-31 DIAGNOSIS — I509 Heart failure, unspecified: Secondary | ICD-10-CM | POA: Insufficient documentation

## 2024-07-31 DIAGNOSIS — I6782 Cerebral ischemia: Secondary | ICD-10-CM | POA: Diagnosis not present

## 2024-07-31 DIAGNOSIS — F0390 Unspecified dementia without behavioral disturbance: Secondary | ICD-10-CM | POA: Diagnosis not present

## 2024-07-31 DIAGNOSIS — S0990XA Unspecified injury of head, initial encounter: Secondary | ICD-10-CM | POA: Diagnosis not present

## 2024-07-31 DIAGNOSIS — I110 Hypertensive heart disease with heart failure: Secondary | ICD-10-CM | POA: Insufficient documentation

## 2024-07-31 DIAGNOSIS — I482 Chronic atrial fibrillation, unspecified: Secondary | ICD-10-CM | POA: Diagnosis not present

## 2024-07-31 DIAGNOSIS — Z950 Presence of cardiac pacemaker: Secondary | ICD-10-CM | POA: Insufficient documentation

## 2024-07-31 DIAGNOSIS — Z85828 Personal history of other malignant neoplasm of skin: Secondary | ICD-10-CM | POA: Insufficient documentation

## 2024-07-31 DIAGNOSIS — I672 Cerebral atherosclerosis: Secondary | ICD-10-CM | POA: Diagnosis not present

## 2024-07-31 LAB — COMPREHENSIVE METABOLIC PANEL WITH GFR
ALT: 22 U/L (ref 0–44)
AST: 29 U/L (ref 15–41)
Albumin: 3.7 g/dL (ref 3.5–5.0)
Alkaline Phosphatase: 63 U/L (ref 38–126)
Anion gap: 8 (ref 5–15)
BUN: 26 mg/dL — ABNORMAL HIGH (ref 8–23)
CO2: 24 mmol/L (ref 22–32)
Calcium: 9.1 mg/dL (ref 8.9–10.3)
Chloride: 104 mmol/L (ref 98–111)
Creatinine, Ser: 1.18 mg/dL — ABNORMAL HIGH (ref 0.44–1.00)
GFR, Estimated: 48 mL/min — ABNORMAL LOW (ref >60)
Glucose, Bld: 139 mg/dL — ABNORMAL HIGH (ref 70–99)
Potassium: 4.2 mmol/L (ref 3.5–5.1)
Sodium: 136 mmol/L (ref 135–145)
Total Bilirubin: 0.7 mg/dL (ref 0.0–1.2)
Total Protein: 6.6 g/dL (ref 6.5–8.1)

## 2024-07-31 LAB — URINALYSIS, ROUTINE W REFLEX MICROSCOPIC
Bacteria, UA: NONE SEEN
Bilirubin Urine: NEGATIVE
Glucose, UA: >=500 mg/dL — AB
Hgb urine dipstick: NEGATIVE
Ketones, ur: NEGATIVE mg/dL
Leukocytes,Ua: NEGATIVE
Nitrite: NEGATIVE
Protein, ur: NEGATIVE mg/dL
Specific Gravity, Urine: 1.022 (ref 1.005–1.030)
pH: 5 (ref 5.0–8.0)

## 2024-07-31 LAB — CBC
HCT: 40.4 % (ref 36.0–46.0)
Hemoglobin: 12.6 g/dL (ref 12.0–15.0)
MCH: 29.2 pg (ref 26.0–34.0)
MCHC: 31.2 g/dL (ref 30.0–36.0)
MCV: 93.5 fL (ref 80.0–100.0)
Platelets: 289 K/uL (ref 150–400)
RBC: 4.32 MIL/uL (ref 3.87–5.11)
RDW: 14.1 % (ref 11.5–15.5)
WBC: 8.1 K/uL (ref 4.0–10.5)
nRBC: 0 % (ref 0.0–0.2)

## 2024-07-31 LAB — TROPONIN I (HIGH SENSITIVITY)
Troponin I (High Sensitivity): 6 ng/L (ref <18)
Troponin I (High Sensitivity): 4 ng/L (ref <18)

## 2024-07-31 LAB — CBG MONITORING, ED: Glucose-Capillary: 132 mg/dL — ABNORMAL HIGH (ref 70–99)

## 2024-07-31 NOTE — Discharge Instructions (Signed)
 Doris [name]:  Thank you for allowing us  to take care of you today.  We hope you begin feeling better soon. You were seen today for convulsive syncopal episode.  While you are here performed lab work, CT imaging as well as a physical exam.  There is no emergent cause of your symptoms found today.  No evidence that this was cardiac in origin or neurologic in origin.  This is most likely a vasovagal episode.  To-Do:  Please follow-up with your primary doctor within the next 2-3 days. It is important that you review any labs or imaging results (if any) that you had today with them. Your preliminary imaging results (if any) are attached. Please return to the Emergency Department or call 911 if you experience chest pain, shortness of breath, severe pain, severe fever, altered mental status, or have any reason to think that you need emergency medical care.  Thank you again.  Hope you feel better soon.  Department of Emergency Medicine

## 2024-07-31 NOTE — ED Triage Notes (Addendum)
 73 yo female A&O x3 BIB [facility] from [facility]. The patient was reportedly walking to the puzzle room when her legs went weak, eyes rolled back and she slumped over with some twitching noted at 3:15 PM today. [facility] was concerned of a seizure due to the patient shaking which sounded more like twitching when described to EMS. Confusion at baseline per EMS, not oriented to time, oriented to place, oriented to person.   R AC 20 G  Pre- NS vitals:  92/50 BP  145 CBG  HR 86   Post 500 CC NS:  145/84 60 bpm 98% RA

## 2024-07-31 NOTE — ED Provider Notes (Signed)
 [facility] AT [facility] Provider Note  MDM   HPI/ROS:  Doris [name] is a 73 y.o. female with a PMH advanced dementia, CHF, CHB s/p pacemaker placement, HTN, A-fib, BCC who presented to the hospital with reported worsening confusion from her nursing facility  Patient was BIBEMS from the [facility], she was reportedly walking to the puzzle room when her legs went weak, her eyes rolled back and she then slumped over with some twitching noted around 3:15 PM today.  [facility] was concerned of a seizure due to the patient's shaking which then would like twitching when describe AMS, if she is at her mental baseline and oriented to person. Physical exam is notable for: -***  On my initial evaluation, patient is:  -Vital signs stable.*** Patient afebrile***, hemodynamically stable***, and non-toxic appearing.*** -Additional history obtained from ***  This patient's current presentation, including their history and physical exam, is most consistent with ***. Differentials include ***.     Interpretations, interventions, and the patient's course of care are documented below.      ***   Disposition:  {ED Dispo:29898}  Clinical Impression: No diagnosis found.  Rx / DC Orders ED Discharge Orders     None       The plan for this patient was discussed with Dr. ***, who voiced agreement and who oversaw evaluation and treatment of this patient.   Clinical Complexity A medically appropriate history, review of systems, and physical exam was performed.  My independent interpretations of EKG, labs, and radiology are documented in the ED course above.   If decision rules were used in this patient's evaluation, they are listed below.  *** Click here for ABCD2, HEART and other calculatorsREFRESH Note before signing   Patient's presentation is most consistent with {EM COPA:27473}  Medical Decision Making Amount and/or Complexity of Data  Reviewed Labs: ordered. Radiology: ordered.    HPI/ROS      See MDM section for pertinent HPI and ROS. A complete ROS was performed with pertinent positives/negatives noted above.   Past Medical History:  Diagnosis Date   A-fib (HCC)    Cancer (HCC)    Basal cell ( bridge of her nose)    CHF (congestive heart failure) (HCC)    Headache    Migraines   Hx of migraine headaches    Hypertension    Medical history non-contributory    Mitral valve prolapse at 19 yrs of age   Scoliosis     Past Surgical History:  Procedure Laterality Date   CARDIAC CATHETERIZATION     CATARACT EXTRACTION  02/2017   COLONOSCOPY WITH PROPOFOL  N/A 03/03/2017   Procedure: COLONOSCOPY WITH PROPOFOL ;  Surgeon: Vicci Gladis POUR, MD;  Location: [facility];  Service: Endoscopy;  Laterality: N/A;   COLONOSCOPY WITH PROPOFOL  N/A 02/18/2022   Procedure: COLONOSCOPY WITH PROPOFOL ;  Surgeon: Maryruth Ole DASEN, MD;  Location: [facility] ENDOSCOPY;  Service: Endoscopy;  Laterality: N/A;   OOPHORECTOMY  2005      Physical Exam   Vitals:   07/31/24 1820 07/31/24 1825 07/31/24 1830 07/31/24 1836  BP: 132/73     Pulse: 68     Resp:    17  Temp: 97.9 F (36.6 C)     TempSrc: Oral     SpO2:   100%   Weight:  69.9 kg    Height:  5' 3 (1.6 m)      Physical Exam   Procedures   If procedures were  preformed on this patient, they are listed below:  Procedures   @BBSIG @   Please note that this documentation was produced with the assistance of voice-to-text technology and may contain errors.

## 2024-07-31 NOTE — ED Notes (Signed)
 Transport to CT

## 2024-08-02 ENCOUNTER — Ambulatory Visit: Payer: Medicare HMO

## 2024-08-02 DIAGNOSIS — M25511 Pain in right shoulder: Secondary | ICD-10-CM | POA: Diagnosis not present

## 2024-08-02 DIAGNOSIS — I442 Atrioventricular block, complete: Secondary | ICD-10-CM | POA: Diagnosis not present

## 2024-08-02 DIAGNOSIS — F0390 Unspecified dementia without behavioral disturbance: Secondary | ICD-10-CM | POA: Diagnosis not present

## 2024-08-02 DIAGNOSIS — R531 Weakness: Secondary | ICD-10-CM | POA: Diagnosis not present

## 2024-08-02 LAB — CUP PACEART REMOTE DEVICE CHECK
Battery Remaining Longevity: 101 mo
Battery Voltage: 2.99 V
Brady Statistic AP VP Percent: 0.1 %
Brady Statistic AP VS Percent: 20.61 %
Brady Statistic AS VP Percent: 0.02 %
Brady Statistic AS VS Percent: 79.27 %
Brady Statistic RA Percent Paced: 21.06 %
Brady Statistic RV Percent Paced: 0.12 %
Date Time Interrogation Session: 20251111000304
Implantable Lead Connection Status: 753985
Implantable Lead Connection Status: 753985
Implantable Lead Connection Status: 753985
Implantable Lead Implant Date: 20190805
Implantable Lead Implant Date: 20190805
Implantable Lead Implant Date: 20190805
Implantable Lead Location: 753858
Implantable Lead Location: 753859
Implantable Lead Location: 753860
Implantable Lead Model: 4398
Implantable Lead Model: 5076
Implantable Lead Model: 5076
Implantable Lead Serial Number: 11111
Implantable Pulse Generator Implant Date: 20190805
Lead Channel Impedance Value: 1083 Ohm
Lead Channel Impedance Value: 1083 Ohm
Lead Channel Impedance Value: 342 Ohm
Lead Channel Impedance Value: 399 Ohm
Lead Channel Impedance Value: 418 Ohm
Lead Channel Impedance Value: 456 Ohm
Lead Channel Impedance Value: 532 Ohm
Lead Channel Impedance Value: 627 Ohm
Lead Channel Impedance Value: 627 Ohm
Lead Channel Impedance Value: 646 Ohm
Lead Channel Impedance Value: 760 Ohm
Lead Channel Impedance Value: 855 Ohm
Lead Channel Impedance Value: 855 Ohm
Lead Channel Impedance Value: 893 Ohm
Lead Channel Pacing Threshold Amplitude: 0.75 V
Lead Channel Pacing Threshold Amplitude: 1.125 V
Lead Channel Pacing Threshold Amplitude: 1.125 V
Lead Channel Pacing Threshold Pulse Width: 0.4 ms
Lead Channel Pacing Threshold Pulse Width: 0.4 ms
Lead Channel Pacing Threshold Pulse Width: 0.4 ms
Lead Channel Sensing Intrinsic Amplitude: 0.875 mV
Lead Channel Sensing Intrinsic Amplitude: 0.875 mV
Lead Channel Sensing Intrinsic Amplitude: 15.375 mV
Lead Channel Sensing Intrinsic Amplitude: 15.375 mV
Lead Channel Setting Pacing Amplitude: 2 V
Lead Channel Setting Pacing Amplitude: 2.25 V
Lead Channel Setting Pacing Pulse Width: 0.4 ms
Lead Channel Setting Sensing Sensitivity: 2 mV
Zone Setting Status: 755011
Zone Setting Status: 755011

## 2024-08-03 DIAGNOSIS — M25511 Pain in right shoulder: Secondary | ICD-10-CM | POA: Diagnosis not present

## 2024-08-03 DIAGNOSIS — F0390 Unspecified dementia without behavioral disturbance: Secondary | ICD-10-CM | POA: Diagnosis not present

## 2024-08-03 DIAGNOSIS — R531 Weakness: Secondary | ICD-10-CM | POA: Diagnosis not present

## 2024-08-05 NOTE — Progress Notes (Signed)
 Remote ICD Transmission

## 2024-08-07 ENCOUNTER — Ambulatory Visit: Payer: Self-pay | Admitting: Cardiology

## 2024-08-09 DIAGNOSIS — F0280 Dementia in other diseases classified elsewhere without behavioral disturbance: Secondary | ICD-10-CM | POA: Diagnosis not present

## 2024-08-09 DIAGNOSIS — M150 Primary generalized (osteo)arthritis: Secondary | ICD-10-CM | POA: Diagnosis not present

## 2024-08-09 DIAGNOSIS — R2689 Other abnormalities of gait and mobility: Secondary | ICD-10-CM | POA: Diagnosis not present

## 2024-08-09 DIAGNOSIS — I509 Heart failure, unspecified: Secondary | ICD-10-CM | POA: Diagnosis not present

## 2024-08-09 DIAGNOSIS — M25551 Pain in right hip: Secondary | ICD-10-CM | POA: Diagnosis not present

## 2024-08-10 DIAGNOSIS — M25551 Pain in right hip: Secondary | ICD-10-CM | POA: Diagnosis not present

## 2024-08-11 DIAGNOSIS — M25511 Pain in right shoulder: Secondary | ICD-10-CM | POA: Diagnosis not present

## 2024-08-11 DIAGNOSIS — I509 Heart failure, unspecified: Secondary | ICD-10-CM | POA: Diagnosis not present

## 2024-08-11 DIAGNOSIS — N189 Chronic kidney disease, unspecified: Secondary | ICD-10-CM | POA: Diagnosis not present

## 2024-08-11 DIAGNOSIS — F0390 Unspecified dementia without behavioral disturbance: Secondary | ICD-10-CM | POA: Diagnosis not present

## 2024-08-11 DIAGNOSIS — R531 Weakness: Secondary | ICD-10-CM | POA: Diagnosis not present

## 2024-08-12 DIAGNOSIS — M25511 Pain in right shoulder: Secondary | ICD-10-CM | POA: Diagnosis not present

## 2024-08-12 DIAGNOSIS — R531 Weakness: Secondary | ICD-10-CM | POA: Diagnosis not present

## 2024-08-12 DIAGNOSIS — F0390 Unspecified dementia without behavioral disturbance: Secondary | ICD-10-CM | POA: Diagnosis not present

## 2024-08-16 DIAGNOSIS — F0280 Dementia in other diseases classified elsewhere without behavioral disturbance: Secondary | ICD-10-CM | POA: Diagnosis not present

## 2024-08-16 DIAGNOSIS — F0390 Unspecified dementia without behavioral disturbance: Secondary | ICD-10-CM | POA: Diagnosis not present

## 2024-08-16 DIAGNOSIS — R531 Weakness: Secondary | ICD-10-CM | POA: Diagnosis not present

## 2024-08-16 DIAGNOSIS — M25511 Pain in right shoulder: Secondary | ICD-10-CM | POA: Diagnosis not present

## 2024-08-16 DIAGNOSIS — M150 Primary generalized (osteo)arthritis: Secondary | ICD-10-CM | POA: Diagnosis not present

## 2024-08-17 DIAGNOSIS — R531 Weakness: Secondary | ICD-10-CM | POA: Diagnosis not present

## 2024-08-17 DIAGNOSIS — F0390 Unspecified dementia without behavioral disturbance: Secondary | ICD-10-CM | POA: Diagnosis not present

## 2024-08-17 DIAGNOSIS — M25511 Pain in right shoulder: Secondary | ICD-10-CM | POA: Diagnosis not present

## 2024-08-22 DIAGNOSIS — I509 Heart failure, unspecified: Secondary | ICD-10-CM | POA: Diagnosis not present

## 2024-08-22 DIAGNOSIS — M150 Primary generalized (osteo)arthritis: Secondary | ICD-10-CM | POA: Diagnosis not present

## 2024-08-22 DIAGNOSIS — F0280 Dementia in other diseases classified elsewhere without behavioral disturbance: Secondary | ICD-10-CM | POA: Diagnosis not present

## 2024-08-23 DIAGNOSIS — F0390 Unspecified dementia without behavioral disturbance: Secondary | ICD-10-CM | POA: Diagnosis not present

## 2024-08-23 DIAGNOSIS — M25511 Pain in right shoulder: Secondary | ICD-10-CM | POA: Diagnosis not present

## 2024-08-23 DIAGNOSIS — R531 Weakness: Secondary | ICD-10-CM | POA: Diagnosis not present

## 2024-08-25 DIAGNOSIS — I509 Heart failure, unspecified: Secondary | ICD-10-CM | POA: Diagnosis not present

## 2024-08-25 DIAGNOSIS — I130 Hypertensive heart and chronic kidney disease with heart failure and stage 1 through stage 4 chronic kidney disease, or unspecified chronic kidney disease: Secondary | ICD-10-CM | POA: Diagnosis not present

## 2024-08-25 DIAGNOSIS — Z7983 Long term (current) use of bisphosphonates: Secondary | ICD-10-CM | POA: Diagnosis not present

## 2024-08-25 DIAGNOSIS — M810 Age-related osteoporosis without current pathological fracture: Secondary | ICD-10-CM | POA: Diagnosis not present

## 2024-08-25 DIAGNOSIS — F0390 Unspecified dementia without behavioral disturbance: Secondary | ICD-10-CM | POA: Diagnosis not present

## 2024-08-25 DIAGNOSIS — N1831 Chronic kidney disease, stage 3a: Secondary | ICD-10-CM | POA: Diagnosis not present

## 2024-08-29 DIAGNOSIS — M150 Primary generalized (osteo)arthritis: Secondary | ICD-10-CM | POA: Diagnosis not present

## 2024-08-29 DIAGNOSIS — I509 Heart failure, unspecified: Secondary | ICD-10-CM | POA: Diagnosis not present

## 2024-08-29 DIAGNOSIS — F0280 Dementia in other diseases classified elsewhere without behavioral disturbance: Secondary | ICD-10-CM | POA: Diagnosis not present

## 2024-09-01 DIAGNOSIS — R531 Weakness: Secondary | ICD-10-CM | POA: Diagnosis not present

## 2024-09-01 DIAGNOSIS — F0390 Unspecified dementia without behavioral disturbance: Secondary | ICD-10-CM | POA: Diagnosis not present

## 2024-09-01 DIAGNOSIS — M25511 Pain in right shoulder: Secondary | ICD-10-CM | POA: Diagnosis not present

## 2024-09-02 DIAGNOSIS — F0390 Unspecified dementia without behavioral disturbance: Secondary | ICD-10-CM | POA: Diagnosis not present

## 2024-09-02 DIAGNOSIS — M25511 Pain in right shoulder: Secondary | ICD-10-CM | POA: Diagnosis not present

## 2024-09-02 DIAGNOSIS — R531 Weakness: Secondary | ICD-10-CM | POA: Diagnosis not present

## 2024-09-05 DIAGNOSIS — M150 Primary generalized (osteo)arthritis: Secondary | ICD-10-CM | POA: Diagnosis not present

## 2024-09-05 DIAGNOSIS — I509 Heart failure, unspecified: Secondary | ICD-10-CM | POA: Diagnosis not present

## 2024-09-05 DIAGNOSIS — F0280 Dementia in other diseases classified elsewhere without behavioral disturbance: Secondary | ICD-10-CM | POA: Diagnosis not present

## 2024-09-07 DIAGNOSIS — M25511 Pain in right shoulder: Secondary | ICD-10-CM | POA: Diagnosis not present

## 2024-09-07 DIAGNOSIS — R531 Weakness: Secondary | ICD-10-CM | POA: Diagnosis not present

## 2024-09-07 DIAGNOSIS — F0390 Unspecified dementia without behavioral disturbance: Secondary | ICD-10-CM | POA: Diagnosis not present

## 2024-09-09 DIAGNOSIS — Q763 Congenital scoliosis due to congenital bony malformation: Secondary | ICD-10-CM | POA: Diagnosis not present

## 2024-09-09 DIAGNOSIS — G894 Chronic pain syndrome: Secondary | ICD-10-CM | POA: Diagnosis not present

## 2024-09-09 DIAGNOSIS — R102 Pelvic and perineal pain unspecified side: Secondary | ICD-10-CM | POA: Diagnosis not present

## 2024-09-13 DIAGNOSIS — I509 Heart failure, unspecified: Secondary | ICD-10-CM | POA: Diagnosis not present

## 2024-09-13 DIAGNOSIS — F0280 Dementia in other diseases classified elsewhere without behavioral disturbance: Secondary | ICD-10-CM | POA: Diagnosis not present

## 2024-09-13 DIAGNOSIS — M25511 Pain in right shoulder: Secondary | ICD-10-CM | POA: Diagnosis not present

## 2024-09-13 DIAGNOSIS — R531 Weakness: Secondary | ICD-10-CM | POA: Diagnosis not present

## 2024-09-13 DIAGNOSIS — M150 Primary generalized (osteo)arthritis: Secondary | ICD-10-CM | POA: Diagnosis not present

## 2024-09-13 DIAGNOSIS — F0390 Unspecified dementia without behavioral disturbance: Secondary | ICD-10-CM | POA: Diagnosis not present

## 2024-11-01 ENCOUNTER — Ambulatory Visit: Payer: Medicare HMO

## 2025-01-31 ENCOUNTER — Ambulatory Visit

## 2025-05-02 ENCOUNTER — Ambulatory Visit

## 2025-08-01 ENCOUNTER — Ambulatory Visit

## 2025-10-31 ENCOUNTER — Ambulatory Visit

## 2026-01-30 ENCOUNTER — Ambulatory Visit

## 2026-05-01 ENCOUNTER — Ambulatory Visit

## 2026-07-31 ENCOUNTER — Ambulatory Visit

## 2026-10-30 ENCOUNTER — Ambulatory Visit
